# Patient Record
Sex: Female | Born: 1935 | Race: Black or African American | Hispanic: No | Smoking: Former smoker
Health system: Southern US, Community
[De-identification: ages and names within clinical notes are randomized; demographics above are authoritative.]

## PROBLEM LIST (undated history)

## (undated) DIAGNOSIS — Z95818 Presence of other cardiac implants and grafts: Secondary | ICD-10-CM

## (undated) DIAGNOSIS — Z9889 Other specified postprocedural states: Secondary | ICD-10-CM

## (undated) DIAGNOSIS — I5022 Chronic systolic (congestive) heart failure: Secondary | ICD-10-CM

## (undated) DIAGNOSIS — Z952 Presence of prosthetic heart valve: Secondary | ICD-10-CM

## (undated) DIAGNOSIS — I4891 Unspecified atrial fibrillation: Secondary | ICD-10-CM

## (undated) DIAGNOSIS — I2510 Atherosclerotic heart disease of native coronary artery without angina pectoris: Secondary | ICD-10-CM

## (undated) HISTORY — PX: AORTIC VALVE REPLACEMENT: SHX41

## (undated) HISTORY — DX: Atherosclerotic heart disease of native coronary artery without angina pectoris: I25.10

## (undated) HISTORY — PX: MITRAL VALVE REPAIR: SHX2039

## (undated) HISTORY — PX: CORONARY ARTERY BYPASS GRAFT: SHX141

---

## 2005-05-12 ENCOUNTER — Ambulatory Visit: Payer: Self-pay | Admitting: Family Medicine

## 2005-09-05 ENCOUNTER — Ambulatory Visit: Payer: Self-pay | Admitting: Family Medicine

## 2010-06-23 ENCOUNTER — Ambulatory Visit: Payer: Self-pay | Admitting: Podiatry

## 2012-11-24 ENCOUNTER — Emergency Department: Payer: Self-pay | Admitting: Emergency Medicine

## 2013-12-15 DIAGNOSIS — IMO0002 Reserved for concepts with insufficient information to code with codable children: Secondary | ICD-10-CM | POA: Insufficient documentation

## 2014-02-18 DIAGNOSIS — D649 Anemia, unspecified: Secondary | ICD-10-CM | POA: Insufficient documentation

## 2014-10-19 DIAGNOSIS — M7050 Other bursitis of knee, unspecified knee: Secondary | ICD-10-CM | POA: Insufficient documentation

## 2015-04-25 DIAGNOSIS — I340 Nonrheumatic mitral (valve) insufficiency: Secondary | ICD-10-CM | POA: Insufficient documentation

## 2015-06-17 ENCOUNTER — Encounter: Payer: Self-pay | Admitting: *Deleted

## 2015-06-17 ENCOUNTER — Encounter: Payer: Medicare Other | Attending: Internal Medicine | Admitting: *Deleted

## 2015-06-17 VITALS — Ht 68.0 in | Wt 149.2 lb

## 2015-06-17 DIAGNOSIS — I4891 Unspecified atrial fibrillation: Secondary | ICD-10-CM | POA: Insufficient documentation

## 2015-06-17 DIAGNOSIS — K089 Disorder of teeth and supporting structures, unspecified: Secondary | ICD-10-CM | POA: Insufficient documentation

## 2015-06-17 DIAGNOSIS — Z9889 Other specified postprocedural states: Secondary | ICD-10-CM | POA: Diagnosis not present

## 2015-06-17 NOTE — Patient Instructions (Signed)
Patient Instructions  Patient Details  Name: Cindy Weaver MRN: [id number] Date of Birth: Mar 24, 1926 Referring Provider:  Clyda Greener, MD  Below are the personal goals you chose as well as exercise and nutrition goals. Our goal is to help you keep on track towards obtaining and maintaining your goals. We will be discussing your progress on these goals with you throughout the program.  Initial Exercise Prescription:     Initial Exercise Prescription - 06/17/15 1500    Date of Initial Exercise Prescription   Date 06/17/15   Treadmill   MPH 1.2   Grade 0   Minutes 10   Recumbant Bike   Level 1   RPM 40   Watts 20   Minutes 10   NuStep   Level 2   Watts 40   Minutes 10   Arm Ergometer   Level 1   Watts 10   Minutes 10   REL-XR   Level 2   Watts 40   Minutes 10   Prescription Details   Frequency (times per week) 3   Duration Progress to 30 minutes of continuous aerobic without signs/symptoms of physical distress   Intensity   THRR REST +  20   Ratings of Perceived Exertion 11-15   Perceived Dyspnea 2-4   Progression Continue progressive overload as per policy without signs/symptoms or physical distress.   Resistance Training   Training Prescription Yes   Weight 2   Reps 10-15      Exercise Goals: Frequency: Be able to perform aerobic exercise three times per week working toward 3-5 days per week.  Intensity: Work with a perceived exertion of 11 (fairly light) - 15 (hard) as tolerated. Follow your new exercise prescription and watch for changes in prescription as you progress with the program. Changes will be reviewed with you when they are made.  Duration: You should be able to do 30 minutes of continuous aerobic exercise in addition to a 5 minute warm-up and a 5 minute cool-down routine.  Nutrition Goals: Your personal nutrition goals will be established when you do your nutrition analysis with the dietician.  The following are nutrition guidelines to  follow: Cholesterol < /day Sodium < /day Fiber: Women over 50 yrs - 21 grams per day  Personal Goals:     Personal Goals and Risk Factors at Admission - 06/17/15 1511    Personal Goals and Risk Factors on Admission   Increase Aerobic Exercise and Physical Activity Yes   Intervention While in program, learn and follow the exercise prescription taught. Start at a low level workload and increase workload after able to maintain previous level for 30 minutes. Increase time before increasing intensity.   Diabetes No   Hypertension Yes   Goal Participant will see blood pressure controlled within the values of 140/71mm/Hg or within value directed by their physician.   Intervention Provide nutrition & aerobic exercise along with prescribed medications to achieve BP 140/90 or less.   Lipids Yes   Goal Cholesterol controlled with medications as prescribed, with individualized exercise RX and with personalized nutrition plan. Value goals: LDL < , HDL > . Participant states understanding of desired cholesterol values and following prescriptions.   Intervention Provide nutrition & aerobic exercise along with prescribed medications to achieve LDL 70mg , HDL >40mg .      Tobacco Use Initial Evaluation: History  Smoking status  . Not on file  Smokeless tobacco  . Not on file    Copy of goals  given to participant.

## 2015-06-17 NOTE — Progress Notes (Signed)
Cardiac Individual Treatment Plan  Patient Details  Name: Cindy Weaver MRN: [id number] Date of Birth: 04-22-1926 Referring Provider:  Clyda Greener, MD  Initial Encounter Date: Date: 06/17/15  Visit Diagnosis: S/P mitral valve repair  Patient's Home Medications on Admission:  Current outpatient prescriptions:  .  aspirin 81 MG chewable tablet, Chew 81 mg by mouth., Disp: , Rfl:  .  furosemide (LASIX) 40 MG tablet, Take 80 mg by mouth., Disp: , Rfl:  .  losartan (COZAAR) 25 MG tablet, Take 25 mg by mouth., Disp: , Rfl:  .  metoprolol tartrate (LOPRESSOR) 25 MG tablet, 50mg  (2 tabs) twice daily.  Please dispense 25mg  tabs instead of 50mg  tabs, Disp: , Rfl:  .  potassium chloride (K-DUR) 10 MEQ tablet, Take by mouth., Disp: , Rfl:  .  triamcinolone cream (KENALOG) 0.1 %, , Disp: , Rfl:  .  warfarin (COUMADIN) 2.5 MG tablet, 3.75mg  (1.5 tabs) Mon, Wed, Fri and 2.5mg  (1 tab) other days or as directed  **Brand Coumadin, Disp: , Rfl:  .  Multiple Vitamins-Minerals (MULTIVITAMIN WITH MINERALS) tablet, Take by mouth., Disp: , Rfl:   Past Medical History: No past medical history on file.  Tobacco Use: History  Smoking status  . Not on file  Smokeless tobacco  . Not on file    Labs: Recent Review Flowsheet Data    There is no flowsheet data to display.       Exercise Target Goals: Date: 06/17/15  Exercise Program Goal: Individual exercise prescription set with THRR, safety & activity barriers. Participant demonstrates ability to understand and report RPE using BORG scale, to self-measure pulse accurately, and to acknowledge the importance of the exercise prescription.  Exercise Prescription Goal: Starting with aerobic activity 30 plus minutes a day, 3 days per week for initial exercise prescription. Provide home exercise prescription and guidelines that participant acknowledges understanding prior to discharge.  Activity Barriers & Risk Stratification:     Activity  Barriers & Risk Stratification - 06/17/15 1506    Activity Barriers & Risk Stratification   Activity Barriers Arthritis   Risk Stratification High      6 Minute Walk:     6 Minute Walk      06/17/15 1500       6 Minute Walk   Phase Initial     Distance 845 feet     Walk Time 6 minutes     Resting HR 95 bpm     Resting BP 112/70 mmHg     Max Ex. HR 105 bpm     Max Ex. BP 128/68 mmHg     RPE 13     Symptoms No        Initial Exercise Prescription:     Initial Exercise Prescription - 06/17/15 1500    Date of Initial Exercise Prescription   Date 06/17/15   Treadmill   MPH 1.2   Grade 0   Minutes 10   Recumbant Bike   Level 1   RPM 40   Watts 20   Minutes 10   NuStep   Level 2   Watts 40   Minutes 10   Arm Ergometer   Level 1   Watts 10   Minutes 10   REL-XR   Level 2   Watts 40   Minutes 10   Prescription Details   Frequency (times per week) 3   Duration Progress to 30 minutes of continuous aerobic without signs/symptoms of physical distress   Intensity  THRR REST +  20   Ratings of Perceived Exertion 11-15   Perceived Dyspnea 2-4   Progression Continue progressive overload as per policy without signs/symptoms or physical distress.   Resistance Training   Training Prescription Yes   Weight 2   Reps 10-15      Exercise Prescription Changes:   Discharge Exercise Prescription (Final Exercise Prescription Changes):   Nutrition:  Target Goals: Understanding of nutrition guidelines, daily intake of sodium 1500mg , cholesterol 200mg , calories 30% from fat and 7% or less from saturated fats, daily to have 5 or more servings of fruits and vegetables.  Biometrics:     Pre Biometrics - 06/17/15 1503    Pre Biometrics   Height 5\' 8"  (1.727 m)   Weight 149 lb 3.2 oz (67.677 kg)   Waist Circumference 35 inches   Hip Circumference 40 inches   Waist to Hip Ratio 0.88 %   BMI (Calculated) 22.7       Nutrition Therapy Plan and Nutrition  Goals:     Nutrition Therapy & Goals - 06/17/15 1509    Nutrition Therapy   Drug/Food Interactions Coumadin/Vit K  Lives alone. States money is not an issue but she is just not hungry much.  Has some ensure her daughter who lives out of state bought her.    Intervention Plan   Intervention Using nutrition plan and personal goals to gain a healthy nutrition lifestyle. Add exercise as prescribed.      Nutrition Discharge: Rate Your Plate Scores:   Nutrition Goals Re-Evaluation:   Psychosocial: Target Goals: Acknowledge presence or absence of depression, maximize coping skills, provide positive support system. Participant is able to verbalize types and ability to use techniques and skills needed for reducing stress and depression.  Initial Review & Psychosocial Screening:   Quality of Life Scores:   PHQ-9:     Recent Review Flowsheet Data    Depression screen [facility] 2/9 06/17/2015   Decreased Interest 1   Down, Depressed, Hopeless 0   PHQ - 2 Score 1   Altered sleeping 1   Tired, decreased energy 3   Change in appetite 2   Feeling bad or failure about yourself  0   Trouble concentrating 0   Moving slowly or fidgety/restless 0   Suicidal thoughts 0   PHQ-9 Score 7   Difficult doing work/chores Not difficult at all      Psychosocial Evaluation and Intervention:   Psychosocial Re-Evaluation:   Vocational Rehabilitation: Provide vocational rehab assistance to qualifying candidates.   Vocational Rehab Evaluation & Intervention:     Vocational Rehab - 06/17/15 1507    Initial Vocational Rehab Evaluation & Intervention   Assessment shows need for Vocational Rehabilitation No      Education: Education Goals: Education classes will be provided on a weekly basis, covering required topics. Participant will state understanding/return demonstration of topics presented.  Learning Barriers/Preferences:     Learning Barriers/Preferences - 06/17/15 1506    Learning  Barriers/Preferences   Learning Barriers None   Learning Preferences None      Education Topics: General Nutrition Guidelines/Fats and Fiber: -Group instruction provided by verbal, written material, models and posters to present the general guidelines for heart healthy nutrition. Gives an explanation and review of dietary fats and fiber.   Controlling Sodium/Reading Food Labels: -Group verbal and written material supporting the discussion of sodium use in heart healthy nutrition. Review and explanation with models, verbal and written materials for utilization of the food label.  Exercise Physiology & Risk Factors: - Group verbal and written instruction with models to review the exercise physiology of the cardiovascular system and associated critical values. Details cardiovascular disease risk factors and the goals associated with each risk factor.   Aerobic Exercise & Resistance Training: - Gives group verbal and written discussion on the health impact of inactivity. On the components of aerobic and resistive training programs and the benefits of this training and how to safely progress through these programs.   Flexibility, Balance, General Exercise Guidelines: - Provides group verbal and written instruction on the benefits of flexibility and balance training programs. Provides general exercise guidelines with specific guidelines to those with heart or lung disease. Demonstration and skill practice provided.   Stress Management: - Provides group verbal and written instruction about the health risks of elevated stress, cause of high stress, and healthy ways to reduce stress.   Depression: - Provides group verbal and written instruction on the correlation between heart/lung disease and depressed mood, treatment options, and the stigmas associated with seeking treatment.   Anatomy & Physiology of the Heart: - Group verbal and written instruction and models provide basic cardiac  anatomy and physiology, with the coronary electrical and arterial systems. Review of: AMI, Angina, Valve disease, Heart Failure, Cardiac Arrhythmia, Pacemakers, and the ICD.   Cardiac Procedures: - Group verbal and written instruction and models to describe the testing methods done to diagnose heart disease. Reviews the outcomes of the test results. Describes the treatment choices: Medical Management, Angioplasty, or Coronary Bypass Surgery.   Cardiac Medications: - Group verbal and written instruction to review commonly prescribed medications for heart disease. Reviews the medication, class of the drug, and side effects. Includes the steps to properly store meds and maintain the prescription regimen.   Go Sex-Intimacy & Heart Disease, Get SMART - Goal Setting: - Group verbal and written instruction through game format to discuss heart disease and the return to sexual intimacy. Provides group verbal and written material to discuss and apply goal setting through the application of the S.M.A.R.T. Method.   Other Matters of the Heart: - Provides group verbal, written materials and models to describe Heart Failure, Angina, Valve Disease, and Diabetes in the realm of heart disease. Includes description of the disease process and treatment options available to the cardiac patient.   Exercise & Equipment Safety: - Individual verbal instruction and demonstration of equipment use and safety with use of the equipment.          Cardiac Rehab from 06/17/2015 in [facility] Cardiac Rehab   Date  06/17/15   Educator  C. Ayva Veilleux,RN   Instruction Review Code  2- meets goals/outcomes      Infection Prevention: - Provides verbal and written material to individual with discussion of infection control including proper hand washing and proper equipment cleaning during exercise session.      Cardiac Rehab from 06/17/2015 in [facility] Cardiac Rehab   Date  06/17/15   Educator  C. Nohea Kras,RN   Instruction Review Code   2- meets goals/outcomes      Falls Prevention: - Provides verbal and written material to individual with discussion of falls prevention and safety.      Cardiac Rehab from 06/17/2015 in [facility] Cardiac Rehab   Date  06/17/15   Educator  C. EnterkinRN   Instruction Review Code  2- meets goals/outcomes      Diabetes: - Individual verbal and written instruction to review signs/symptoms of diabetes, desired ranges of glucose level fasting,  after meals and with exercise. Advice that pre and post exercise glucose checks will be done for 3 sessions at entry of program.    Knowledge Questionnaire Score:     Knowledge Questionnaire Score - 06/17/15 1506    Knowledge Questionnaire Score   Pre Score 20      Personal Goals and Risk Factors at Admission:     Personal Goals and Risk Factors at Admission - 06/17/15 1511    Personal Goals and Risk Factors on Admission   Increase Aerobic Exercise and Physical Activity Yes   Intervention While in program, learn and follow the exercise prescription taught. Start at a low level workload and increase workload after able to maintain previous level for 30 minutes. Increase time before increasing intensity.   Diabetes No   Hypertension Yes   Goal Participant will see blood pressure controlled within the values of 140/41mm/Hg or within value directed by their physician.   Intervention Provide nutrition & aerobic exercise along with prescribed medications to achieve BP 140/90 or less.   Lipids Yes   Goal Cholesterol controlled with medications as prescribed, with individualized exercise RX and with personalized nutrition plan. Value goals: LDL < 70mg , HDL > 40mg . Participant states understanding of desired cholesterol values and following prescriptions.   Intervention Provide nutrition & aerobic exercise along with prescribed medications to achieve LDL 70mg , HDL >40mg .      Personal Goals and Risk Factors Review:    Personal Goals Discharge:      Comments: Ready to get her strength back. Walks with no walker or cane. Lives by herself. Her 2 daughters live out of state.

## 2015-06-23 ENCOUNTER — Encounter: Payer: Medicare Other | Admitting: *Deleted

## 2015-06-23 DIAGNOSIS — Z9889 Other specified postprocedural states: Secondary | ICD-10-CM | POA: Diagnosis not present

## 2015-06-23 NOTE — Progress Notes (Signed)
Daily Session Note  Patient Details  Name: Cindy Weaver MRN: [id number] Date of Birth: [birth year] Referring Provider:  Alison Stalling, MD  Encounter Date: 06/23/2015  Check In:     Session Check In - 06/23/15 1613    Check-In   Staff Present Heath Lark RN, BSN, CCRP;Renee Dillard Essex MS, ACSM CEP Exercise Physiologist;Diane Mariana Arn, BSN   ER physicians immediately available to respond to emergencies See telemetry face sheet for immediately available ER MD   Medication changes reported     No   Fall or balance concerns reported    No   Warm-up and Cool-down Performed on first and last piece of equipment   VAD Patient? No   Pain Assessment   Currently in Pain? No/denies         Goals Met:  Proper associated with RPD/PD & O2 Sat Exercise tolerated well No report of cardiac concerns or symptoms Strength training completed today  Goals Unmet:  Not Applicable  Goals Comments:    Dr. Emily Filbert is Medical Director for [place] [facility] and [facility].

## 2015-06-24 DIAGNOSIS — Z9889 Other specified postprocedural states: Secondary | ICD-10-CM

## 2015-06-24 NOTE — Progress Notes (Signed)
Daily Session Note  Patient Details  Name: Cindy Weaver MRN: [id number] Date of Birth: Sep 06, 1926 Referring Provider:  Alison Stalling, MD  Encounter Date: 06/24/2015  Check In:     Session Check In - 06/24/15 1633    Check-In   Staff Present Nyoka Cowden RN;Diane Joya Gaskins RN, BSN;Sherhonda Gaspar BS, ACSM EP-C, Exercise Physiologist   ER physicians immediately available to respond to emergencies See telemetry face sheet for immediately available ER MD   Medication changes reported     No   Fall or balance concerns reported    No   Warm-up and Cool-down Performed on first and last piece of equipment   VAD Patient? No   Pain Assessment   Currently in Pain? No/denies         Goals Met:  Proper associated with RPD/PD & O2 Sat Exercise tolerated well No report of cardiac concerns or symptoms Strength training completed today  Goals Unmet:  Not Applicable  Goals Comments:    Dr. Emily Filbert is Medical Director for [place] and [facility].

## 2015-06-28 ENCOUNTER — Encounter: Payer: Self-pay | Admitting: *Deleted

## 2015-06-28 ENCOUNTER — Encounter: Payer: Medicare Other | Admitting: *Deleted

## 2015-06-28 DIAGNOSIS — Z9889 Other specified postprocedural states: Secondary | ICD-10-CM | POA: Diagnosis not present

## 2015-06-28 NOTE — Progress Notes (Signed)
Daily Session Note  Patient Details  Name: Cindy Weaver MRN: [id number] Date of Birth: 06/01/1926 Referring Provider:  Vavalle, John P, MD  Encounter Date: 06/28/2015  Check In:     Session Check In - 06/28/15 1610    Check-In   Staff Present Susanne Bice RN, BSN, CCRP;Carroll Enterkin RN, BSN;Steven Way BS, ACSM EP-C, Exercise Physiologist   ER physicians immediately available to respond to emergencies See telemetry face sheet for immediately available ER MD   Medication changes reported     No   Fall or balance concerns reported    No   Warm-up and Cool-down Performed on first and last piece of equipment   VAD Patient? No   Pain Assessment   Currently in Pain? No/denies           Exercise Prescription Changes - 06/28/15 0600    Exercise Review   Progression Yes   Response to Exercise   Blood Pressure (Admit) 126/62 mmHg   Blood Pressure (Exercise) 136/72 mmHg   Blood Pressure (Exit) 140/70 mmHg   Heart Rate (Admit) 82 bpm   Heart Rate (Exercise) 95 bpm   Heart Rate (Exit) 74 bpm   Rating of Perceived Exertion (Exercise) 13   Duration Progress to 30 minutes of continuous aerobic without signs/symptoms of physical distress   Intensity Rest + 30   Progression Continue progressive overload as per policy without signs/symptoms or physical distress.   Resistance Training   Training Prescription Yes   Weight 2   Reps 10-15   Interval Training   Interval Training No   NuStep   Level 3   Watts 30   Minutes 10   REL-XR   Level 2   Watts 30   Minutes 15      Goals Met:  Exercise tolerated well No report of cardiac concerns or symptoms  Goals Unmet:  Not Applicable  Goals Comments: Cindy Weaver is doing  well with exercise prescription progression. She has made several changes in her daily nutrition plan, adding more fruits/vegs, switching to brown bread and stopping sodas.    Dr. Mark Miller is Medical Director for [facility] and  [facility]. 

## 2015-06-30 ENCOUNTER — Encounter: Payer: Self-pay | Admitting: *Deleted

## 2015-06-30 ENCOUNTER — Encounter: Payer: Medicare Other | Admitting: *Deleted

## 2015-06-30 DIAGNOSIS — Z9889 Other specified postprocedural states: Secondary | ICD-10-CM

## 2015-06-30 NOTE — Progress Notes (Signed)
Cardiac Individual Treatment Plan  Patient Details  Name: Cindy Weaver MRN: [id number] Date of Birth: [birth year] Referring Provider:  Alison Stalling, MD  Initial Encounter Date:    Visit Diagnosis: S/P mitral valve repair  Patient's Home Medications on Admission:  Current outpatient prescriptions:  .  aspirin 81 MG chewable tablet, Chew 81 mg by mouth., Disp: , Rfl:  .  furosemide (LASIX) 40 MG tablet, Take 80 mg by mouth., Disp: , Rfl:  .  losartan (COZAAR) 25 MG tablet, Take 25 mg by mouth., Disp: , Rfl:  .  metoprolol tartrate (LOPRESSOR) 25 MG tablet, 10m (2 tabs) twice daily.  Please dispense 211mtabs instead of 5028mabs, Disp: , Rfl:  .  Multiple Vitamins-Minerals (MULTIVITAMIN WITH MINERALS) tablet, Take by mouth., Disp: , Rfl:  .  potassium chloride (K-DUR) 10 MEQ tablet, Take by mouth., Disp: , Rfl:  .  triamcinolone cream (KENALOG) 0.1 %, , Disp: , Rfl:  .  warfarin (COUMADIN) 2.5 MG tablet, 3.39m103m.5 tabs) Mon, Wed, Fri and 2.5mg 82mtab) other days or as directed  **Brand Coumadin, Disp: , Rfl:   Past Medical History: Past Medical History  Diagnosis Date  . CHF (congestive heart failure)   . Coronary artery disease   . Hypertension     Tobacco Use: History  Smoking status  . Former Smoker -- 1.00 packs/day for 25 years  . Quit date: 10/16/1993  Smokeless tobacco  . Not on file    Labs: Recent Review Flowsheet Data    There is no flowsheet data to display.       Exercise Target Goals:    Exercise Program Goal: Individual exercise prescription set with THRR, safety & activity barriers. Participant demonstrates ability to understand and report RPE using BORG scale, to self-measure pulse accurately, and to acknowledge the importance of the exercise prescription.  Exercise Prescription Goal: Starting with aerobic activity 30 plus minutes a day, 3 days per week for initial exercise prescription. Provide home exercise prescription and guidelines that  participant acknowledges understanding prior to discharge.  Activity Barriers & Risk Stratification:     Activity Barriers & Risk Stratification - 06/17/15 1506    Activity Barriers & Risk Stratification   Activity Barriers Arthritis   Risk Stratification High      6 Minute Walk:     6 Minute Walk      06/17/15 1500       6 Minute Walk   Phase Initial     Distance 845 feet     Walk Time 6 minutes     Resting HR 95 bpm     Resting BP 112/70 mmHg     Max Ex. HR 105 bpm     Max Ex. BP 128/68 mmHg     RPE 13     Symptoms No        Initial Exercise Prescription:     Initial Exercise Prescription - 06/17/15 1500    Date of Initial Exercise Prescription   Date 06/17/15   Treadmill   MPH 1.2   Grade 0   Minutes 10   Recumbant Bike   Level 1   RPM 40   Watts 20   Minutes 10   NuStep   Level 2   Watts 40   Minutes 10   Arm Ergometer   Level 1   Watts 10   Minutes 10   REL-XR   Level 2   Watts 40   Minutes 10  Prescription Details   Frequency (times per week) 3   Duration Progress to 30 minutes of continuous aerobic without signs/symptoms of physical distress   Intensity   THRR REST +  20   Ratings of Perceived Exertion 11-15   Perceived Dyspnea 2-4   Progression Continue progressive overload as per policy without signs/symptoms or physical distress.   Resistance Training   Training Prescription Yes   Weight 2   Reps 10-15      Exercise Prescription Changes:     Exercise Prescription Changes      06/28/15 0600           Exercise Review   Progression Yes       Response to Exercise   Blood Pressure (Admit) 126/62 mmHg       Blood Pressure (Exercise) 136/72 mmHg       Blood Pressure (Exit) 140/70 mmHg       Heart Rate (Admit) 82 bpm       Heart Rate (Exercise) 95 bpm       Heart Rate (Exit) 74 bpm       Rating of Perceived Exertion (Exercise) 13       Duration Progress to 30 minutes of continuous aerobic without signs/symptoms of  physical distress       Intensity Rest + 30       Progression Continue progressive overload as per policy without signs/symptoms or physical distress.       Resistance Training   Training Prescription Yes       Weight 2       Reps 10-15       Interval Training   Interval Training No       NuStep   Level 3       Watts 30       Minutes 10       REL-XR   Level 2       Watts 30       Minutes 15          Discharge Exercise Prescription (Final Exercise Prescription Changes):     Exercise Prescription Changes - 06/28/15 0600    Exercise Review   Progression Yes   Response to Exercise   Blood Pressure (Admit) 126/62 mmHg   Blood Pressure (Exercise) 136/72 mmHg   Blood Pressure (Exit) 140/70 mmHg   Heart Rate (Admit) 82 bpm   Heart Rate (Exercise) 95 bpm   Heart Rate (Exit) 74 bpm   Rating of Perceived Exertion (Exercise) 13   Duration Progress to 30 minutes of continuous aerobic without signs/symptoms of physical distress   Intensity Rest + 30   Progression Continue progressive overload as per policy without signs/symptoms or physical distress.   Resistance Training   Training Prescription Yes   Weight 2   Reps 10-15   Interval Training   Interval Training No   NuStep   Level 3   Watts 30   Minutes 10   REL-XR   Level 2   Watts 30   Minutes 15      Nutrition:  Target Goals: Understanding of nutrition guidelines, daily intake of sodium <155m, cholesterol <2029m calories 30% from fat and 7% or less from saturated fats, daily to have 5 or more servings of fruits and vegetables.  Biometrics:     Pre Biometrics - 06/17/15 1503    Pre Biometrics   Height 5' 8"  (1.727 m)   Weight 149 lb 3.2 oz (67.677 kg)   Waist  Circumference 35 inches   Hip Circumference 40 inches   Waist to Hip Ratio 0.88 %   BMI (Calculated) 22.7       Nutrition Therapy Plan and Nutrition Goals:     Nutrition Therapy & Goals - 06/25/15 1050    Nutrition Therapy   Diet basic diet  for poor appetite, prevent further weight loss   Drug/Food Interactions Coumadin/Vit K   Personal Nutrition Goals   Personal Goal #1 Try 2% or "light" cheese, or low fat cream cheese (for protein)   Personal Goal #2 Can also try Premier Protein drinks or Carnation breakfast drinks   Personal Goal #3 Use peanut butter as another way to take in protein   Personal Goal #4 Eat something - or have a protein drink - every 3-4 hours during the day      Nutrition Discharge: Rate Your Plate Scores:   Nutrition Goals Re-Evaluation:   Psychosocial: Target Goals: Acknowledge presence or absence of depression, maximize coping skills, provide positive support system. Participant is able to verbalize types and ability to use techniques and skills needed for reducing stress and depression.  Initial Review & Psychosocial Screening:   Quality of Life Scores:     Quality of Life - 06/17/15 1750    Quality of Life Scores   Health/Function Pre 19.11 %   Socioeconomic Pre 23.17 %   Psych/Spiritual Pre 23.79 %   Family Pre 23.38 %   GLOBAL Pre 21.5 %      PHQ-9:     Recent Review Flowsheet Data    Depression screen [facility] 2/9 06/17/2015   Decreased Interest 1   Down, Depressed, Hopeless 0   PHQ - 2 Score 1   Altered sleeping 1   Tired, decreased energy 3   Change in appetite 2   Feeling bad or failure about yourself  0   Trouble concentrating 0   Moving slowly or fidgety/restless 0   Suicidal thoughts 0   PHQ-9 Score 7   Difficult doing work/chores Not difficult at all      Psychosocial Evaluation and Intervention:     Psychosocial Evaluation - 06/23/15 1718    Psychosocial Evaluation & Interventions   Interventions Stress management education;Relaxation education;Encouraged to exercise with the program and follow exercise prescription   Comments Counselor met with Ms. Overfield today.  She is an 76 year old who recently had heart surgery in July.  Ms. Jerilynn Mages is a widow as of February this  year when her spouse of 88 years passed away.  She currently lives alone, but reports having a sister, nieces and a strong connection to her church community as her support system.  Ms. Jerilynn Mages reports she is sleeping better since her Dr. recommended Tylenol PM, but she has minimal appetite lately.  She denies a history of depression or anxiety symptoms or any current symptoms, other than occasional sadness or loneliness since her spouse passed.  Ms. Jerilynn Mages is typically in a pleasant mood and states she has minimal stress in her life at this time.  Her goals for this program are to increase her energy and her stamina.   Continued Psychosocial Services Needed Yes  Ms. M will benefit from the psychoeducational components of this program, such as depression and relaxation.      Psychosocial Re-Evaluation:   Vocational Rehabilitation: Provide vocational rehab assistance to qualifying candidates.   Vocational Rehab Evaluation & Intervention:     Vocational Rehab - 06/17/15 1507    Initial Vocational  Rehab Evaluation & Intervention   Assessment shows need for Vocational Rehabilitation No      Education: Education Goals: Education classes will be provided on a weekly basis, covering required topics. Participant will state understanding/return demonstration of topics presented.  Learning Barriers/Preferences:     Learning Barriers/Preferences - 06/17/15 1506    Learning Barriers/Preferences   Learning Barriers None   Learning Preferences None      Education Topics: General Nutrition Guidelines/Fats and Fiber: -Group instruction provided by verbal, written material, models and posters to present the general guidelines for heart healthy nutrition. Gives an explanation and review of dietary fats and fiber.   Controlling Sodium/Reading Food Labels: -Group verbal and written material supporting the discussion of sodium use in heart healthy nutrition. Review and explanation with models, verbal and  written materials for utilization of the food label.   Exercise Physiology & Risk Factors: - Group verbal and written instruction with models to review the exercise physiology of the cardiovascular system and associated critical values. Details cardiovascular disease risk factors and the goals associated with each risk factor.          Cardiac Rehab from 06/28/2015 in [facility] Cardiac Rehab   Date  06/23/15   Educator  RM   Instruction Review Code  2- meets goals/outcomes      Aerobic Exercise & Resistance Training: - Gives group verbal and written discussion on the health impact of inactivity. On the components of aerobic and resistive training programs and the benefits of this training and how to safely progress through these programs.      Cardiac Rehab from 06/28/2015 in [facility] Cardiac Rehab   Date  06/28/15   Educator  SW   Instruction Review Code  2- meets goals/outcomes      Flexibility, Balance, General Exercise Guidelines: - Provides group verbal and written instruction on the benefits of flexibility and balance training programs. Provides general exercise guidelines with specific guidelines to those with heart or lung disease. Demonstration and skill practice provided.   Stress Management: - Provides group verbal and written instruction about the health risks of elevated stress, cause of high stress, and healthy ways to reduce stress.   Depression: - Provides group verbal and written instruction on the correlation between heart/lung disease and depressed mood, treatment options, and the stigmas associated with seeking treatment.   Anatomy & Physiology of the Heart: - Group verbal and written instruction and models provide basic cardiac anatomy and physiology, with the coronary electrical and arterial systems. Review of: AMI, Angina, Valve disease, Heart Failure, Cardiac Arrhythmia, Pacemakers, and the ICD.   Cardiac Procedures: - Group verbal and written instruction and  models to describe the testing methods done to diagnose heart disease. Reviews the outcomes of the test results. Describes the treatment choices: Medical Management, Angioplasty, or Coronary Bypass Surgery.   Cardiac Medications: - Group verbal and written instruction to review commonly prescribed medications for heart disease. Reviews the medication, class of the drug, and side effects. Includes the steps to properly store meds and maintain the prescription regimen.   Go Sex-Intimacy & Heart Disease, Get SMART - Goal Setting: - Group verbal and written instruction through game format to discuss heart disease and the return to sexual intimacy. Provides group verbal and written material to discuss and apply goal setting through the application of the S.M.A.R.T. Method.   Other Matters of the Heart: - Provides group verbal, written materials and models to describe Heart Failure, Angina, Valve Disease, and Diabetes  in the realm of heart disease. Includes description of the disease process and treatment options available to the cardiac patient.   Exercise & Equipment Safety: - Individual verbal instruction and demonstration of equipment use and safety with use of the equipment.      Cardiac Rehab from 06/28/2015 in [facility] Cardiac Rehab   Date  06/17/15   Educator  C. Enterkin,RN   Instruction Review Code  2- meets goals/outcomes      Infection Prevention: - Provides verbal and written material to individual with discussion of infection control including proper hand washing and proper equipment cleaning during exercise session.      Cardiac Rehab from 06/28/2015 in [facility] Cardiac Rehab   Date  06/17/15   Educator  C. Enterkin,RN   Instruction Review Code  2- meets goals/outcomes      Falls Prevention: - Provides verbal and written material to individual with discussion of falls prevention and safety.      Cardiac Rehab from 06/28/2015 in [facility] Cardiac Rehab   Date  06/17/15   Educator  C.  [place]   Instruction Review Code  2- meets goals/outcomes      Diabetes: - Individual verbal and written instruction to review signs/symptoms of diabetes, desired ranges of glucose level fasting, after meals and with exercise. Advice that pre and post exercise glucose checks will be done for 3 sessions at entry of program.    Knowledge Questionnaire Score:     Knowledge Questionnaire Score - 06/17/15 1506    Knowledge Questionnaire Score   Pre Score 20      Personal Goals and Risk Factors at Admission:     Personal Goals and Risk Factors at Admission - 06/17/15 1511    Personal Goals and Risk Factors on Admission   Increase Aerobic Exercise and Physical Activity Yes   Intervention While in program, learn and follow the exercise prescription taught. Start at a low level workload and increase workload after able to maintain previous level for 30 minutes. Increase time before increasing intensity.   Diabetes No   Hypertension Yes   Goal Participant will see blood pressure controlled within the values of 140/44m/Hg or within value directed by their physician.   Intervention Provide nutrition & aerobic exercise along with prescribed medications to achieve BP 140/90 or less.   Lipids Yes   Goal Cholesterol controlled with medications as prescribed, with individualized exercise RX and with personalized nutrition plan. Value goals: LDL < 714m HDL > 4055mParticipant states understanding of desired cholesterol values and following prescriptions.   Intervention Provide nutrition & aerobic exercise along with prescribed medications to achieve LDL <23m78mDL >40mg75m   Personal Goals and Risk Factors Review:      Goals and Risk Factor Review      06/28/15 1802           Weight Management   Goals Progress/Improvement seen No       Comments HAs not seen pounds change, maybe slight change in lclothing.   Is working on nutrition changes: More fruits, brown bread, no more sodas.        Increase Aerobic Exercise and Physical Activity   Goals Progress/Improvement seen  Yes       Comments continues to progress with her exercise program       Understand more about Heart/Pulmonary Disease   Goals Progress/Improvement seen  Yes       Comments States is learning alot, wishes her husband was able to  attend,but his schedule conflicts with the class time.          Personal Goals Discharge:     Comments: 30 day review Continue with Current ITP

## 2015-06-30 NOTE — Progress Notes (Signed)
Daily Session Note  Patient Details  Name: [name] MRN: [id number] Date of Birth: [birth year] Referring Provider:  Alison Stalling, MD  Encounter Date: 06/30/2015  Check In:     Session Check In - 06/30/15 1647    Check-In   Staff Present Nyoka Cowden RN;Carroll Enterkin RN, Drusilla Kanner MS, ACSM CEP Exercise Physiologist   ER physicians immediately available to respond to emergencies See telemetry face sheet for immediately available ER MD   Medication changes reported     No   Fall or balance concerns reported    No   Warm-up and Cool-down Performed on first and last piece of equipment   VAD Patient? No   Pain Assessment   Currently in Pain? No/denies   Multiple Pain Sites No           Exercise Prescription Changes - 06/30/15 1600    Exercise Review   Progression Yes   Response to Exercise   Blood Pressure (Admit) 126/62 mmHg   Blood Pressure (Exercise) 136/72 mmHg   Blood Pressure (Exit) 140/70 mmHg   Heart Rate (Admit) 82 bpm   Heart Rate (Exercise) 95 bpm   Heart Rate (Exit) 74 bpm   Rating of Perceived Exertion (Exercise) 13   Duration Progress to 30 minutes of continuous aerobic without signs/symptoms of physical distress   Intensity Rest + 30   Progression Continue progressive overload as per policy without signs/symptoms or physical distress.   Resistance Training   Training Prescription Yes   Weight 2   Reps 10-15   Interval Training   Interval Training No   Treadmill   MPH 1.2   Grade 0   Minutes 3   NuStep   Level 3   Watts 30   Minutes 10   REL-XR   Level 2   Watts 30   Minutes 15      Goals Met:  Independence with exercise equipment Exercise tolerated well Personal goals reviewed No report of cardiac concerns or symptoms Strength training completed today  Goals Unmet:  Not Applicable  Goals Comments: [name] tried the treadmill for the first time today and did very well. She went for 3 minutes and we will slowly  increase that time over the course of the program.    Dr. Emily Filbert is Medical Director for [place] and [facility].

## 2015-07-01 DIAGNOSIS — Z9889 Other specified postprocedural states: Secondary | ICD-10-CM | POA: Diagnosis not present

## 2015-07-01 NOTE — Progress Notes (Signed)
Daily Session Note  Patient Details  Name: Cindy Weaver MRN: [id number] Date of Birth: 06/13/1926 Referring Provider:  Vavalle, John P, MD  Encounter Date: 07/01/2015  Check In:     Session Check In - 07/01/15 1658    Check-In   Staff Present Mary Jo Abernethy RN;Carroll Enterkin RN, BSN;Steven Way BS, ACSM EP-C, Exercise Physiologist   ER physicians immediately available to respond to emergencies See telemetry face sheet for immediately available ER MD   Medication changes reported     No   Fall or balance concerns reported    No   Warm-up and Cool-down Performed on first and last piece of equipment   VAD Patient? No   Pain Assessment   Currently in Pain? No/denies         Goals Met:  Proper associated with RPD/PD & O2 Sat Exercise tolerated well No report of cardiac concerns or symptoms Strength training completed today  Goals Unmet:  Not Applicable  Goals Comments:    Dr. Mark Miller is Medical Director for [facility] and [facility]. 

## 2015-07-05 ENCOUNTER — Encounter: Payer: Medicare Other | Admitting: *Deleted

## 2015-07-05 DIAGNOSIS — Z9889 Other specified postprocedural states: Secondary | ICD-10-CM | POA: Diagnosis not present

## 2015-07-05 NOTE — Progress Notes (Signed)
Daily Session Note  Patient Details  Name: Cindy Weaver MRN: [id number] Date of Birth: 10/07/1926 Referring Provider:  Alison Stalling, MD  Encounter Date: 07/05/2015  Check In:     Session Check In - 07/05/15 1735    Check-In   Staff Present Lestine Box BS, ACSM EP-C, Exercise Physiologist;Kelly Alfonso Patten, ACSM CEP Exercise Physiologist;Susanne Bice RN, BSN, [place]   ER physicians immediately available to respond to emergencies See telemetry face sheet for immediately available ER MD   Medication changes reported     No   Fall or balance concerns reported    No   Warm-up and Cool-down Performed on first and last piece of equipment   VAD Patient? No   Pain Assessment   Currently in Pain? No/denies   Multiple Pain Sites No         Goals Met:  Independence with exercise equipment Exercise tolerated well No report of cardiac concerns or symptoms Strength training completed today  Goals Unmet:  Not Applicable  Goals Comments:    Dr. Emily Filbert is Medical Director for [place] and [facility].

## 2015-07-07 ENCOUNTER — Encounter: Payer: Medicare Other | Admitting: *Deleted

## 2015-07-07 DIAGNOSIS — Z9889 Other specified postprocedural states: Secondary | ICD-10-CM

## 2015-07-07 NOTE — Progress Notes (Signed)
Daily Session Note  Patient Details  Name: IRAM ASTORINO MRN: [id number] Date of Birth: Feb 22, 1926 Referring Provider:  Alison Stalling, MD  Encounter Date: 07/07/2015  Check In:     Session Check In - 07/07/15 1610    Check-In   Staff Present Gerlene Burdock RN, BSN;Diane Joya Gaskins RN, BSN;Renee Dillard [place] MS, ACSM CEP Exercise Physiologist   ER physicians immediately available to respond to emergencies See telemetry face sheet for immediately available ER MD   Medication changes reported     No   Fall or balance concerns reported    No   Warm-up and Cool-down Performed on first and last piece of equipment   VAD Patient? No   Pain Assessment   Currently in Pain? No/denies   Multiple Pain Sites No         Goals Met:  Exercise tolerated well No report of cardiac concerns or symptoms Strength training completed today  Goals Unmet:  Not Applicable  Goals Comments:    Dr. Emily Filbert is Medical Director for [facility] and [facility].

## 2015-07-08 DIAGNOSIS — Z9889 Other specified postprocedural states: Secondary | ICD-10-CM | POA: Diagnosis not present

## 2015-07-08 NOTE — Progress Notes (Signed)
Daily Session Note  Patient Details  Name: Cindy Weaver MRN: [id number] Date of Birth: [birth year] Referring Provider:  Alison Stalling, MD  Encounter Date: 07/08/2015  Check In:     Session Check In - 07/08/15 1629    Check-In   Staff Present Lestine Box BS, ACSM EP-C, Exercise Physiologist;Carroll Enterkin RN, BSN;Diane Joya Gaskins RN, BSN   ER physicians immediately available to respond to emergencies See telemetry face sheet for immediately available ER MD   Medication changes reported     No   Fall or balance concerns reported    No   Warm-up and Cool-down Performed on first and last piece of equipment   VAD Patient? No   Pain Assessment   Currently in Pain? No/denies         Goals Met:  Proper associated with RPD/PD & O2 Sat Exercise tolerated well No report of cardiac concerns or symptoms Strength training completed today  Goals Unmet:  Not Applicable  Goals Comments:    Dr. Emily Filbert is Medical Director for [place] and [facility].

## 2015-07-19 ENCOUNTER — Encounter: Payer: Medicare Other | Attending: Internal Medicine

## 2015-07-19 DIAGNOSIS — Z9889 Other specified postprocedural states: Secondary | ICD-10-CM | POA: Insufficient documentation

## 2015-07-20 ENCOUNTER — Encounter: Payer: Self-pay | Admitting: *Deleted

## 2015-07-28 ENCOUNTER — Encounter: Payer: Self-pay | Admitting: *Deleted

## 2015-07-28 DIAGNOSIS — Z9889 Other specified postprocedural states: Secondary | ICD-10-CM

## 2015-07-28 NOTE — Progress Notes (Signed)
Cardiac Individual Treatment Plan  Patient Details  Name: Cindy Weaver MRN: [id number] Date of Birth: 05/29/1926 Referring Provider:  No ref. provider found  Initial Encounter Date:    Visit Diagnosis: S/P mitral valve repair  Patient's Home Medications on Admission:  Current outpatient prescriptions:  .  aspirin 81 MG chewable tablet, Chew 81 mg by mouth., Disp: , Rfl:  .  furosemide (LASIX) 40 MG tablet, Take 80 mg by mouth., Disp: , Rfl:  .  losartan (COZAAR) 25 MG tablet, Take 25 mg by mouth., Disp: , Rfl:  .  metoprolol tartrate (LOPRESSOR) 25 MG tablet, 46m (2 tabs) twice daily.  Please dispense 232mtabs instead of 5036mabs, Disp: , Rfl:  .  Multiple Vitamins-Minerals (MULTIVITAMIN WITH MINERALS) tablet, Take by mouth., Disp: , Rfl:  .  potassium chloride (K-DUR) 10 MEQ tablet, Take by mouth., Disp: , Rfl:  .  triamcinolone cream (KENALOG) 0.1 %, , Disp: , Rfl:  .  warfarin (COUMADIN) 2.5 MG tablet, 3.2m69m.5 tabs) Mon, Wed, Fri and 2.5mg 1mtab) other days or as directed  **Brand Coumadin, Disp: , Rfl:   Past Medical History: Past Medical History  Diagnosis Date  . CHF (congestive heart failure)   . Coronary artery disease   . Hypertension     Tobacco Use: History  Smoking status  . Former Smoker -- 1.00 packs/day for 25 years  . Quit date: 10/16/1993  Smokeless tobacco  . Not on file    Labs: Recent Review Flowsheet Data    There is no flowsheet data to display.       Exercise Target Goals:    Exercise Program Goal: Individual exercise prescription set with THRR, safety & activity barriers. Participant demonstrates ability to understand and report RPE using BORG scale, to self-measure pulse accurately, and to acknowledge the importance of the exercise prescription.  Exercise Prescription Goal: Starting with aerobic activity 30 plus minutes a day, 3 days per week for initial exercise prescription. Provide home exercise prescription and guidelines  that participant acknowledges understanding prior to discharge.  Activity Barriers & Risk Stratification:     Activity Barriers & Risk Stratification - 06/17/15 1506    Activity Barriers & Risk Stratification   Activity Barriers Arthritis   Risk Stratification High      6 Minute Walk:     6 Minute Walk      06/17/15 1500       6 Minute Walk   Phase Initial     Distance 845 feet     Walk Time 6 minutes     Resting HR 95 bpm     Resting BP 112/70 mmHg     Max Ex. HR 105 bpm     Max Ex. BP 128/68 mmHg     RPE 13     Symptoms No        Initial Exercise Prescription:     Initial Exercise Prescription - 06/17/15 1500    Date of Initial Exercise Prescription   Date 06/17/15   Treadmill   MPH 1.2   Grade 0   Minutes 10   Recumbant Bike   Level 1   RPM 40   Watts 20   Minutes 10   NuStep   Level 2   Watts 40   Minutes 10   Arm Ergometer   Level 1   Watts 10   Minutes 10   REL-XR   Level 2   Watts 40   Minutes 10  Prescription Details   Frequency (times per week) 3   Duration Progress to 30 minutes of continuous aerobic without signs/symptoms of physical distress   Intensity   THRR REST +  20   Ratings of Perceived Exertion 11-15   Perceived Dyspnea 2-4   Progression Continue progressive overload as per policy without signs/symptoms or physical distress.   Resistance Training   Training Prescription Yes   Weight 2   Reps 10-15      Exercise Prescription Changes:     Exercise Prescription Changes      06/28/15 0600 06/30/15 1600 07/20/15 1500       Exercise Review   Progression Yes Yes Yes     Response to Exercise   Blood Pressure (Admit) 126/62 mmHg 126/62 mmHg 118/62 mmHg     Blood Pressure (Exercise) 136/72 mmHg 136/72 mmHg 138/64 mmHg     Blood Pressure (Exit) 140/70 mmHg 140/70 mmHg 112/76 mmHg     Heart Rate (Admit) 82 bpm 82 bpm 85 bpm     Heart Rate (Exercise) 95 bpm 95 bpm 92 bpm     Heart Rate (Exit) 74 bpm 74 bpm 78 bpm      Rating of Perceived Exertion (Exercise) 13 13 13      Symptoms   None     Comments   Reviewed individualized exercise prescription and made increases per departmental policy. Exercise increases were discussed with the patient and they were able to perform the new work loads without issue (no signs or symptoms). Cindy Weaver makes progression on her own as well and she has been encouraged to continue with this.      Duration Progress to 30 minutes of continuous aerobic without signs/symptoms of physical distress Progress to 30 minutes of continuous aerobic without signs/symptoms of physical distress Progress to 30 minutes of continuous aerobic without signs/symptoms of physical distress     Intensity Rest + 30 Rest + 30 Rest + 30     Progression Continue progressive overload as per policy without signs/symptoms or physical distress. Continue progressive overload as per policy without signs/symptoms or physical distress. Continue progressive overload as per policy without signs/symptoms or physical distress.     Resistance Training   Training Prescription Yes Yes Yes     Weight 2 2 2      Reps 10-15 10-15 10-15     Interval Training   Interval Training No No No     Treadmill   MPH  1.2 1.2     Grade  0 0     Minutes  3 3     NuStep   Level 3 3 3      Watts 30 30 30      Minutes 10 10 10      REL-XR   Level 2 2 4      Watts 30 30 45     Minutes 15 15 25         Discharge Exercise Prescription (Final Exercise Prescription Changes):     Exercise Prescription Changes - 07/20/15 1500    Exercise Review   Progression Yes   Response to Exercise   Blood Pressure (Admit) 118/62 mmHg   Blood Pressure (Exercise) 138/64 mmHg   Blood Pressure (Exit) 112/76 mmHg   Heart Rate (Admit) 85 bpm   Heart Rate (Exercise) 92 bpm   Heart Rate (Exit) 78 bpm   Rating of Perceived Exertion (Exercise) 13   Symptoms None   Comments Reviewed individualized exercise prescription and made increases per departmental  policy. Exercise increases were discussed with the patient and they were able to perform the new work loads without issue (no signs or symptoms). Cindy Weaver makes progression on her own as well and she has been encouraged to continue with this.    Duration Progress to 30 minutes of continuous aerobic without signs/symptoms of physical distress   Intensity Rest + 30   Progression Continue progressive overload as per policy without signs/symptoms or physical distress.   Resistance Training   Training Prescription Yes   Weight 2   Reps 10-15   Interval Training   Interval Training No   Treadmill   MPH 1.2   Grade 0   Minutes 3   NuStep   Level 3   Watts 30   Minutes 10   REL-XR   Level 4   Watts 45   Minutes 25      Nutrition:  Target Goals: Understanding of nutrition guidelines, daily intake of sodium <1536m, cholesterol <2066m calories 30% from fat and 7% or less from saturated fats, daily to have 5 or more servings of fruits and vegetables.  Biometrics:     Pre Biometrics - 06/17/15 1503    Pre Biometrics   Height 5' 8"  (1.727 m)   Weight 149 lb 3.2 oz (67.677 kg)   Waist Circumference 35 inches   Hip Circumference 40 inches   Waist to Hip Ratio 0.88 %   BMI (Calculated) 22.7       Nutrition Therapy Plan and Nutrition Goals:     Nutrition Therapy & Goals - 06/25/15 1050    Nutrition Therapy   Diet basic diet for poor appetite, prevent further weight loss   Drug/Food Interactions Coumadin/Vit K   Personal Nutrition Goals   Personal Goal #1 Try 2% or "light" cheese, or low fat cream cheese (for protein)   Personal Goal #2 Can also try Premier Protein drinks or Carnation breakfast drinks   Personal Goal #3 Use peanut butter as another way to take in protein   Personal Goal #4 Eat something - or have a protein drink - every 3-4 hours during the day      Nutrition Discharge: Rate Your Plate Scores:   Nutrition Goals Re-Evaluation:     Nutrition Goals  Re-Evaluation      07/01/15 1630 07/08/15 1639         Personal Goal #1 Re-Evaluation   Personal Goal #1 LiZollie Beckersaid it was very helpful for her to meet with the dietician and she is following her suggestions. Discussed church or buffet choices.        Personal Goal #2 Re-Evaluation   Personal Goal #2  LiZollie Beckersaid she has been drinking Boost protein drinks and doing well with them.       Goal Progress Seen  Yes      Personal Goal #3 Re-Evaluation   Personal Goal #3  LiNyiesha Beevertates she has been eating peanut butter and jelly sandwhiches to get more protein.      Goal Progress Seen  Yes      Personal Goal #4 Re-Evaluation   Personal Goal #4  LiGrey Schlauchtates she is able to have some type of protein every 4 hrs.       Goal Progress Seen  Yes         Psychosocial: Target Goals: Acknowledge presence or absence of depression, maximize coping skills, provide positive support system. Participant is able to verbalize types and ability to use techniques  and skills needed for reducing stress and depression.  Initial Review & Psychosocial Screening:   Quality of Life Scores:     Quality of Life - 06/17/15 1750    Quality of Life Scores   Health/Function Pre 19.11 %   Socioeconomic Pre 23.17 %   Psych/Spiritual Pre 23.79 %   Family Pre 23.38 %   GLOBAL Pre 21.5 %      PHQ-9:     Recent Review Flowsheet Data    Depression screen [facility] 2/9 06/17/2015   Decreased Interest 1   Down, Depressed, Hopeless 0   PHQ - 2 Score 1   Altered sleeping 1   Tired, decreased energy 3   Change in appetite 2   Feeling bad or failure about yourself  0   Trouble concentrating 0   Moving slowly or fidgety/restless 0   Suicidal thoughts 0   PHQ-9 Score 7   Difficult doing work/chores Not difficult at all      Psychosocial Evaluation and Intervention:     Psychosocial Evaluation - 06/23/15 1718    Psychosocial Evaluation & Interventions   Interventions Stress management  education;Relaxation education;Encouraged to exercise with the program and follow exercise prescription   Comments Counselor met with Ms. Nuon today.  She is an 81 year old who recently had heart surgery in July.  Ms. Jerilynn Mages is a widow as of February this year when her spouse of 13 years passed away.  She currently lives alone, but reports having a sister, nieces and a strong connection to her church community as her support system.  Ms. Jerilynn Mages reports she is sleeping better since her Dr. recommended Tylenol PM, but she has minimal appetite lately.  She denies a history of depression or anxiety symptoms or any current symptoms, other than occasional sadness or loneliness since her spouse passed.  Ms. Jerilynn Mages is typically in a pleasant mood and states she has minimal stress in her life at this time.  Her goals for this program are to increase her energy and her stamina.   Continued Psychosocial Services Needed Yes  Ms. M will benefit from the psychoeducational components of this program, such as depression and relaxation.      Psychosocial Re-Evaluation:   Vocational Rehabilitation: Provide vocational rehab assistance to qualifying candidates.   Vocational Rehab Evaluation & Intervention:     Vocational Rehab - 06/17/15 1507    Initial Vocational Rehab Evaluation & Intervention   Assessment shows need for Vocational Rehabilitation No      Education: Education Goals: Education classes will be provided on a weekly basis, covering required topics. Participant will state understanding/return demonstration of topics presented.  Learning Barriers/Preferences:     Learning Barriers/Preferences - 06/17/15 1506    Learning Barriers/Preferences   Learning Barriers None   Learning Preferences None      Education Topics: General Nutrition Guidelines/Fats and Fiber: -Group instruction provided by verbal, written material, models and posters to present the general guidelines for heart healthy nutrition.  Gives an explanation and review of dietary fats and fiber.   Controlling Sodium/Reading Food Labels: -Group verbal and written material supporting the discussion of sodium use in heart healthy nutrition. Review and explanation with models, verbal and written materials for utilization of the food label.   Exercise Physiology & Risk Factors: - Group verbal and written instruction with models to review the exercise physiology of the cardiovascular system and associated critical values. Details cardiovascular disease risk factors and the goals associated with each risk  factor.          Cardiac Rehab from 07/07/2015 in [facility] Cardiac Rehab   Date  06/23/15   Educator  RM   Instruction Review Code  2- meets goals/outcomes      Aerobic Exercise & Resistance Training: - Gives group verbal and written discussion on the health impact of inactivity. On the components of aerobic and resistive training programs and the benefits of this training and how to safely progress through these programs.      Cardiac Rehab from 07/07/2015 in [facility] Cardiac Rehab   Date  06/28/15   Educator  SW   Instruction Review Code  2- meets goals/outcomes      Flexibility, Balance, General Exercise Guidelines: - Provides group verbal and written instruction on the benefits of flexibility and balance training programs. Provides general exercise guidelines with specific guidelines to those with heart or lung disease. Demonstration and skill practice provided.      Cardiac Rehab from 07/07/2015 in [facility] Cardiac Rehab   Date  07/05/15   Educator  SW   Instruction Review Code  2- meets goals/outcomes      Stress Management: - Provides group verbal and written instruction about the health risks of elevated stress, cause of high stress, and healthy ways to reduce stress.      Cardiac Rehab from 07/07/2015 in [facility] Cardiac Rehab   Date  07/07/15   Educator  Kathreen Cornfield   Instruction Review Code  2- meets goals/outcomes       Depression: - Provides group verbal and written instruction on the correlation between heart/lung disease and depressed mood, treatment options, and the stigmas associated with seeking treatment.   Anatomy & Physiology of the Heart: - Group verbal and written instruction and models provide basic cardiac anatomy and physiology, with the coronary electrical and arterial systems. Review of: AMI, Angina, Valve disease, Heart Failure, Cardiac Arrhythmia, Pacemakers, and the ICD.   Cardiac Procedures: - Group verbal and written instruction and models to describe the testing methods done to diagnose heart disease. Reviews the outcomes of the test results. Describes the treatment choices: Medical Management, Angioplasty, or Coronary Bypass Surgery.   Cardiac Medications: - Group verbal and written instruction to review commonly prescribed medications for heart disease. Reviews the medication, class of the drug, and side effects. Includes the steps to properly store meds and maintain the prescription regimen.   Go Sex-Intimacy & Heart Disease, Get SMART - Goal Setting: - Group verbal and written instruction through game format to discuss heart disease and the return to sexual intimacy. Provides group verbal and written material to discuss and apply goal setting through the application of the S.M.A.R.T. Method.   Other Matters of the Heart: - Provides group verbal, written materials and models to describe Heart Failure, Angina, Valve Disease, and Diabetes in the realm of heart disease. Includes description of the disease process and treatment options available to the cardiac patient.   Exercise & Equipment Safety: - Individual verbal instruction and demonstration of equipment use and safety with use of the equipment.      Cardiac Rehab from 07/07/2015 in [facility] Cardiac Rehab   Date  06/17/15   Educator  C. Rinnah Peppel,RN   Instruction Review Code  2- meets goals/outcomes      Infection  Prevention: - Provides verbal and written material to individual with discussion of infection control including proper hand washing and proper equipment cleaning during exercise session.      Cardiac Rehab from  07/07/2015 in [facility] Cardiac Rehab   Date  06/17/15   Educator  C. Ben Sanz,RN   Instruction Review Code  2- meets goals/outcomes      Falls Prevention: - Provides verbal and written material to individual with discussion of falls prevention and safety.      Cardiac Rehab from 07/07/2015 in [facility] Cardiac Rehab   Date  06/17/15   Educator  C. [place]   Instruction Review Code  2- meets goals/outcomes      Diabetes: - Individual verbal and written instruction to review signs/symptoms of diabetes, desired ranges of glucose level fasting, after meals and with exercise. Advice that pre and post exercise glucose checks will be done for 3 sessions at entry of program.    Knowledge Questionnaire Score:     Knowledge Questionnaire Score - 06/17/15 1506    Knowledge Questionnaire Score   Pre Score 20      Personal Goals and Risk Factors at Admission:     Personal Goals and Risk Factors at Admission - 06/17/15 1511    Personal Goals and Risk Factors on Admission   Increase Aerobic Exercise and Physical Activity Yes   Intervention While in program, learn and follow the exercise prescription taught. Start at a low level workload and increase workload after able to maintain previous level for 30 minutes. Increase time before increasing intensity.   Diabetes No   Hypertension Yes   Goal Participant will see blood pressure controlled within the values of 140/72m/Hg or within value directed by their physician.   Intervention Provide nutrition & aerobic exercise along with prescribed medications to achieve BP 140/90 or less.   Lipids Yes   Goal Cholesterol controlled with medications as prescribed, with individualized exercise RX and with personalized nutrition plan. Value goals: LDL  < 73m HDL > 4032mParticipant states understanding of desired cholesterol values and following prescriptions.   Intervention Provide nutrition & aerobic exercise along with prescribed medications to achieve LDL <52m25mDL >40mg65m   Personal Goals and Risk Factors Review:      Goals and Risk Factor Review      06/28/15 1802 07/01/15 1631 07/08/15 1641 07/13/15 0651     Weight Management   Goals Progress/Improvement seen No  Yes     Comments HAs not seen pounds change, maybe slight change in lclothing.   Is working on nutrition changes: More fruits, brown bread, no more sodas.  Joanmarie MathiWant47lbs instead of 149 and 150 lbs.     Increase Aerobic Exercise and Physical Activity   Goals Progress/Improvement seen  Yes Yes Yes Yes    Comments continues to progress with her exercise program Feels the XR6000 REL is helping her legs alot and she wants to cont to work on her leg strength" so they don't give out."  [employer] she is no longer short of breath like she was and is able to do more household chores. Her leg strength is doing great.  Faren is very compliant to exercise changes and suggestions. She has been increasing the resistance level on the NuStep on her own without prompting and has maintained appropriate watts at each level per staff recomendation. She is gaining aerobic endurance and can exercise for the full length of class.     Understand more about Heart/Pulmonary Disease   Goals Progress/Improvement seen  Yes  Yes     Comments States is learning alot, wishes her husband was able to attend,but his schedule conflicts with  the class time.  Cailyn Houdek has attended all the education sessions and asks questions during class at times.      Hypertension   Progress seen toward goals    Yes    Comments    Blood pressure levels have been excellant pre and post exercise.     Abnormal Lipids   Progress seen towards goals    Unknown    Comments    Has not had blood panel to  show progress       Personal Goals Discharge (Final Personal Goals and Risk Factors Review):      Goals and Risk Factor Review - 07/13/15 0651    Increase Aerobic Exercise and Physical Activity   Goals Progress/Improvement seen  Yes   Comments Delayna is very compliant to exercise changes and suggestions. She has been increasing the resistance level on the NuStep on her own without prompting and has maintained appropriate watts at each level per staff recomendation. She is gaining aerobic endurance and can exercise for the full length of class.    Hypertension   Progress seen toward goals Yes   Comments Blood pressure levels have been excellant pre and post exercise.    Abnormal Lipids   Progress seen towards goals Unknown   Comments Has not had blood panel to show progress       Comments: Ms. Mandile has been out recently but was doing really well exercising on the REL XR6000.

## 2015-08-11 ENCOUNTER — Telehealth: Payer: Self-pay | Admitting: *Deleted

## 2015-08-11 ENCOUNTER — Encounter: Payer: Self-pay | Admitting: *Deleted

## 2015-08-11 DIAGNOSIS — Z9889 Other specified postprocedural states: Secondary | ICD-10-CM

## 2015-08-11 NOTE — Progress Notes (Signed)
Cardiac Individual Treatment Plan  Patient Details  Name: Cindy Weaver MRN: [id number] Date of Birth: 1926-03-26 Referring Provider:  No ref. provider found  Initial Encounter Date:    Visit Diagnosis: S/P mitral valve repair  Patient's Home Medications on Admission:  Current outpatient prescriptions:  .  aspirin 81 MG chewable tablet, Chew 81 mg by mouth., Disp: , Rfl:  .  furosemide (LASIX) 40 MG tablet, Take 80 mg by mouth., Disp: , Rfl:  .  losartan (COZAAR) 25 MG tablet, Take 25 mg by mouth., Disp: , Rfl:  .  metoprolol tartrate (LOPRESSOR) 25 MG tablet, 85m (2 tabs) twice daily.  Please dispense 232mtabs instead of 5070mabs, Disp: , Rfl:  .  Multiple Vitamins-Minerals (MULTIVITAMIN WITH MINERALS) tablet, Take by mouth., Disp: , Rfl:  .  potassium chloride (K-DUR) 10 MEQ tablet, Take by mouth., Disp: , Rfl:  .  triamcinolone cream (KENALOG) 0.1 %, , Disp: , Rfl:  .  warfarin (COUMADIN) 2.5 MG tablet, 3.35m70m.5 tabs) Mon, Wed, Fri and 2.5mg 1mtab) other days or as directed  **Brand Coumadin, Disp: , Rfl:   Past Medical History: Past Medical History  Diagnosis Date  . CHF (congestive heart failure)   . Coronary artery disease   . Hypertension     Tobacco Use: History  Smoking status  . Former Smoker -- 1.00 packs/day for 25 years  . Quit date: 10/16/1993  Smokeless tobacco  . Not on file    Labs: Recent Review Flowsheet Data    There is no flowsheet data to display.       Exercise Target Goals:    Exercise Program Goal: Individual exercise prescription set with THRR, safety & activity barriers. Participant demonstrates ability to understand and report RPE using BORG scale, to self-measure pulse accurately, and to acknowledge the importance of the exercise prescription.  Exercise Prescription Goal: Starting with aerobic activity 30 plus minutes a day, 3 days per week for initial exercise prescription. Provide home exercise prescription and guidelines  that participant acknowledges understanding prior to discharge.  Activity Barriers & Risk Stratification:     Activity Barriers & Risk Stratification - 06/17/15 1506    Activity Barriers & Risk Stratification   Activity Barriers Arthritis   Risk Stratification High      6 Minute Walk:     6 Minute Walk      06/17/15 1500       6 Minute Walk   Phase Initial     Distance 845 feet     Walk Time 6 minutes     Resting HR 95 bpm     Resting BP 112/70 mmHg     Max Ex. HR 105 bpm     Max Ex. BP 128/68 mmHg     RPE 13     Symptoms No        Initial Exercise Prescription:     Initial Exercise Prescription - 06/17/15 1500    Date of Initial Exercise Prescription   Date 06/17/15   Treadmill   MPH 1.2   Grade 0   Minutes 10   Recumbant Bike   Level 1   RPM 40   Watts 20   Minutes 10   NuStep   Level 2   Watts 40   Minutes 10   Arm Ergometer   Level 1   Watts 10   Minutes 10   REL-XR   Level 2   Watts 40   Minutes 10  Prescription Details   Frequency (times per week) 3   Duration Progress to 30 minutes of continuous aerobic without signs/symptoms of physical distress   Intensity   THRR REST +  20   Ratings of Perceived Exertion 11-15   Perceived Dyspnea 2-4   Progression Continue progressive overload as per policy without signs/symptoms or physical distress.   Resistance Training   Training Prescription Yes   Weight 2   Reps 10-15      Exercise Prescription Changes:     Exercise Prescription Changes      06/28/15 0600 06/30/15 1600 07/20/15 1500 08/11/15 1800     Exercise Review   Progression Yes Yes Yes No    Response to Exercise   Blood Pressure (Admit) 126/62 mmHg 126/62 mmHg 118/62 mmHg --  Knee problems will not return.     Blood Pressure (Exercise) 136/72 mmHg 136/72 mmHg 138/64 mmHg     Blood Pressure (Exit) 140/70 mmHg 140/70 mmHg 112/76 mmHg     Heart Rate (Admit) 82 bpm 82 bpm 85 bpm     Heart Rate (Exercise) 95 bpm 95 bpm 92 bpm      Heart Rate (Exit) 74 bpm 74 bpm 78 bpm     Rating of Perceived Exertion (Exercise) 13 13 13      Symptoms   None     Comments   Reviewed individualized exercise prescription and made increases per departmental policy. Exercise increases were discussed with the patient and they were able to perform the new work loads without issue (no signs or symptoms). Lylian makes progression on her own as well and she has been encouraged to continue with this.      Duration Progress to 30 minutes of continuous aerobic without signs/symptoms of physical distress Progress to 30 minutes of continuous aerobic without signs/symptoms of physical distress Progress to 30 minutes of continuous aerobic without signs/symptoms of physical distress     Intensity Rest + 30 Rest + 30 Rest + 30     Progression Continue progressive overload as per policy without signs/symptoms or physical distress. Continue progressive overload as per policy without signs/symptoms or physical distress. Continue progressive overload as per policy without signs/symptoms or physical distress.     Resistance Training   Training Prescription Yes Yes Yes     Weight 2 2 2      Reps 10-15 10-15 10-15     Interval Training   Interval Training No No No     Treadmill   MPH  1.2 1.2     Grade  0 0     Minutes  3 3     NuStep   Level 3 3 3      Watts 30 30 30      Minutes 10 10 10      REL-XR   Level 2 2 4      Watts 30 30 45     Minutes 15 15 25         Discharge Exercise Prescription (Final Exercise Prescription Changes):     Exercise Prescription Changes - 08/11/15 1800    Exercise Review   Progression No   Response to Exercise   Blood Pressure (Admit) --  Knee problems will not return.       Nutrition:  Target Goals: Understanding of nutrition guidelines, daily intake of sodium <1542m, cholesterol <2028m calories 30% from fat and 7% or less from saturated fats, daily to have 5 or more servings of fruits and  vegetables.  Biometrics:  Pre Biometrics - 06/17/15 1503    Pre Biometrics   Height 5' 8"  (1.727 m)   Weight 149 lb 3.2 oz (67.677 kg)   Waist Circumference 35 inches   Hip Circumference 40 inches   Waist to Hip Ratio 0.88 %   BMI (Calculated) 22.7         Post Biometrics - 08/11/15 1846     Post  Biometrics   Waist Circumference --  Not done early exit      Nutrition Therapy Plan and Nutrition Goals:     Nutrition Therapy & Goals - 06/25/15 1050    Nutrition Therapy   Diet basic diet for poor appetite, prevent further weight loss   Drug/Food Interactions Coumadin/Vit K   Personal Nutrition Goals   Personal Goal #1 Try 2% or "light" cheese, or low fat cream cheese (for protein)   Personal Goal #2 Can also try Premier Protein drinks or Carnation breakfast drinks   Personal Goal #3 Use peanut butter as another way to take in protein   Personal Goal #4 Eat something - or have a protein drink - every 3-4 hours during the day      Nutrition Discharge: Rate Your Plate Scores:   Nutrition Goals Re-Evaluation:     Nutrition Goals Re-Evaluation      07/01/15 1630 07/08/15 1639 08/11/15 1845       Personal Goal #1 Re-Evaluation   Personal Goal #1 Zollie Beckers said it was very helpful for her to meet with the dietician and she is following her suggestions. Discussed church or buffet choices.        Goal Progress Seen   Yes     Personal Goal #2 Re-Evaluation   Personal Goal #2  Zollie Beckers said she has been drinking Boost protein drinks and doing well with them.       Goal Progress Seen  Yes Yes     Personal Goal #3 Re-Evaluation   Personal Goal #3  Madelynne Lasker states she has been eating peanut butter and jelly sandwhiches to get more protein.      Goal Progress Seen  Yes Yes     Personal Goal #4 Re-Evaluation   Personal Goal #4  Tristina Sahagian states she is able to have some type of protein every 4 hrs.       Goal Progress Seen  Yes Yes         Psychosocial: Target Goals: Acknowledge presence or absence of depression, maximize coping skills, provide positive support system. Participant is able to verbalize types and ability to use techniques and skills needed for reducing stress and depression.  Initial Review & Psychosocial Screening:   Quality of Life Scores:     Quality of Life - 06/17/15 1750    Quality of Life Scores   Health/Function Pre 19.11 %   Socioeconomic Pre 23.17 %   Psych/Spiritual Pre 23.79 %   Family Pre 23.38 %   GLOBAL Pre 21.5 %      PHQ-9:     Recent Review Flowsheet Data    Depression screen [facility] 2/9 06/17/2015   Decreased Interest 1   Down, Depressed, Hopeless 0   PHQ - 2 Score 1   Altered sleeping 1   Tired, decreased energy 3   Change in appetite 2   Feeling bad or failure about yourself  0   Trouble concentrating 0   Moving slowly or fidgety/restless 0   Suicidal thoughts 0   PHQ-9 Score 7  Difficult doing work/chores Not difficult at all      Psychosocial Evaluation and Intervention:     Psychosocial Evaluation - 06/23/15 1718    Psychosocial Evaluation & Interventions   Interventions Stress management education;Relaxation education;Encouraged to exercise with the program and follow exercise prescription   Comments Counselor met with Ms. Milbourne today.  She is an 81 year old who recently had heart surgery in July.  Ms. Jerilynn Mages is a widow as of February this year when her spouse of 59 years passed away.  She currently lives alone, but reports having a sister, nieces and a strong connection to her church community as her support system.  Ms. Jerilynn Mages reports she is sleeping better since her Dr. recommended Tylenol PM, but she has minimal appetite lately.  She denies a history of depression or anxiety symptoms or any current symptoms, other than occasional sadness or loneliness since her spouse passed.  Ms. Jerilynn Mages is typically in a pleasant mood and states she has minimal stress in her life at this  time.  Her goals for this program are to increase her energy and her stamina.   Continued Psychosocial Services Needed Yes  Ms. M will benefit from the psychoeducational components of this program, such as depression and relaxation.      Psychosocial Re-Evaluation:     Psychosocial Re-Evaluation      08/11/15 1846           Psychosocial Re-Evaluation   Comments I called Mrs. Zollie Beckers to check to see she is returning. She said no since she has seen an Orthopedic MD for her knee problems.           Vocational Rehabilitation: Provide vocational rehab assistance to qualifying candidates.   Vocational Rehab Evaluation & Intervention:     Vocational Rehab - 06/17/15 1507    Initial Vocational Rehab Evaluation & Intervention   Assessment shows need for Vocational Rehabilitation No      Education: Education Goals: Education classes will be provided on a weekly basis, covering required topics. Participant will state understanding/return demonstration of topics presented.  Learning Barriers/Preferences:     Learning Barriers/Preferences - 06/17/15 1506    Learning Barriers/Preferences   Learning Barriers None   Learning Preferences None      Education Topics: General Nutrition Guidelines/Fats and Fiber: -Group instruction provided by verbal, written material, models and posters to present the general guidelines for heart healthy nutrition. Gives an explanation and review of dietary fats and fiber.   Controlling Sodium/Reading Food Labels: -Group verbal and written material supporting the discussion of sodium use in heart healthy nutrition. Review and explanation with models, verbal and written materials for utilization of the food label.   Exercise Physiology & Risk Factors: - Group verbal and written instruction with models to review the exercise physiology of the cardiovascular system and associated critical values. Details cardiovascular disease risk factors and  the goals associated with each risk factor.          Cardiac Rehab from 07/07/2015 in [facility] Cardiac Rehab   Date  06/23/15   Educator  RM   Instruction Review Code  2- meets goals/outcomes      Aerobic Exercise & Resistance Training: - Gives group verbal and written discussion on the health impact of inactivity. On the components of aerobic and resistive training programs and the benefits of this training and how to safely progress through these programs.      Cardiac Rehab from 07/07/2015 in [facility] Cardiac Rehab  Date  06/28/15   Educator  SW   Instruction Review Code  2- meets goals/outcomes      Flexibility, Balance, General Exercise Guidelines: - Provides group verbal and written instruction on the benefits of flexibility and balance training programs. Provides general exercise guidelines with specific guidelines to those with heart or lung disease. Demonstration and skill practice provided.      Cardiac Rehab from 07/07/2015 in [facility] Cardiac Rehab   Date  07/05/15   Educator  SW   Instruction Review Code  2- meets goals/outcomes      Stress Management: - Provides group verbal and written instruction about the health risks of elevated stress, cause of high stress, and healthy ways to reduce stress.      Cardiac Rehab from 07/07/2015 in [facility] Cardiac Rehab   Date  07/07/15   Educator  Kathreen Cornfield   Instruction Review Code  2- meets goals/outcomes      Depression: - Provides group verbal and written instruction on the correlation between heart/lung disease and depressed mood, treatment options, and the stigmas associated with seeking treatment.   Anatomy & Physiology of the Heart: - Group verbal and written instruction and models provide basic cardiac anatomy and physiology, with the coronary electrical and arterial systems. Review of: AMI, Angina, Valve disease, Heart Failure, Cardiac Arrhythmia, Pacemakers, and the ICD.   Cardiac Procedures: - Group verbal and written  instruction and models to describe the testing methods done to diagnose heart disease. Reviews the outcomes of the test results. Describes the treatment choices: Medical Management, Angioplasty, or Coronary Bypass Surgery.   Cardiac Medications: - Group verbal and written instruction to review commonly prescribed medications for heart disease. Reviews the medication, class of the drug, and side effects. Includes the steps to properly store meds and maintain the prescription regimen.   Go Sex-Intimacy & Heart Disease, Get SMART - Goal Setting: - Group verbal and written instruction through game format to discuss heart disease and the return to sexual intimacy. Provides group verbal and written material to discuss and apply goal setting through the application of the S.M.A.R.T. Method.   Other Matters of the Heart: - Provides group verbal, written materials and models to describe Heart Failure, Angina, Valve Disease, and Diabetes in the realm of heart disease. Includes description of the disease process and treatment options available to the cardiac patient.   Exercise & Equipment Safety: - Individual verbal instruction and demonstration of equipment use and safety with use of the equipment.      Cardiac Rehab from 07/07/2015 in [facility] Cardiac Rehab   Date  06/17/15   Educator  C. Rylan Kaufmann,RN   Instruction Review Code  2- meets goals/outcomes      Infection Prevention: - Provides verbal and written material to individual with discussion of infection control including proper hand washing and proper equipment cleaning during exercise session.      Cardiac Rehab from 07/07/2015 in [facility] Cardiac Rehab   Date  06/17/15   Educator  C. Desera Graffeo,RN   Instruction Review Code  2- meets goals/outcomes      Falls Prevention: - Provides verbal and written material to individual with discussion of falls prevention and safety.      Cardiac Rehab from 07/07/2015 in [facility] Cardiac Rehab   Date  06/17/15    Educator  C. EnterkinRN   Instruction Review Code  2- meets goals/outcomes      Diabetes: - Individual verbal and written instruction to review signs/symptoms of diabetes, desired  ranges of glucose level fasting, after meals and with exercise. Advice that pre and post exercise glucose checks will be done for 3 sessions at entry of program.    Knowledge Questionnaire Score:     Knowledge Questionnaire Score - 06/17/15 1506    Knowledge Questionnaire Score   Pre Score 20      Personal Goals and Risk Factors at Admission:     Personal Goals and Risk Factors at Admission - 06/17/15 1511    Personal Goals and Risk Factors on Admission   Increase Aerobic Exercise and Physical Activity Yes   Intervention While in program, learn and follow the exercise prescription taught. Start at a low level workload and increase workload after able to maintain previous level for 30 minutes. Increase time before increasing intensity.   Diabetes No   Hypertension Yes   Goal Participant will see blood pressure controlled within the values of 140/45m/Hg or within value directed by their physician.   Intervention Provide nutrition & aerobic exercise along with prescribed medications to achieve BP 140/90 or less.   Lipids Yes   Goal Cholesterol controlled with medications as prescribed, with individualized exercise RX and with personalized nutrition plan. Value goals: LDL < 737m HDL > 403mParticipant states understanding of desired cholesterol values and following prescriptions.   Intervention Provide nutrition & aerobic exercise along with prescribed medications to achieve LDL <46m87mDL >40mg87m   Personal Goals and Risk Factors Review:      Goals and Risk Factor Review      06/28/15 1802 07/01/15 1631 07/08/15 1641 07/13/15 0651 08/11/15 1846   Weight Management   Goals Progress/Improvement seen No  Yes  No   Comments HAs not seen pounds change, maybe slight change in lclothing.   Is working  on nutrition changes: More fruits, brown bread, no more sodas.  Laverta MathiMurrill47lbs instead of 149 and 150 lbs.     Increase Aerobic Exercise and Physical Activity   Goals Progress/Improvement seen  Yes Yes Yes Yes No   Comments continues to progress with her exercise program Feels the [id number] REL is helping her legs alot and she wants to cont to work on her leg strength" so they don't give out."  LilleWal-Martes she is no longer short of breath like she was and is able to do more household chores. Her leg strength is doing great.  Alyssamarie is very compliant to exercise changes and suggestions. She has been increasing the resistance level on the NuStep on her own without prompting and has maintained appropriate watts at each level per staff recomendation. She is gaining aerobic endurance and can exercise for the full length of class.  I called Mrs. LilleZollie Beckersheck to see she is returning. She said no since she has seen an Orthopedic MD for her knee problems.    Understand more about Heart/Pulmonary Disease   Goals Progress/Improvement seen  Yes  Yes  Yes   Comments States is learning alot, wishes her husband was able to attend,but his schedule conflicts with the class time.  LilleJoycelin Radloffattended all the education sessions and asks questions during class at times.      Hypertension   Progress seen toward goals    Yes Yes   Comments    Blood pressure levels have been excellant pre and post exercise.     Abnormal Lipids   Progress seen towards goals    Unknown Unknown   Comments  Has not had blood panel to show progress       Personal Goals Discharge (Final Personal Goals and Risk Factors Review):      Goals and Risk Factor Review - 08/11/15 1846    Weight Management   Goals Progress/Improvement seen No   Increase Aerobic Exercise and Physical Activity   Goals Progress/Improvement seen  No   Comments I called Mrs. Zollie Beckers to check to see she is returning. She said no  since she has seen an Orthopedic MD for her knee problems.    Understand more about Heart/Pulmonary Disease   Goals Progress/Improvement seen  Yes   Hypertension   Progress seen toward goals Yes   Abnormal Lipids   Progress seen towards goals Unknown       Comments: I called Mrs. Zollie Beckers to check to see she is returning. She said no since she has seen an Orthopedic MD for her knee problems.

## 2015-08-11 NOTE — Progress Notes (Signed)
Discharge Summary  Patient Details  Name: Cindy Weaver MRN: [id number] Date of Birth: 04/20/1926 Referring Provider:  No ref. provider found   Number of Visits: early exit  Reason for Discharge:  Early Exit:  Personal  Smoking History:  History  Smoking status  . Former Smoker -- 1.00 packs/day for 25 years  . Quit date: 10/16/1993  Smokeless tobacco  . Not on file    Diagnosis:  S/P mitral valve repair  ADL UCSD:   Initial Exercise Prescription:     Initial Exercise Prescription - 06/17/15 1500    Date of Initial Exercise Prescription   Date 06/17/15   Treadmill   MPH 1.2   Grade 0   Minutes 10   Recumbant Bike   Level 1   RPM 40   Watts 20   Minutes 10   NuStep   Level 2   Watts 40   Minutes 10   Arm Ergometer   Level 1   Watts 10   Minutes 10   REL-XR   Level 2   Watts 40   Minutes 10   Prescription Details   Frequency (times per week) 3   Duration Progress to 30 minutes of continuous aerobic without signs/symptoms of physical distress   Intensity   THRR REST +  20   Ratings of Perceived Exertion 11-15   Perceived Dyspnea 2-4   Progression Continue progressive overload as per policy without signs/symptoms or physical distress.   Resistance Training   Training Prescription Yes   Weight 2   Reps 10-15      Discharge Exercise Prescription (Final Exercise Prescription Changes):     Exercise Prescription Changes - 08/11/15 1800    Exercise Review   Progression No   Response to Exercise   Blood Pressure (Admit) --  Knee problems will not return.       Functional Capacity:     6 Minute Walk      06/17/15 1500       6 Minute Walk   Phase Initial     Distance 845 feet     Walk Time 6 minutes     Resting HR 95 bpm     Resting BP 112/70 mmHg     Max Ex. HR 105 bpm     Max Ex. BP 128/68 mmHg     RPE 13     Symptoms No        Psychological, QOL, Others - Outcomes: PHQ 2/9: Depression screen PHQ 2/9 06/17/2015  Decreased  Interest 1  Down, Depressed, Hopeless 0  PHQ - 2 Score 1  Altered sleeping 1  Tired, decreased energy 3  Change in appetite 2  Feeling bad or failure about yourself  0  Trouble concentrating 0  Moving slowly or fidgety/restless 0  Suicidal thoughts 0  PHQ-9 Score 7  Difficult doing work/chores Not difficult at all    Quality of Life:     Quality of Life - 06/17/15 1750    Quality of Life Scores   Health/Function Pre 19.11 %   Socioeconomic Pre 23.17 %   Psych/Spiritual Pre 23.79 %   Family Pre 23.38 %   GLOBAL Pre 21.5 %      Personal Goals: Goals established at orientation with interventions provided to work toward goal.     Personal Goals and Risk Factors at Admission - 06/17/15 1511    Personal Goals and Risk Factors on Admission   Increase Aerobic Exercise and Physical Activity  Yes   Intervention While in program, learn and follow the exercise prescription taught. Start at a low level workload and increase workload after able to maintain previous level for 30 minutes. Increase time before increasing intensity.   Diabetes No   Hypertension Yes   Goal Participant will see blood pressure controlled within the values of 140/22mm/Hg or within value directed by their physician.   Intervention Provide nutrition & aerobic exercise along with prescribed medications to achieve BP 140/90 or less.   Lipids Yes   Goal Cholesterol controlled with medications as prescribed, with individualized exercise RX and with personalized nutrition plan. Value goals: LDL < , HDL > . Participant states understanding of desired cholesterol values and following prescriptions.   Intervention Provide nutrition & aerobic exercise along with prescribed medications to achieve LDL 70mg , HDL >40mg .       Personal Goals Discharge:     Goals and Risk Factor Review      06/28/15 1802 07/01/15 1631 07/08/15 1641 07/13/15 0651 08/11/15 1846   Weight Management   Goals Progress/Improvement seen  No  Yes  No   Comments HAs not seen pounds change, maybe slight change in lclothing.   Is working on nutrition changes: More fruits, brown bread, no more sodas.  Cindy Weaver is 147lbs instead of 149 and 150 lbs.     Increase Aerobic Exercise and Physical Activity   Goals Progress/Improvement seen  Yes Yes Yes Yes No   Comments continues to progress with her exercise program Feels the [id number] REL is helping her legs alot and she wants to cont to work on her leg strength" so they don't give out."  [employer] states she is no longer short of breath like she was and is able to do more household chores. Her leg strength is doing great.  Cindy Weaver is very compliant to exercise changes and suggestions. She has been increasing the resistance level on the NuStep on her own without prompting and has maintained appropriate watts at each level per staff recomendation. She is gaining aerobic endurance and can exercise for the full length of class.  I called Cindy Weaver to check to see she is returning. She said no since she has seen an Orthopedic MD for her knee problems.    Understand more about Heart/Pulmonary Disease   Goals Progress/Improvement seen  Yes  Yes  Yes   Comments States is learning alot, wishes her husband was able to attend,but his schedule conflicts with the class time.  Cindy Weaver has attended all the education sessions and asks questions during class at times.      Hypertension   Progress seen toward goals    Yes Yes   Comments    Blood pressure levels have been excellant pre and post exercise.     Abnormal Lipids   Progress seen towards goals    Unknown Unknown   Comments    Has not had blood panel to show progress       Nutrition & Weight - Outcomes:     Pre Biometrics - 06/17/15 1503    Pre Biometrics   Height  (1.727 m)   Weight 149 lb 3.2 oz (67.677 kg)   Waist Circumference 35 inches   Hip Circumference 40 inches   Waist to Hip Ratio 0.88 %   BMI (Calculated)  22.7         Post Biometrics - 08/11/15 1846     Post  Biometrics   Waist Circumference --  Not done early exit      Nutrition:     Nutrition Therapy & Goals - 06/25/15 1050    Nutrition Therapy   Diet basic diet for poor appetite, prevent further weight loss   Drug/Food Interactions Coumadin/Vit K   Personal Nutrition Goals   Personal Goal #1 Try 2% or "light" cheese, or low fat cream cheese (for protein)   Personal Goal #2 Can also try Premier Protein drinks or Carnation breakfast drinks   Personal Goal #3 Use peanut butter as another way to take in protein   Personal Goal #4 Eat something - or have a protein drink - every 3-4 hours during the day      Nutrition Discharge:   Education Questionnaire Score:     Knowledge Questionnaire Score - 06/17/15 1506    Knowledge Questionnaire Score   Pre Score 20      Goals reviewed with patient; copy given to patient.

## 2015-08-11 NOTE — Telephone Encounter (Signed)
I called Mrs. Cindy Weaver to check to see she is returning. She said no since she has seen an Orthopedic MD for her knee problems.

## 2015-08-11 NOTE — Progress Notes (Signed)
I called Cindy Weaver to check to see she is returning. She said no since she has seen an Orthopedic MD for her knee problems.  

## 2015-08-16 ENCOUNTER — Encounter: Payer: Self-pay | Admitting: *Deleted

## 2017-05-02 ENCOUNTER — Emergency Department: Payer: Medicare Other

## 2017-05-02 ENCOUNTER — Inpatient Hospital Stay
Admission: EM | Admit: 2017-05-02 | Discharge: 2017-06-16 | DRG: 469 | Disposition: E | Payer: Medicare Other | Attending: Internal Medicine | Admitting: Internal Medicine

## 2017-05-02 ENCOUNTER — Encounter: Payer: Self-pay | Admitting: Emergency Medicine

## 2017-05-02 ENCOUNTER — Inpatient Hospital Stay (HOSPITAL_COMMUNITY)
Admit: 2017-05-02 | Discharge: 2017-05-02 | Disposition: A | Discharge status: Home / Self Care | Payer: Medicare Other | Attending: Physician Assistant | Admitting: Physician Assistant

## 2017-05-02 DIAGNOSIS — J90 Pleural effusion, not elsewhere classified: Secondary | ICD-10-CM | POA: Diagnosis not present

## 2017-05-02 DIAGNOSIS — I5023 Acute on chronic systolic (congestive) heart failure: Secondary | ICD-10-CM | POA: Diagnosis not present

## 2017-05-02 DIAGNOSIS — J209 Acute bronchitis, unspecified: Secondary | ICD-10-CM | POA: Diagnosis present

## 2017-05-02 DIAGNOSIS — I4891 Unspecified atrial fibrillation: Secondary | ICD-10-CM | POA: Diagnosis present

## 2017-05-02 DIAGNOSIS — Z951 Presence of aortocoronary bypass graft: Secondary | ICD-10-CM

## 2017-05-02 DIAGNOSIS — I360 Nonrheumatic tricuspid (valve) stenosis: Secondary | ICD-10-CM | POA: Diagnosis not present

## 2017-05-02 DIAGNOSIS — E871 Hypo-osmolality and hyponatremia: Secondary | ICD-10-CM | POA: Diagnosis present

## 2017-05-02 DIAGNOSIS — I482 Chronic atrial fibrillation: Secondary | ICD-10-CM | POA: Diagnosis present

## 2017-05-02 DIAGNOSIS — D62 Acute posthemorrhagic anemia: Secondary | ICD-10-CM | POA: Diagnosis not present

## 2017-05-02 DIAGNOSIS — J189 Pneumonia, unspecified organism: Secondary | ICD-10-CM | POA: Diagnosis present

## 2017-05-02 DIAGNOSIS — J441 Chronic obstructive pulmonary disease with (acute) exacerbation: Secondary | ICD-10-CM | POA: Diagnosis present

## 2017-05-02 DIAGNOSIS — E875 Hyperkalemia: Secondary | ICD-10-CM | POA: Diagnosis present

## 2017-05-02 DIAGNOSIS — I248 Other forms of acute ischemic heart disease: Secondary | ICD-10-CM | POA: Diagnosis present

## 2017-05-02 DIAGNOSIS — Z953 Presence of xenogenic heart valve: Secondary | ICD-10-CM

## 2017-05-02 DIAGNOSIS — Z7951 Long term (current) use of inhaled steroids: Secondary | ICD-10-CM | POA: Diagnosis not present

## 2017-05-02 DIAGNOSIS — N189 Chronic kidney disease, unspecified: Secondary | ICD-10-CM | POA: Diagnosis not present

## 2017-05-02 DIAGNOSIS — D689 Coagulation defect, unspecified: Secondary | ICD-10-CM | POA: Diagnosis present

## 2017-05-02 DIAGNOSIS — I371 Nonrheumatic pulmonary valve insufficiency: Secondary | ICD-10-CM | POA: Diagnosis present

## 2017-05-02 DIAGNOSIS — D649 Anemia, unspecified: Secondary | ICD-10-CM | POA: Diagnosis present

## 2017-05-02 DIAGNOSIS — S72009A Fracture of unspecified part of neck of unspecified femur, initial encounter for closed fracture: Secondary | ICD-10-CM | POA: Diagnosis present

## 2017-05-02 DIAGNOSIS — Z880 Allergy status to penicillin: Secondary | ICD-10-CM

## 2017-05-02 DIAGNOSIS — R112 Nausea with vomiting, unspecified: Secondary | ICD-10-CM | POA: Diagnosis not present

## 2017-05-02 DIAGNOSIS — Z01810 Encounter for preprocedural cardiovascular examination: Secondary | ICD-10-CM

## 2017-05-02 DIAGNOSIS — Y92009 Unspecified place in unspecified non-institutional (private) residence as the place of occurrence of the external cause: Secondary | ICD-10-CM | POA: Diagnosis not present

## 2017-05-02 DIAGNOSIS — N183 Chronic kidney disease, stage 3 (moderate): Secondary | ICD-10-CM | POA: Diagnosis present

## 2017-05-02 DIAGNOSIS — R7989 Other specified abnormal findings of blood chemistry: Secondary | ICD-10-CM

## 2017-05-02 DIAGNOSIS — N179 Acute kidney failure, unspecified: Secondary | ICD-10-CM | POA: Diagnosis not present

## 2017-05-02 DIAGNOSIS — W1830XA Fall on same level, unspecified, initial encounter: Secondary | ICD-10-CM | POA: Diagnosis present

## 2017-05-02 DIAGNOSIS — I25110 Atherosclerotic heart disease of native coronary artery with unstable angina pectoris: Secondary | ICD-10-CM | POA: Diagnosis present

## 2017-05-02 DIAGNOSIS — S72001A Fracture of unspecified part of neck of right femur, initial encounter for closed fracture: Secondary | ICD-10-CM | POA: Diagnosis not present

## 2017-05-02 DIAGNOSIS — Z96649 Presence of unspecified artificial hip joint: Secondary | ICD-10-CM

## 2017-05-02 DIAGNOSIS — N170 Acute kidney failure with tubular necrosis: Secondary | ICD-10-CM | POA: Diagnosis present

## 2017-05-02 DIAGNOSIS — Z79899 Other long term (current) drug therapy: Secondary | ICD-10-CM

## 2017-05-02 DIAGNOSIS — I130 Hypertensive heart and chronic kidney disease with heart failure and stage 1 through stage 4 chronic kidney disease, or unspecified chronic kidney disease: Secondary | ICD-10-CM | POA: Diagnosis present

## 2017-05-02 DIAGNOSIS — L8990 Pressure ulcer of unspecified site, unspecified stage: Secondary | ICD-10-CM | POA: Insufficient documentation

## 2017-05-02 DIAGNOSIS — R05 Cough: Secondary | ICD-10-CM | POA: Diagnosis not present

## 2017-05-02 DIAGNOSIS — I272 Pulmonary hypertension, unspecified: Secondary | ICD-10-CM | POA: Diagnosis present

## 2017-05-02 DIAGNOSIS — K219 Gastro-esophageal reflux disease without esophagitis: Secondary | ICD-10-CM | POA: Diagnosis present

## 2017-05-02 DIAGNOSIS — R0600 Dyspnea, unspecified: Secondary | ICD-10-CM

## 2017-05-02 DIAGNOSIS — Z888 Allergy status to other drugs, medicaments and biological substances status: Secondary | ICD-10-CM

## 2017-05-02 DIAGNOSIS — R059 Cough, unspecified: Secondary | ICD-10-CM

## 2017-05-02 DIAGNOSIS — J440 Chronic obstructive pulmonary disease with acute lower respiratory infection: Secondary | ICD-10-CM | POA: Diagnosis present

## 2017-05-02 DIAGNOSIS — I959 Hypotension, unspecified: Secondary | ICD-10-CM | POA: Diagnosis not present

## 2017-05-02 DIAGNOSIS — Z7901 Long term (current) use of anticoagulants: Secondary | ICD-10-CM | POA: Diagnosis not present

## 2017-05-02 DIAGNOSIS — Z7189 Other specified counseling: Secondary | ICD-10-CM

## 2017-05-02 DIAGNOSIS — Z9889 Other specified postprocedural states: Secondary | ICD-10-CM

## 2017-05-02 DIAGNOSIS — S72145A Nondisplaced intertrochanteric fracture of left femur, initial encounter for closed fracture: Principal | ICD-10-CM | POA: Diagnosis present

## 2017-05-02 DIAGNOSIS — J9621 Acute and chronic respiratory failure with hypoxia: Secondary | ICD-10-CM | POA: Diagnosis not present

## 2017-05-02 DIAGNOSIS — E440 Moderate protein-calorie malnutrition: Secondary | ICD-10-CM | POA: Diagnosis present

## 2017-05-02 DIAGNOSIS — Z8249 Family history of ischemic heart disease and other diseases of the circulatory system: Secondary | ICD-10-CM

## 2017-05-02 DIAGNOSIS — Z87891 Personal history of nicotine dependence: Secondary | ICD-10-CM | POA: Diagnosis not present

## 2017-05-02 DIAGNOSIS — J9600 Acute respiratory failure, unspecified whether with hypoxia or hypercapnia: Secondary | ICD-10-CM

## 2017-05-02 DIAGNOSIS — J9690 Respiratory failure, unspecified, unspecified whether with hypoxia or hypercapnia: Secondary | ICD-10-CM

## 2017-05-02 DIAGNOSIS — Z515 Encounter for palliative care: Secondary | ICD-10-CM | POA: Diagnosis not present

## 2017-05-02 DIAGNOSIS — Z992 Dependence on renal dialysis: Secondary | ICD-10-CM

## 2017-05-02 DIAGNOSIS — G9340 Encephalopathy, unspecified: Secondary | ICD-10-CM | POA: Diagnosis present

## 2017-05-02 DIAGNOSIS — Z9229 Personal history of other drug therapy: Secondary | ICD-10-CM

## 2017-05-02 DIAGNOSIS — I429 Cardiomyopathy, unspecified: Secondary | ICD-10-CM | POA: Diagnosis present

## 2017-05-02 DIAGNOSIS — I952 Hypotension due to drugs: Secondary | ICD-10-CM | POA: Diagnosis not present

## 2017-05-02 DIAGNOSIS — S72001D Fracture of unspecified part of neck of right femur, subsequent encounter for closed fracture with routine healing: Secondary | ICD-10-CM | POA: Diagnosis not present

## 2017-05-02 DIAGNOSIS — D631 Anemia in chronic kidney disease: Secondary | ICD-10-CM | POA: Diagnosis present

## 2017-05-02 DIAGNOSIS — Z6824 Body mass index (BMI) 24.0-24.9, adult: Secondary | ICD-10-CM

## 2017-05-02 DIAGNOSIS — R062 Wheezing: Secondary | ICD-10-CM

## 2017-05-02 DIAGNOSIS — E876 Hypokalemia: Secondary | ICD-10-CM | POA: Diagnosis present

## 2017-05-02 DIAGNOSIS — R579 Shock, unspecified: Secondary | ICD-10-CM | POA: Diagnosis present

## 2017-05-02 DIAGNOSIS — R0902 Hypoxemia: Secondary | ICD-10-CM

## 2017-05-02 DIAGNOSIS — Z66 Do not resuscitate: Secondary | ICD-10-CM | POA: Diagnosis present

## 2017-05-02 DIAGNOSIS — R109 Unspecified abdominal pain: Secondary | ICD-10-CM

## 2017-05-02 DIAGNOSIS — Z952 Presence of prosthetic heart valve: Secondary | ICD-10-CM | POA: Diagnosis not present

## 2017-05-02 DIAGNOSIS — I951 Orthostatic hypotension: Secondary | ICD-10-CM | POA: Diagnosis present

## 2017-05-02 DIAGNOSIS — R778 Other specified abnormalities of plasma proteins: Secondary | ICD-10-CM | POA: Diagnosis present

## 2017-05-02 DIAGNOSIS — R748 Abnormal levels of other serum enzymes: Secondary | ICD-10-CM

## 2017-05-02 DIAGNOSIS — S72002D Fracture of unspecified part of neck of left femur, subsequent encounter for closed fracture with routine healing: Secondary | ICD-10-CM | POA: Diagnosis not present

## 2017-05-02 DIAGNOSIS — S72145D Nondisplaced intertrochanteric fracture of left femur, subsequent encounter for closed fracture with routine healing: Secondary | ICD-10-CM | POA: Diagnosis not present

## 2017-05-02 DIAGNOSIS — Z9981 Dependence on supplemental oxygen: Secondary | ICD-10-CM

## 2017-05-02 DIAGNOSIS — R34 Anuria and oliguria: Secondary | ICD-10-CM | POA: Diagnosis present

## 2017-05-02 DIAGNOSIS — D500 Iron deficiency anemia secondary to blood loss (chronic): Secondary | ICD-10-CM | POA: Diagnosis present

## 2017-05-02 DIAGNOSIS — K5900 Constipation, unspecified: Secondary | ICD-10-CM | POA: Diagnosis present

## 2017-05-02 DIAGNOSIS — G4700 Insomnia, unspecified: Secondary | ICD-10-CM | POA: Diagnosis present

## 2017-05-02 HISTORY — DX: Other specified postprocedural states: Z95.818

## 2017-05-02 HISTORY — DX: Presence of prosthetic heart valve: Z95.2

## 2017-05-02 HISTORY — DX: Unspecified atrial fibrillation: I48.91

## 2017-05-02 HISTORY — DX: Other specified postprocedural states: Z98.890

## 2017-05-02 HISTORY — DX: Chronic systolic (congestive) heart failure: I50.22

## 2017-05-02 LAB — BASIC METABOLIC PANEL
Anion gap: 10 (ref 5–15)
BUN: 25 mg/dL — AB (ref 6–20)
CALCIUM: 8.9 mg/dL (ref 8.9–10.3)
CO2: 28 mmol/L (ref 22–32)
Chloride: 99 mmol/L — ABNORMAL LOW (ref 101–111)
Creatinine, Ser: 1.4 mg/dL — ABNORMAL HIGH (ref 0.44–1.00)
GFR calc Af Amer: 38 mL/min — ABNORMAL LOW (ref >60)
GFR, EST NON AFRICAN AMERICAN: 32 mL/min — AB (ref >60)
GLUCOSE: 163 mg/dL — AB (ref 65–99)
Potassium: 4 mmol/L (ref 3.5–5.1)
Sodium: 137 mmol/L (ref 135–145)

## 2017-05-02 LAB — CBC
HCT: 28.8 % — ABNORMAL LOW (ref 35.0–47.0)
HEMOGLOBIN: 9.7 g/dL — AB (ref 12.0–16.0)
MCH: 32.9 pg (ref 26.0–34.0)
MCHC: 33.7 g/dL (ref 32.0–36.0)
MCV: 97.6 fL (ref 80.0–100.0)
Platelets: 316 10*3/uL (ref 150–440)
RBC: 2.96 MIL/uL — ABNORMAL LOW (ref 3.80–5.20)
RDW: 13.2 % (ref 11.5–14.5)
WBC: 15.2 10*3/uL — ABNORMAL HIGH (ref 3.6–11.0)

## 2017-05-02 LAB — BRAIN NATRIURETIC PEPTIDE: B NATRIURETIC PEPTIDE 5: 485 pg/mL — AB (ref 0.0–100.0)

## 2017-05-02 LAB — PROTIME-INR
INR: 3.59
Prothrombin Time: 36.7 seconds — ABNORMAL HIGH (ref 11.4–15.2)

## 2017-05-02 LAB — APTT: aPTT: 78 seconds — ABNORMAL HIGH (ref 24–36)

## 2017-05-02 LAB — TROPONIN I
TROPONIN I: 0.03 ng/mL — AB (ref <0.03)
TROPONIN I: 0.13 ng/mL — AB (ref <0.03)
Troponin I: 0.16 ng/mL (ref <0.03)
Troponin I: 0.16 ng/mL (ref <0.03)

## 2017-05-02 LAB — TSH: TSH: 4.718 u[IU]/mL — ABNORMAL HIGH (ref 0.350–4.500)

## 2017-05-02 LAB — MAGNESIUM: MAGNESIUM: 1.7 mg/dL (ref 1.7–2.4)

## 2017-05-02 MED ORDER — FUROSEMIDE 10 MG/ML IJ SOLN
40.0000 mg | Freq: Once | INTRAMUSCULAR | Status: AC
  Administered 2017-05-02: 40 mg via INTRAVENOUS
  Filled 2017-05-02: qty 4

## 2017-05-02 MED ORDER — FUROSEMIDE 40 MG PO TABS
80.0000 mg | ORAL_TABLET | Freq: Every day | ORAL | Status: DC
  Administered 2017-05-02: 80 mg via ORAL
  Filled 2017-05-02: qty 2

## 2017-05-02 MED ORDER — DILTIAZEM HCL 25 MG/5ML IV SOLN
10.0000 mg | Freq: Once | INTRAVENOUS | Status: AC
  Administered 2017-05-02: 10 mg via INTRAVENOUS
  Filled 2017-05-02: qty 5

## 2017-05-02 MED ORDER — MORPHINE SULFATE (PF) 2 MG/ML IV SOLN
2.0000 mg | Freq: Once | INTRAVENOUS | Status: AC
  Administered 2017-05-02: 2 mg via INTRAVENOUS
  Filled 2017-05-02: qty 1

## 2017-05-02 MED ORDER — METOPROLOL TARTRATE 25 MG PO TABS
25.0000 mg | ORAL_TABLET | Freq: Two times a day (BID) | ORAL | Status: DC
  Administered 2017-05-03: 25 mg via ORAL
  Filled 2017-05-02 (×3): qty 1

## 2017-05-02 MED ORDER — AMIODARONE HCL IN DEXTROSE 360-4.14 MG/200ML-% IV SOLN
60.0000 mg/h | INTRAVENOUS | Status: AC
  Administered 2017-05-02 (×2): 60 mg/h via INTRAVENOUS
  Filled 2017-05-02 (×2): qty 200

## 2017-05-02 MED ORDER — DIGOXIN 0.25 MG/ML IJ SOLN
0.2500 mg | Freq: Once | INTRAMUSCULAR | Status: AC
  Administered 2017-05-02: 0.25 mg via INTRAVENOUS
  Filled 2017-05-02 (×2): qty 2
  Filled 2017-05-02: qty 1

## 2017-05-02 MED ORDER — AMIODARONE HCL IN DEXTROSE 360-4.14 MG/200ML-% IV SOLN
60.0000 mg/h | INTRAVENOUS | Status: AC
  Administered 2017-05-02 – 2017-05-03 (×4): 60 mg/h via INTRAVENOUS
  Filled 2017-05-02 (×4): qty 200

## 2017-05-02 MED ORDER — AMIODARONE HCL IN DEXTROSE 360-4.14 MG/200ML-% IV SOLN: 30.0000 mg/h | INTRAVENOUS | Status: DC

## 2017-05-02 MED ORDER — POTASSIUM CHLORIDE ER 10 MEQ PO TBCR
10.0000 meq | EXTENDED_RELEASE_TABLET | Freq: Every day | ORAL | Status: DC
  Administered 2017-05-02 – 2017-05-04 (×3): 10 meq via ORAL
  Filled 2017-05-02 (×6): qty 1

## 2017-05-02 MED ORDER — MOMETASONE FURO-FORMOTEROL FUM 200-5 MCG/ACT IN AERO
2.0000 | INHALATION_SPRAY | Freq: Two times a day (BID) | RESPIRATORY_TRACT | Status: DC
  Administered 2017-05-02 – 2017-05-11 (×19): 2 via RESPIRATORY_TRACT
  Filled 2017-05-02: qty 8.8

## 2017-05-02 MED ORDER — PANTOPRAZOLE SODIUM 40 MG PO TBEC
40.0000 mg | DELAYED_RELEASE_TABLET | Freq: Every day | ORAL | Status: DC
  Administered 2017-05-02 – 2017-05-17 (×14): 40 mg via ORAL
  Filled 2017-05-02 (×14): qty 1

## 2017-05-02 MED ORDER — ACETAMINOPHEN 650 MG RE SUPP: 650.0000 mg | Freq: Four times a day (QID) | RECTAL | Status: DC | PRN

## 2017-05-02 MED ORDER — ADULT MULTIVITAMIN W/MINERALS CH
1.0000 | ORAL_TABLET | Freq: Every day | ORAL | Status: DC
  Administered 2017-05-02 – 2017-05-17 (×15): 1 via ORAL
  Filled 2017-05-02 (×16): qty 1

## 2017-05-02 MED ORDER — VITAMIN K (PHYTONADIONE) 100 MCG PO TABS: 200.0000 ug | ORAL_TABLET | Freq: Every day | ORAL | Status: DC

## 2017-05-02 MED ORDER — PHYTONADIONE 1 MG/0.5 ML ORAL SOLUTION
0.2000 mg | Freq: Every day | ORAL | Status: DC
  Administered 2017-05-02 – 2017-05-03 (×2): 0.2 mg via ORAL
  Filled 2017-05-02 (×4): qty 0.5

## 2017-05-02 MED ORDER — FLUTICASONE PROPIONATE 50 MCG/ACT NA SUSP
1.0000 | Freq: Every day | NASAL | Status: DC
  Administered 2017-05-04 – 2017-05-14 (×9): 1 via NASAL
  Filled 2017-05-02 (×3): qty 16

## 2017-05-02 MED ORDER — DOCUSATE SODIUM 100 MG PO CAPS
100.0000 mg | ORAL_CAPSULE | Freq: Two times a day (BID) | ORAL | Status: DC
  Administered 2017-05-02 – 2017-05-10 (×17): 100 mg via ORAL
  Filled 2017-05-02 (×18): qty 1

## 2017-05-02 MED ORDER — ACETAMINOPHEN 325 MG PO TABS: 650.0000 mg | ORAL_TABLET | Freq: Four times a day (QID) | ORAL | Status: DC | PRN

## 2017-05-02 MED ORDER — PNEUMOCOCCAL VAC POLYVALENT 25 MCG/0.5ML IJ INJ: 0.5000 mL | INJECTION | INTRAMUSCULAR | Status: DC

## 2017-05-02 MED ORDER — MORPHINE SULFATE (PF) 2 MG/ML IV SOLN
2.0000 mg | INTRAVENOUS | Status: DC | PRN
  Administered 2017-05-02 – 2017-05-03 (×6): 2 mg via INTRAVENOUS
  Filled 2017-05-02 (×6): qty 1

## 2017-05-02 MED ORDER — ONDANSETRON HCL 4 MG PO TABS
4.0000 mg | ORAL_TABLET | Freq: Four times a day (QID) | ORAL | Status: DC | PRN
  Administered 2017-05-09: 4 mg via ORAL
  Filled 2017-05-02: qty 1

## 2017-05-02 MED ORDER — FERROUS SULFATE 325 (65 FE) MG PO TABS
325.0000 mg | ORAL_TABLET | Freq: Every day | ORAL | Status: DC
  Administered 2017-05-02 – 2017-05-17 (×14): 325 mg via ORAL
  Filled 2017-05-02 (×15): qty 1

## 2017-05-02 MED ORDER — DEXTROSE 5 % IV SOLN
5.0000 mg/h | INTRAVENOUS | Status: DC
  Filled 2017-05-02 (×2): qty 100

## 2017-05-02 MED ORDER — WARFARIN SODIUM 2.5 MG PO TABS: 2.5000 mg | ORAL_TABLET | Freq: Every day | ORAL | Status: DC

## 2017-05-02 MED ORDER — ONDANSETRON HCL 4 MG/2ML IJ SOLN
4.0000 mg | Freq: Four times a day (QID) | INTRAMUSCULAR | Status: DC | PRN
  Administered 2017-05-03 – 2017-05-09 (×11): 4 mg via INTRAVENOUS
  Filled 2017-05-02 (×12): qty 2

## 2017-05-02 MED ORDER — CLINDAMYCIN PHOSPHATE 600 MG/50ML IV SOLN
600.0000 mg | INTRAVENOUS | Status: AC
  Filled 2017-05-02: qty 50

## 2017-05-02 NOTE — Progress Notes (Signed)
ANTICOAGULATION CONSULT NOTE - Initial Consult  Pharmacy Consult for Heparin Drip/Bridging   Indication: atrial fibrillation  Allergies  Allergen Reactions  . Penicillins Itching and Rash    Other reaction(s): RASH  . Enalapril Other (See Comments)    cough  . Statins Other (See Comments)    Other reaction(s): MUSCLE PAIN    Patient Measurements: Height: 5' 8.5" (174 cm) Weight: 124 lb 4.8 oz (56.4 kg) IBW/kg (Calculated) : 65.05 Vital Signs: Temp: 98.7 F (37.1 C) (07/18 1220) Temp Source: Oral (07/18 1220) BP: 104/67 (07/18 1220) Pulse Rate: 88 (07/18 1220)  Labs:  Recent Labs  05/03/2017 0312 05/03/2017 0954  HGB 9.7*  --   HCT 28.8*  --   PLT 316  --   APTT  --  78*  LABPROT  --  36.7*  INR  --  3.59  CREATININE 1.40*  --   TROPONINI 0.03* 0.13*    Estimated Creatinine Clearance: 24.7 mL/min (A) (by C-G formula based on SCr of 1.4 mg/dL (H)).   Medical History: Past Medical History:  Diagnosis Date  . A-fib (HCC)    a. on Coumadin; b. CHADS2VASc => 5 (CHF, age x 2, vascular disease, female)  . Chronic systolic CHF (congestive heart failure) (HCC)   . Coronary artery disease    a. s/p CABG w/ SVG-PRDA  . S/P mitral valve clip implantation   . Status post aortic valve replacement     Assessment: 83 yo female with PMH of A.Fib, admitted with left femoral neck fracture secondary to fall. Patient takes warfarin, which has been placed on hold.  Pharmacy consulted for heparin dosing for bridge therapy for orthopedic surgery.   7/18 INR: 3.59  Goal of Therapy:  INR <2 Heparin level 0.3-0.7 units/ml Monitor platelets by anticoagulation protocol: Yes  Plan:  INR is currently supratheraputic. Once INR is less then 2, will initiate heparin 800u/hr.  INR ordered with AM labs.    Gardner CandleSheema M Venera Privott, PharmD, BCPS Clinical Pharmacist 04/18/2017 2:51 PM

## 2017-05-02 NOTE — ED Notes (Signed)
Pharmacy notified that cardizem needed STAT, spoke with Onalee Huaavid, states will send now.

## 2017-05-02 NOTE — Progress Notes (Signed)
Patient looks very uncomfortable. Her forehead is wrinkled in the middle and she makes slight noise with each breath.  She assures me she is not hurting and doesn't know why she looks uncomfortable.

## 2017-05-02 NOTE — H&P (Signed)
Cindy Weaver is an 83 y.o. female.   Chief Complaint: Fall HPI: The patient with past medical history of CHF and A. fib presents emergency department after suffering a fall. The patient states that she went to pick up something from the floor and then recalls only being on the ground and unable to stand. She does not recall losing consciousness but she did not hit her head. Unfortunately she felt pain in her left hip which was found to be fractured upon arrival to the emergency department. The patient was also in atrial fibrillation with rapid ventricular rate. She was given diltiazem 5 mg IV which improved her rate slightly. However her blood pressure remained mildly low. The patient also had a wet cough and chest x-ray demonstrating pulmonary edema. She was given Lasix IV prior to calling the hospitalist service for admission.  Past Medical History:  Diagnosis Date  . CHF (congestive heart failure) ([place])   . Coronary artery disease     Past Surgical History:  Procedure Laterality Date  . CORONARY ARTERY BYPASS GRAFT      History reviewed. No pertinent family history. Heart disease Social History:  reports that she quit smoking about 23 years ago. She has a 25.00 pack-year smoking history. She has quit using smokeless tobacco. She reports that she does not drink alcohol or use drugs.  Allergies:  Allergies  Allergen Reactions  . Penicillins Itching and Rash    Other reaction(s): RASH  . Enalapril Other (See Comments)    cough  . Statins Other (See Comments)    Other reaction(s): MUSCLE PAIN    Medications Prior to Admission  Medication Sig Dispense Refill  . ferrous sulfate 325 (65 FE) MG tablet Take 325 mg by mouth daily with breakfast.    . fluticasone (FLONASE) 50 MCG/ACT nasal spray Place 1 spray into both nostrils daily.    . Fluticasone-Salmeterol (ADVAIR) 250-50 MCG/DOSE AEPB Inhale 1 puff into the lungs 2 (two) times daily.    . furosemide (LASIX) 40 MG tablet Take 80 mg  by mouth daily.     . metoprolol tartrate (LOPRESSOR) 25 MG tablet Take 25 mg by mouth 2 (two) times daily.     . Multiple Vitamins-Minerals (MULTIVITAMIN WITH MINERALS) tablet Take 1 tablet by mouth daily.     . pantoprazole (PROTONIX) 40 MG tablet Take 40 mg by mouth daily.    . potassium chloride (K-DUR) 10 MEQ tablet Take 10 mEq by mouth daily.     . Vitamin K, Phytonadione, 100 MCG TABS Take 200 mcg by mouth daily.    Marland Kitchen warfarin (COUMADIN) 2.5 MG tablet Take 2.5 mg by mouth daily.      Results for orders placed or performed during the hospital encounter of 05/01/2017 (from the past 48 hour(s))  Basic metabolic panel     Status: Abnormal   Collection Time: 04/21/2017  3:12 AM  Result Value Ref Range   Sodium 137 135 - 145 mmol/L   Potassium 4.0 3.5 - 5.1 mmol/L   Chloride 99 (L) 101 - 111 mmol/L   CO2 28 22 - 32 mmol/L   Glucose, Bld 163 (H) 65 - 99 mg/dL   BUN 25 (H) 6 - 20 mg/dL   Creatinine, Ser 1.40 (H) 0.44 - 1.00 mg/dL   Calcium 8.9 8.9 - 10.3 mg/dL   GFR calc non Af Amer 32 (L) >60 mL/min   GFR calc Af Amer 38 (L) >60 mL/min    Comment: (NOTE) The eGFR has  been calculated using the CKD EPI equation. This calculation has not been validated in all clinical situations. eGFR's persistently <60 mL/min signify possible Chronic Kidney Disease.    Anion gap 10 5 - 15  CBC     Status: Abnormal   Collection Time: 04/19/2017  3:12 AM  Result Value Ref Range   WBC 15.2 (H) 3.6 - 11.0 K/uL   RBC 2.96 (L) 3.80 - 5.20 MIL/uL   Hemoglobin 9.7 (L) 12.0 - 16.0 g/dL   HCT 28.8 (L) 35.0 - 47.0 %   MCV 97.6 80.0 - 100.0 fL   MCH 32.9 26.0 - 34.0 pg   MCHC 33.7 32.0 - 36.0 g/dL   RDW 13.2 11.5 - 14.5 %   Platelets 316 150 - 440 K/uL  Troponin I     Status: Abnormal   Collection Time: 04/26/2017  3:12 AM  Result Value Ref Range   Troponin I 0.03 (HH) <0.03 ng/mL    Comment: CRITICAL RESULT CALLED TO, READ BACK BY AND VERIFIED WITH KUCH YUAL AT 0346 ON 04/16/2017 [place].   Brain natriuretic  peptide     Status: Abnormal   Collection Time: 04/29/2017  3:12 AM  Result Value Ref Range   B Natriuretic Peptide 485.0 (H) 0.0 - 100.0 pg/mL   Dg Chest Port 1 View  Result Date: 04/17/2017 CLINICAL DATA:  Fall EXAM: PORTABLE CHEST 1 VIEW COMPARISON:  Chest radiograph 11/24/2012 FINDINGS: Cardiomegaly is worsened from the prior study. There bilateral interstitial opacities, worst in the left lung base. No pneumothorax or sizable pleural effusion. No focal consolidation. IMPRESSION: Cardiomegaly, and calcific aortic atherosclerosis and suspected mild interstitial edema. Electronically Signed   By: Ulyses Jarred M.D.   On: 05/15/2017 03:56   Dg Hip Unilat W Or Wo Pelvis 2-3 Views Left  Result Date: 04/25/2017 CLINICAL DATA:  Left hip pain after falling EXAM: DG HIP (WITH OR WITHOUT PELVIS) 2-3V LEFT COMPARISON:  None. FINDINGS: There is a transverse fracture of the left femoral neck with associated foreshortening. The femoral head remains approximated to the acetabulum. The right hip is unremarkable. No pelvic diastasis. IMPRESSION: Transverse fracture of the left femoral neck. Electronically Signed   By: Ulyses Jarred M.D.   On: 04/25/2017 04:24    Review of Systems  Constitutional: Negative for chills and fever.  HENT: Negative for sore throat and tinnitus.   Eyes: Negative for blurred vision and redness.  Respiratory: Negative for cough and shortness of breath.   Cardiovascular: Negative for chest pain, palpitations, orthopnea and PND.  Gastrointestinal: Negative for abdominal pain, diarrhea, nausea and vomiting.  Genitourinary: Negative for dysuria, frequency and urgency.  Musculoskeletal: Positive for falls and joint pain. Negative for myalgias.  Skin: Negative for rash.       No lesions  Neurological: Negative for speech change, focal weakness and weakness.  Endo/Heme/Allergies: Does not bruise/bleed easily.       No temperature intolerance  Psychiatric/Behavioral: Negative for  depression and suicidal ideas.    Blood pressure 97/68, pulse (!) 125, temperature 98.4 F (36.9 C), temperature source Oral, resp. rate 18, height 5' 8.5" (1.74 m), weight 56.4 kg (124 lb 4.8 oz), SpO2 97 %. Physical Exam  Vitals reviewed. Constitutional: She is oriented to person, place, and time. She appears well-developed and well-nourished. No distress.  HENT:  Head: Normocephalic and atraumatic.  Mouth/Throat: Oropharynx is clear and moist.  Eyes: Pupils are equal, round, and reactive to light. Conjunctivae and EOM are normal. No scleral icterus.  Neck:  Normal range of motion. Neck supple. No JVD present. No tracheal deviation present. No thyromegaly present.  Cardiovascular: Normal rate, regular rhythm and normal heart sounds.  Exam reveals no gallop and no friction rub.   No murmur heard. Respiratory: Effort normal and breath sounds normal.  GI: Soft. Bowel sounds are normal. She exhibits no distension. There is no tenderness.  Genitourinary:  Genitourinary Comments: Deferred  Musculoskeletal: She exhibits no edema.  Left leg rotated; painful with attempted hip flexion  Lymphadenopathy:    She has no cervical adenopathy.  Neurological: She is alert and oriented to person, place, and time. No cranial nerve deficit. She exhibits normal muscle tone.  Skin: Skin is warm and dry. No rash noted. No erythema.  Psychiatric: She has a normal mood and affect. Her behavior is normal. Judgment and thought content normal.     Assessment/Plan This is an 83 year old female admitted for atrial fibrillation with rapid ventricular rate. 1. A. fib: Rapid ventricular rate. Will start the patient on amiodarone drip without bolus due to relatively soft pressure. Continue to monitor telemetry. Patient is on warfarin for anticoagulation. 2. CHF: Acute on chronic; systolic. Exacerbated by rapid ventricular rate. Expect improvement with rate control. 3. Hip fracture: Pain management; consult orthopedic  surgery. The patient is comfortable stopping warfarin in order to undergo surgery. 4. CAD: Stable; no aspirin due to fall risk 5. Hypertension: Resume metoprolol pressure will allow. 6. Asthma: Stable. Continue inhaled corticosteroid. 7. DVT prophylaxis: As above 8. GI prophylaxis: Pantoprazole per home regimen The patient is a full code. Time spent on admission orders and patient care approximately 45 minutes  Harrie Foreman, MD 05/12/2017, 7:38 AM

## 2017-05-02 NOTE — Progress Notes (Signed)
[facility]   PATIENT NAME: Cindy Weaver    MR#:  [id number]  DATE OF BIRTH:  09-08-1926  SUBJECTIVE:  CHIEF COMPLAINT:   Chief Complaint  Patient presents with  . Fall  . Cough   -Admitted after a fall and left femoral neck fracture. Noted to be in A. fib with RVR. -Denies any chest pain or shortness of breath. Complains of leg pain. Was on Coumadin at home which is held.  REVIEW OF SYSTEMS:  Review of Systems  Constitutional: Positive for malaise/fatigue. Negative for chills and fever.  HENT: Negative for ear discharge, hearing loss, nosebleeds and sore throat.   Eyes: Negative for blurred vision and double vision.  Respiratory: Negative for cough, shortness of breath and wheezing.   Cardiovascular: Negative for chest pain, palpitations and leg swelling.  Gastrointestinal: Negative for abdominal pain, constipation, diarrhea, nausea and vomiting.  Genitourinary: Negative for dysuria.  Musculoskeletal: Positive for joint pain and myalgias.  Neurological: Negative for dizziness, seizures and headaches.    DRUG ALLERGIES:   Allergies  Allergen Reactions  . Penicillins Itching and Rash    Other reaction(s): RASH  . Enalapril Other (See Comments)    cough  . Statins Other (See Comments)    Other reaction(s): MUSCLE PAIN    VITALS:  Blood pressure 104/67, pulse 88, temperature 98.7 F (37.1 C), temperature source Oral, resp. rate 20, height 5' 8.5" (1.74 m), weight 56.4 kg (124 lb 4.8 oz), SpO2 98 %.  PHYSICAL EXAMINATION:  Physical Exam  GENERAL:  83 y.o.-year-old patient lying in the bed with no acute distress.  EYES: Pupils equal, round, reactive to light and accommodation. No scleral icterus. Extraocular muscles intact.  HEENT: Head atraumatic, normocephalic. Oropharynx and nasopharynx clear.  NECK:  Supple, no jugular venous distention. No thyroid enlargement, no tenderness.  LUNGS: Normal breath sounds bilaterally, no  wheezing, rales,rhonchi or crepitation. No use of accessory muscles of respiration. Scant breath sounds at the bases CARDIOVASCULAR: S1, S2 normal. No  rubs, or gallops. Loud 4/6 systolic murmur heard ABDOMEN: Soft, nontender, nondistended. Bowel sounds present. No organomegaly or mass.  EXTREMITIES: No pedal edema, cyanosis, or clubbing.  NEUROLOGIC: Cranial nerves II through XII are intact. Muscle strength 5/5 in all extremities except left leg due to pain from fracture. Sensation intact. Gait not checked.  PSYCHIATRIC: The patient is alert and oriented x 3.  SKIN: No obvious rash, lesion, or ulcer.    LABORATORY PANEL:   CBC  Recent Labs Lab 04/27/2017 0312  WBC 15.2*  HGB 9.7*  HCT 28.8*  PLT 316   ------------------------------------------------------------------------------------------------------------------  Chemistries   Recent Labs Lab 04/15/2017 0312 05/09/2017 0954  NA 137  --   K 4.0  --   CL 99*  --   CO2 28  --   GLUCOSE 163*  --   BUN 25*  --   CREATININE 1.40*  --   CALCIUM 8.9  --   MG  --  1.7   ------------------------------------------------------------------------------------------------------------------  Cardiac Enzymes  Recent Labs Lab 05/01/2017 0954  TROPONINI 0.13*   ------------------------------------------------------------------------------------------------------------------  RADIOLOGY:  Dg Chest Port 1 View  Result Date: 05/12/2017 CLINICAL DATA:  Fall EXAM: PORTABLE CHEST 1 VIEW COMPARISON:  Chest radiograph 11/24/2012 FINDINGS: Cardiomegaly is worsened from the prior study. There bilateral interstitial opacities, worst in the left lung base. No pneumothorax or sizable pleural effusion. No focal consolidation. IMPRESSION: Cardiomegaly, and calcific aortic atherosclerosis and suspected mild interstitial edema. Electronically Signed  By: Deatra RobinsonKevin  Herman M.D.   On: 05/03/2017 03:56   Dg Hip Unilat W Or Wo Pelvis 2-3 Views Left  Result  Date: 05/12/2017 CLINICAL DATA:  Left hip pain after falling EXAM: DG HIP (WITH OR WITHOUT PELVIS) 2-3V LEFT COMPARISON:  None. FINDINGS: There is a transverse fracture of the left femoral neck with associated foreshortening. The femoral head remains approximated to the acetabulum. The right hip is unremarkable. No pelvic diastasis. IMPRESSION: Transverse fracture of the left femoral neck. Electronically Signed   By: Deatra RobinsonKevin  Herman M.D.   On: 04/15/2017 04:24    EKG:   Orders placed or performed during the hospital encounter of 04/26/2017  . ED EKG  . ED EKG    ASSESSMENT AND PLAN:   83 year old female with multiple medical problems including A. fib on Coumadin, CAD status post CABG, mitral valve clipping for severe MR, bioprosthetic aortic valve replacement surgery, chronic respiratory failure on 2 L home oxygen, Systolic CHF with EF of 45% presents to the hospital secondary to fall and noted to have left hip fracture.  #1 atrial fibrillation with rapid ventricular response-likely triggered by her acute systolic failure. -Due to soft blood pressures, Cardizem 1 metoprolol cannot be started at this time. Continue amiodarone drip. -Appreciate cardiology consult. Will be started on digoxin. -Due to her surgery, Coumadin on hold. He'll be started on heparin drip until prior to surgery For bridging if ok by orthopedics -Echocardiogram ordered  #2 acute on chronic systolic CHF-continue IV Lasix Check echocardiogram. Metoprolol held due to hypotension. Monitor closely. -Appreciate cardiology consult  #3 acute on chronic respiratory failure with hypoxia-secondary to systolic CHF-acute and chronic. -Also COPD exacerbation. -Tinea to wean oxygen. Currently requiring 5 L. Continue nebulizers and add steroids if needed.  #4 acute renal failure-Baseline creatinine seems to be between 1.2-1.3 -Currently at 1.4. Could be cardiorenal. Monitor, avoid nephrotoxins  #5 left hip fracture-secondary to  mechanical fall. Order has been consulted. -Patient not ready for surgery yet. Continue to wait for cardiology clearance and for her heart rate and failure to improve. -Coumadin will be held, will be on heparin drip. Continue pain medications for pain control.  #6 DVT prophylaxis - will need surgery and will start heparin drip if ok by ortho   All the records are reviewed and case discussed with Care Management/Social Workerr. Management plans discussed with the patient, family and they are in agreement.  CODE STATUS: Full code  TOTAL TIME TAKING CARE OF THIS PATIENT: 37 minutes.   POSSIBLE D/C IN 3-4 DAYS, DEPENDING ON CLINICAL CONDITION.   Enid BaasKALISETTI,Doreena Maulden M.D on 04/22/2017 at 2:29 PM  Between 7am to 6pm - Pager - [phone]  After 6pm go to [email or web address] - Social research officer, governmentpassword EPAS [facility]  [facility] [place] [place] [facility]  Office  [phone]  CC: Primary care physician; Elita QuickHill, [facility]

## 2017-05-02 NOTE — Progress Notes (Signed)
*  PRELIMINARY RESULTS* Echocardiogram 2D Echocardiogram has been performed.  Cristela BlueHege, Raife Lizer 08-17-2017, 1:29 PM

## 2017-05-02 NOTE — ED Notes (Signed)
Pt given 5mg  of diltiazem per MD order.

## 2017-05-02 NOTE — Consult Note (Signed)
ORTHOPAEDIC CONSULTATION  PATIENT NAME: Cindy Weaver DOB: 1925/10/21  MRN: [id number]  REQUESTING PHYSICIAN: Enid Baas, MD  Chief Complaint: Left hip pain  HPI: Cindy Weaver is a 83 y.o. female who was at home and states that she was leaning forward to pick up an item from the floor when she fell. She had the immediate onset of severe left hip pain and was unable stand or bear weight due to the pain. She denies any other injuries. She denies any gross loss of consciousness.  Past Medical History:  Diagnosis Date  . A-fib (HCC)    a. on Coumadin; b. CHADS2VASc => 5 (CHF, age x 2, vascular disease, female)  . Chronic systolic CHF (congestive heart failure) (HCC)   . Coronary artery disease    a. s/p CABG w/ SVG-PRDA  . S/P mitral valve clip implantation   . Status post aortic valve replacement    Past Surgical History:  Procedure Laterality Date  . CORONARY ARTERY BYPASS GRAFT     Social History   Social History  . Marital status: Widowed    Spouse name: N/A  . Number of children: N/A  . Years of education: N/A   Social History Main Topics  . Smoking status: Former Smoker    Packs/day: 1.00    Years: 25.00    Quit date: 10/16/1993  . Smokeless tobacco: Former Neurosurgeon  . Alcohol use No  . Drug use: No  . Sexual activity: Not Asked   Other Topics Concern  . None   Social History Narrative  . None   Family History  Problem Relation Age of Onset  . CAD Mother    Allergies  Allergen Reactions  . Penicillins Itching and Rash    Other reaction(s): RASH  . Enalapril Other (See Comments)    cough  . Statins Other (See Comments)    Other reaction(s): MUSCLE PAIN   Prior to Admission medications   Medication Sig Start Date End Date Taking? Authorizing Provider  ferrous sulfate 325 (65 FE) MG tablet Take 325 mg by mouth daily with breakfast.   Yes [provider]  fluticasone (FLONASE) 50 MCG/ACT nasal spray Place 1 spray into both nostrils daily.    Yes [provider]  Fluticasone-Salmeterol (ADVAIR) 250-50 MCG/DOSE AEPB Inhale 1 puff into the lungs 2 (two) times daily.   Yes [provider]  furosemide (LASIX) 40 MG tablet Take 80 mg by mouth daily.    Yes [provider]  metoprolol tartrate (LOPRESSOR) 25 MG tablet Take 25 mg by mouth 2 (two) times daily.    Yes [provider]  Multiple Vitamins-Minerals (MULTIVITAMIN WITH MINERALS) tablet Take 1 tablet by mouth daily.    Yes [provider]  pantoprazole (PROTONIX) 40 MG tablet Take 40 mg by mouth daily.   Yes [provider]  potassium chloride (K-DUR) 10 MEQ tablet Take 10 mEq by mouth daily.    Yes [provider]  Vitamin K, Phytonadione, 100 MCG TABS Take 200 mcg by mouth daily.   Yes [provider]  warfarin (COUMADIN) 2.5 MG tablet Take 2.5 mg by mouth daily.   Yes [provider]   Dg Chest Port 1 View  Result Date: 05/06/2017 CLINICAL DATA:  Fall EXAM: PORTABLE CHEST 1 VIEW COMPARISON:  Chest radiograph 11/24/2012 FINDINGS: Cardiomegaly is worsened from the prior study. There bilateral interstitial opacities, worst in the left lung base. No pneumothorax or sizable pleural effusion. No focal consolidation. IMPRESSION: Cardiomegaly,  and calcific aortic atherosclerosis and suspected mild interstitial edema. Electronically Signed   By: Deatra RobinsonKevin  Herman M.D.   On: 10/22/16 03:56   Dg Hip Unilat W Or Wo Pelvis 2-3 Views Left  Result Date: 12/13/2016 CLINICAL DATA:  Left hip pain after falling EXAM: DG HIP (WITH OR WITHOUT PELVIS) 2-3V LEFT COMPARISON:  None. FINDINGS: There is a transverse fracture of the left femoral neck with associated foreshortening. The femoral head remains approximated to the acetabulum. The right hip is unremarkable. No pelvic diastasis. IMPRESSION: Transverse fracture of the left femoral neck. Electronically Signed   By: Deatra RobinsonKevin  Herman M.D.   On: 10/22/16 04:24    Positive  ROS: All other systems have been reviewed and were otherwise negative with the exception of those mentioned in the HPI and as above.  Physical Exam: General: Well developed, well nourished female seen in no acute distress. HEENT: Atraumatic and normocephalic. Sclera are clear. Extraocular motion is intact. Oropharynx is clear with moist mucosa. Neck: Supple, nontender, good range of motion. No JVD or carotid bruits. Lungs: Bilateral rhonchi.. Cardiovascular: Irregular rate and rhythm with normal S1 and S2. 3/6 systolic murmur. No gallops or rubs. Pedal pulses are palpable bilaterally. Homans test is negative bilaterally. No significant pretibial or ankle edema. Abdomen: Soft, nontender, and nondistended. Bowel sounds are present. Skin: No lesions in the area of chief complaint Neurologic: Awake, alert, and oriented. Sensory function is grossly intact. Motor strength is felt to be 5 over 5 bilaterally with the exception of the left lower extremity which was not assessed secondary to the injury.. No clonus or tremor. Good motor coordination. Lymphatic: No axillary or cervical lymphadenopathy  MUSCULOSKELETAL: Examination of the left lower extremity demonstrates the limb to be shortened and externally rotated. Pain is elicited with any attempted range of motion of the left hip. No tenderness to palpation about the knee or ankle. No knee effusion.  Radiographs: I reviewed radiographs of the pelvis and left hip. There is a displaced left femoral neck fracture. Diffuse osteopenia is appreciated.  Assessment: Displaced left femoral neck fracture  Plan: The findings were discussed in detail with the patient and her family. The usual perioperative course was discussed. The risks and benefits of surgical intervention were reviewed. The patient expressed understanding of the risks and benefits and agreed with plans for surgical intervention.   The patient is currently being treated by Medicine and  Cardiology for atrial fibrillation with rapid ventricular rate. Chest x-ray also demonstrates findings consistent with interstitial edema as well as worsening cardiomegaly. She has also been chronically anticoagulated on Coumadin with an INR of 3.59. Coumadin has been discontinued and the patient is on a heparin drip. I would like to see the patient optimized for surgery with control of her ventricular rate and normalization of her INR.  I will follow with you. We will make definitive plans for surgery when she has been optimized for surgery.  James P. Angie FavaHooten, Jr. M.D.

## 2017-05-02 NOTE — ED Triage Notes (Signed)
Pt presents to ED via for mechanical fall; complaints of left mid thigh pain. Pt also c/o cough and chest discomfort.

## 2017-05-02 NOTE — Consult Note (Signed)
Cardiology Consultation Note  Patient ID: Cindy PairLiller W Kittrell, MRN: [id number], DOB/AGE: 81/05/1926 81 y.o. Admit date: 05/04/2017   Date of Consult: 04/30/2017 Primary Physician: Elita QuickHill, [facility] Primary Cardiologist: [facility] Requesting Physician: Dr. Nemiah CommanderKalisetti, MD  Chief Complaint: Mechanical fall Reason for Consult: Afib with RVR, acute on chronic systolic CHF, elevated troponin, and pre-operative cardiac evaluation  HPI: Cindy Weaver is a 83 y.o. female who is being seen today for the evaluation of Afib with RVR, acute on chronic systolic CHF, elevated troponin, and pre-operative cardiac evaluation at the request of Dr. Nemiah CommanderKalisetti, MD. Patient has a h/o CAD s/p 1-vessel CABG with SVG-RPDA, chronic systolic CHF with prior EF of 40%45% improved to 60% by TTE in 08/2016, status post mechanical aortic valve replacement in 1995 s/p bioprosthetic aortic valve replacement in 2013, severe mitral regurgitation s/p mitral valve clipping 04/2015, chronic Afib on Coumadin with goal INR 2.5-3.5, chronic respiratory failure with hypoxia on home oxygen at 2 L via nasal cannula 2/2 COPD 2/2 prior tobacco abuse, orthostatic hypotension, prior GI bleed In the setting of supratherapeutic INR greater than 4, vertigo, HTN, and GERD who [facility] with a mechanical fall on the night of 7/17 leading to a transverse fracture of the left femoral neck.   [facility]. Most recent ischemic evaluation by cardiac catheterization on 04/05/2015 showed significant 1 vessel coronary artery disease with an occluded distal RCA with medical management advised. Patent SVG to RPDA, normal LV function, severe mitral regurgitation, and moderate pulmonary hypertension. [facility] and doing well at that time. She continued to note orthostatic hypotension, having previously gone from metoprolol 75 mg twice a day to 25 mg twice a day. She continued to take by mouth Lasix 80-120 mg  daily. At that time, her Lasix regimen was simplified to 40 mg daily. Her heart failure symptoms were much improved after her mitral valve clipping in 2016. She remained in A. fib at that time on Coumadin, rate controlled.  Upon the [facility]'s [facility] they were found to have BP 101/76, HR 156 bpm, temp 98.5, oxygen saturation 95% on nasal cannula, weight 124 pounds. EKG showed Afib with RVR, 151 bpm, inferolateral TWI, anterior horizontal st depression, CXR showed cardiomegaly with calcific aortic atherosclerosis and mild interstitial edema. Hip plain film showed a transverse fracture of the left femoral neck. Labs showed an initial troponin of 0.03, BNP 485, WBC 15.2, HGB 9.7, PLT 316, SCr 1.40 (baseline unknown), K+ 4.0. Since her admission she has been in Afib with RVR with heart rates ranging from the low 100s to 150s bpm. She has also been hypoxic with oxygen saturations in the low 90% on 5 L via nasal cannula. She has resceived IV diltiazem 10 mg x 1 at 4 AM with mild improvement in heart rate. She has been hypotensive with BP in the mis 90s systolic which has precluded further diltiazem (including gtt) or beta blocker therapy. She was started on amiodarone infusion, without bolus, by IM at time of admission for rate control with mild improvement in her heart rates to the 110s to 120s bpm. She does not feel any palpitations. She has been given IV Lasix 40 mg x 1 upon admission with a documented UOP of 0 L. Her Coumadin has been held by Cardiology and she has been placed on a heparin gtt. Vitamin K has been ordered by IM. INR is pending at time of  cardiology consult. She is currently without chest pain or palpitations. She does note mild SOB and significant left hip pain.   Past Medical History:  Diagnosis Date  . A-fib (HCC)    a. on Coumadin; b. CHADS2VASc => 5 (CHF, age x 2, vascular disease, female)  . Chronic systolic CHF (congestive heart failure) (HCC)   . Coronary artery disease    a.  s/p CABG w/ SVG-PRDA  . S/P mitral valve clip implantation   . Status post aortic valve replacement       Most Recent Cardiac Studies: Echo:  06/16/15 TTE History of mitral valve repair with MitraClip 04/2015, trivial MR Aortic valve replacement (23 mm MitroFlow bioprosthesis, 2013) Mildly decreased left ventricular systolic function, ejection fraction 45 to 50%  04/2715 TTE (post-Mitraclip) EF 45%, LVH, trivial MR with one Mitraclip device, mean transmitral gradient is 7, mild TR, AVR (23mm Mitroflow)  04/08/15 TEE: Mitral valve prolapse Mitral regurgitation (severe) - The mitral valve is thickened and redundant with anterior leaflet prolapse of moderate degree. There is severe, eccentric mitral regurgitation by color flow and continuous wave Doppler examinations.  Dilated left atrium (mild) No detectable intracardiac shunt No left or right atrial appendage thrombus  03/26/15 TTE: Decreased left ventricular ejection fraction (45-50%) Left ventricular hypertrophy Diastolic left ventricular dysfunction Degenerative mitral valve disease Mitral regurgitation (mild) Dilated left atrium Biosprosthetic aortic valve (see below) Pulmonary regurgitation (mild) Normal right ventricular contractile performance Tricuspid regurgitation (mild) Pulmonary hypertension (mild - see detail Below)  08/16/16 TTE:  Limited study to evaluate valves  Technically difficult study due to chest wall/lung interference  Left ventricular hypertrophy - mild  Normal left ventricular systolic function, ejection fraction 55 to 60%  S/p MitraClip 05/11/2015  Mitral regurgitation - mild  Dilated left atrium - moderate to severe  Aortic valve replacement (23 mm Mitroflow bioprosthetic valve implanted 04/2012)  Normal right ventricular systolic function  Dilated right atrium - mild  CATH:  04/05/15 1. Significant 1-vessel coronary artery disease with an occluded distal  RCA >> medical mgmt 2. Patent  SVG to rPDA 3. Normal LV function 4. Severe MR (4+) 5. Moderate pulmonary HTN from elevated LVEDP 6. Elevated PCWP with v-wave to 40 mmHg 7. Concordance of left and right ventricular pressures   Surgical History:  Past Surgical History:  Procedure Laterality Date  . CORONARY ARTERY BYPASS GRAFT       Home Meds: Prior to Admission medications   Medication Sig Start Date End Date Taking? Authorizing Provider  ferrous sulfate 325 (65 FE) MG tablet Take 325 mg by mouth daily with breakfast.   Yes [provider]  fluticasone (FLONASE) 50 MCG/ACT nasal spray Place 1 spray into both nostrils daily.   Yes [provider]  Fluticasone-Salmeterol (ADVAIR) 250-50 MCG/DOSE AEPB Inhale 1 puff into the lungs 2 (two) times daily.   Yes [provider]  furosemide (LASIX) 40 MG tablet Take 80 mg by mouth daily.    Yes [provider]  metoprolol tartrate (LOPRESSOR) 25 MG tablet Take 25 mg by mouth 2 (two) times daily.    Yes [provider]  Multiple Vitamins-Minerals (MULTIVITAMIN WITH MINERALS) tablet Take 1 tablet by mouth daily.    Yes [provider]  pantoprazole (PROTONIX) 40 MG tablet Take 40 mg by mouth daily.   Yes [provider]  potassium chloride (K-DUR) 10 MEQ tablet Take 10 mEq by mouth daily.    Yes [provider]  Vitamin K, Phytonadione, 100 MCG TABS  Take 200 mcg by mouth daily.   Yes [provider]  warfarin (COUMADIN) 2.5 MG tablet Take 2.5 mg by mouth daily.   Yes [provider]    Inpatient Medications:  . digoxin  0.25 mg Intravenous Once  . docusate sodium  100 mg Oral BID  . ferrous sulfate  325 mg Oral Q breakfast  . fluticasone  1 spray Each Nare Daily  . furosemide  80 mg Oral Daily  . metoprolol tartrate  25 mg Oral BID  . mometasone-formoterol  2 puff Inhalation BID  . multivitamin with minerals  1 tablet Oral Daily  . pantoprazole  40 mg Oral Daily  . phytonadione  0.2  mg Oral Daily  . [START ON 05/03/2017] pneumococcal 23 valent vaccine  0.5 mL Intramuscular Tomorrow-1000  . potassium chloride  10 mEq Oral Daily   . amiodarone 60 mg/hr (05/06/2017 0627)  . amiodarone    . clindamycin (CLEOCIN) IV      Allergies:  Allergies  Allergen Reactions  . Penicillins Itching and Rash    Other reaction(s): RASH  . Enalapril Other (See Comments)    cough  . Statins Other (See Comments)    Other reaction(s): MUSCLE PAIN    Social History   Social History  . Marital status: Widowed    Spouse name: N/A  . Number of children: N/A  . Years of education: N/A   Occupational History  . Not on file.   Social History Main Topics  . Smoking status: Former Smoker    Packs/day: 1.00    Years: 25.00    Quit date: 10/16/1993  . Smokeless tobacco: Former Neurosurgeon  . Alcohol use No  . Drug use: No  . Sexual activity: Not on file   Other Topics Concern  . Not on file   Social History Narrative  . No narrative on file     Family History  Problem Relation Age of Onset  . CAD Mother      Review of Systems: Review of Systems  Constitutional: Positive for malaise/fatigue. Negative for chills, diaphoresis, fever and weight loss.  HENT: Negative for congestion.   Eyes: Negative for discharge and redness.  Respiratory: Positive for shortness of breath. Negative for cough, hemoptysis, sputum production and wheezing.   Cardiovascular: Negative for chest pain, palpitations, orthopnea, claudication, leg swelling and PND.  Gastrointestinal: Negative for abdominal pain, blood in stool, heartburn, melena, nausea and vomiting.  Genitourinary: Negative for hematuria.  Musculoskeletal: Positive for falls, joint pain and myalgias.  Skin: Negative for rash.  Neurological: Positive for weakness. Negative for dizziness, tingling, tremors, sensory change, speech change, focal weakness and loss of consciousness.  Endo/Heme/Allergies: Does not bruise/bleed easily.    Psychiatric/Behavioral: Negative for substance abuse. The patient is not nervous/anxious.   All other systems reviewed and are negative.   Labs:  Recent Labs  04/24/2017 0312  TROPONINI 0.03*   Lab Results  Component Value Date   WBC 15.2 (H) 05/10/2017   HGB 9.7 (L) 04/28/2017   HCT 28.8 (L) 05/07/2017   MCV 97.6 04/21/2017   PLT 316 04/27/2017     Recent Labs Lab 05/13/2017 0312  NA 137  K 4.0  CL 99*  CO2 28  BUN 25*  CREATININE 1.40*  CALCIUM 8.9  GLUCOSE 163*   No results found for: CHOL, HDL, LDLCALC, TRIG No results found for: DDIMER  Radiology/Studies:  Dg Chest Port 1 View  Result Date: 05/01/2017 CLINICAL DATA:  Fall EXAM: PORTABLE  CHEST 1 VIEW COMPARISON:  Chest radiograph 11/24/2012 FINDINGS: Cardiomegaly is worsened from the prior study. There bilateral interstitial opacities, worst in the left lung base. No pneumothorax or sizable pleural effusion. No focal consolidation. IMPRESSION: Cardiomegaly, and calcific aortic atherosclerosis and suspected mild interstitial edema. Electronically Signed   By: Deatra Robinson M.D.   On: 04/22/2017 03:56   Dg Hip Unilat W Or Wo Pelvis 2-3 Views Left  Result Date: 04/28/2017 CLINICAL DATA:  Left hip pain after falling EXAM: DG HIP (WITH OR WITHOUT PELVIS) 2-3V LEFT COMPARISON:  None. FINDINGS: There is a transverse fracture of the left femoral neck with associated foreshortening. The femoral head remains approximated to the acetabulum. The right hip is unremarkable. No pelvic diastasis. IMPRESSION: Transverse fracture of the left femoral neck. Electronically Signed   By: Deatra Robinson M.D.   On: 05/03/2017 04:24    EKG: Interpreted by me showed: Afib with RVR, 151 bpm, inferolateral TWI, anterior horizontal st depression Telemetry: Interpreted by me showed: Afib with RVRm 110s to 150s bpm, currently with heart rates in the 120s to 130s bpm  Weights: Filed Weights   05/15/2017 0306 04/29/2017 0606  Weight: 124 lb (56.2 kg)  124 lb 4.8 oz (56.4 kg)     Physical Exam: Blood pressure (!) 94/57, pulse (!) 131, temperature 98.3 F (36.8 C), temperature source Oral, resp. rate (!) 22, height 5' 8.5" (1.74 m), weight 124 lb 4.8 oz (56.4 kg), SpO2 93 %. Body mass index is 18.62 kg/m. General: Frail appearing, in no acute distress. Head: Normocephalic, atraumatic, sclera non-icteric, no xanthomas, nares are without discharge.  Neck: Negative for carotid bruits. JVD elevated elevated ~ 8 cm. Lungs: Diminished breath sounds bilaterally with faint bibasilar crackles. Breathing is unlabored. On 5 L via nasal cannula.  Heart: Tachycardic, irregularly irregular with S1 S2. II/VI systolic murmur, no rubs, or gallops appreciated. Abdomen: Soft, non-tender, non-distended with normoactive bowel sounds. No hepatomegaly. No rebound/guarding. No obvious abdominal masses. Msk:  Strength and tone appear normal for age. Extremities: No clubbing or cyanosis. No edema. Distal pedal pulses are 2+ and equal bilaterally. Neuro: Alert and oriented X 3. No facial asymmetry. No focal deficit. Moves all extremities spontaneously. Psych:  Responds to questions appropriately with a normal affect.    Assessment and Plan:  Principal Problem:   Hip fracture (HCC) Active Problems:   Atrial fibrillation with RVR (HCC)   Acute on chronic systolic CHF (congestive heart failure) (HCC)   Acute on chronic respiratory failure with hypoxia (HCC)   Anemia   Status post aortic valve replacement   Status post mitral valve repair   AKI (acute kidney injury) (HCC)   History of Coumadin therapy   Elevated troponin   Hypotension    1. Afib with RVR: - Rates remained suboptimally controlled in the 1 teens to 120s bpm - Soft blood pressure precludes continuation of metoprolol or addition of diltiazem - Would continue full dose amiodarone infusion for rate control at this time - Will give one-time dose of IV digoxin 0.25 mg along with checking a  digoxin level on 7/19 - Hold Coumadin in preparation for orthopedic surgery as below - Start heparin drip - CHADS2VASc at least 5 (CHF, age x 2, vascular disease, female) -Would ideally like to see her heart rate at least in the low 100s bpm prior to surgery   2. Acute on chronic systolic CHF: - Continue with diuresis along with potassium repletion - Check transthoracic echocardiogram - Metoprolol on hold  given hypotension as below - Digoxin as above - Has previously not been on ACEi/ARB/ANRI in the setting of orthostatic hypotension  3. Elevated troponin/CAD: - Initial troponin mildly elevated at 0.03 - Continue to cycle troponin until peak - On heparin drip as above given her Afib - Check transthoracic echocardiogram to evaluate LV systolic function and wall motion - If there is not dynamic troponin elevation, along with normal echocardiogram, would not pursue further inpatient ischemic evaluation at this time - Has been on Coumadin in place of aspirin - Continue metoprolol as blood pressure will allow - Consider addition of statin  4. Acute on chronic respiratory failure with hypoxia: - Likely multifactorial including acute on chronic systolic CHF, A. fib with RVR, anemia, and possible component of acute exacerbation of COPD - Wean oxygen to baseline 2L by nasal cannula per IM - Nebs and inhaled corticosteroids per IM  5. AKI: - Unknown baseline (most recent bmet from her care everywhere was 2005) - Possibly ATN in the setting of dehydration - Monitor - Check digoxin level as above  6. Anemia: - Unknown baseline - Monitor - Maintain hemoglobin greater than 8.5  7. Hip fracture: - Orthopedics planning for surgery once her rate is improved to at least the low 100s bpm, SER status has improved, and INR is acceptable - Management of pain will be important in her rate control  8. Leukocytosis: - No obvious signs of infection - Likely inflammatory -Per IM   9.  Hypotension: - Hold parameters given for metoprolol - If needed, could use low-dose pressor support in the perioperative timeframe  10. PREOPERATIVE CARDIAC RISK ASSESSMENT:   Revised Cardiac Risk Index:  High Risk Surgery (defined as Intraperitoneal, intrathoracic or suprainguinal vascular): no; (hip repair)  Active CAD: no  CHF: yes  Cerebrovascular Disease: no   Diabetes: no; On Insulin: no  CKD (Cr >~ 2): no   Total: 1 Estimated Risk of Adverse Outcome: moderate risk for moderate risk surgery. Estimated Risk of MI, PE, VF/VT (Cardiac Arrest), Complete Heart Block: 6.6 %   ACC/AHA Guidelines for "Clearance":  Step 1 - Need for Emergency Surgery: no   If Yes - go straight to OR with perioperative surveillance  Step 2 - Active Cardiac Conditions (Unstable Angina, Decompensated HF, Significant  Arrhytmias - Complete HB, Mobitz II, Symptomatic VT or SVT, Severe Aortic Stenosis - mean gradient > 40 mmHg, Valve area < 1.0 cm2): yes   If Yes - Evaluate & Treat per ACC/AHA Guidelines  Step 3 -  Low Risk Surgery:   If Yes --> proceed to OR  If No --> Step 4  Step 4 - Functional Capacity >= 4 METS without symptoms:   If Yes --> proceed to OR  If No --> Step 5  Step 5 --  Clinical Risk Factors (CRF)  - Zero --> proceed to OR   Signed, Eula Listen, PA-C [facility] Pager: [phone] May 28, 2017, 9:58 AM

## 2017-05-02 NOTE — Progress Notes (Signed)
Patient transferred from the ED via stretcher into room 246 on 2A. Oriented patient to room and rome equipment. Patient given pain medication in the ED per assigned nurse for pain and discomfort. Assisgned nurse will continue to monitor patient to end of shift.

## 2017-05-03 ENCOUNTER — Inpatient Hospital Stay: Payer: Medicare Other

## 2017-05-03 LAB — BASIC METABOLIC PANEL
ANION GAP: 11 (ref 5–15)
BUN: 31 mg/dL — ABNORMAL HIGH (ref 6–20)
CHLORIDE: 94 mmol/L — AB (ref 101–111)
CO2: 29 mmol/L (ref 22–32)
Calcium: 8.5 mg/dL — ABNORMAL LOW (ref 8.9–10.3)
Creatinine, Ser: 1.67 mg/dL — ABNORMAL HIGH (ref 0.44–1.00)
GFR calc non Af Amer: 26 mL/min — ABNORMAL LOW (ref >60)
GFR, EST AFRICAN AMERICAN: 30 mL/min — AB (ref >60)
Glucose, Bld: 123 mg/dL — ABNORMAL HIGH (ref 65–99)
Potassium: 4.7 mmol/L (ref 3.5–5.1)
Sodium: 134 mmol/L — ABNORMAL LOW (ref 135–145)

## 2017-05-03 LAB — URINALYSIS, COMPLETE (UACMP) WITH MICROSCOPIC
BILIRUBIN URINE: NEGATIVE
Bacteria, UA: NONE SEEN
GLUCOSE, UA: NEGATIVE mg/dL
KETONES UR: NEGATIVE mg/dL
NITRITE: NEGATIVE
PH: 5 (ref 5.0–8.0)
Protein, ur: 30 mg/dL — AB
Specific Gravity, Urine: 1.016 (ref 1.005–1.030)

## 2017-05-03 LAB — PROTIME-INR
INR: 4.24
Prothrombin Time: 41.9 seconds — ABNORMAL HIGH (ref 11.4–15.2)

## 2017-05-03 LAB — CBC
HCT: 27 % — ABNORMAL LOW (ref 35.0–47.0)
HEMOGLOBIN: 9.2 g/dL — AB (ref 12.0–16.0)
MCH: 32.2 pg (ref 26.0–34.0)
MCHC: 34 g/dL (ref 32.0–36.0)
MCV: 94.9 fL (ref 80.0–100.0)
Platelets: 266 10*3/uL (ref 150–440)
RBC: 2.84 MIL/uL — AB (ref 3.80–5.20)
RDW: 13.3 % (ref 11.5–14.5)
WBC: 12.8 10*3/uL — ABNORMAL HIGH (ref 3.6–11.0)

## 2017-05-03 LAB — MAGNESIUM: Magnesium: 1.8 mg/dL (ref 1.7–2.4)

## 2017-05-03 LAB — HEMOGLOBIN A1C
Hgb A1c MFr Bld: 5.4 % (ref 4.8–5.6)
Mean Plasma Glucose: 108 mg/dL

## 2017-05-03 LAB — ECHOCARDIOGRAM COMPLETE
Height: 68.5 in
Weight: 1988.8 oz

## 2017-05-03 MED ORDER — ENSURE ENLIVE PO LIQD
237.0000 mL | Freq: Three times a day (TID) | ORAL | Status: DC
  Administered 2017-05-03 – 2017-05-17 (×29): 237 mL via ORAL

## 2017-05-03 MED ORDER — MORPHINE SULFATE (PF) 2 MG/ML IV SOLN
2.0000 mg | INTRAVENOUS | Status: DC | PRN
  Administered 2017-05-03 – 2017-05-04 (×3): 2 mg via INTRAVENOUS
  Filled 2017-05-03 (×3): qty 1

## 2017-05-03 MED ORDER — PHYTONADIONE 1 MG/0.5 ML ORAL SOLUTION
1.0000 mg | Freq: Every day | ORAL | Status: DC
  Administered 2017-05-04 – 2017-05-08 (×5): 1 mg via ORAL
  Filled 2017-05-03 (×9): qty 0.5

## 2017-05-03 MED ORDER — METOPROLOL TARTRATE 5 MG/5ML IV SOLN
2.5000 mg | INTRAVENOUS | Status: AC
  Administered 2017-05-03: 2.5 mg via INTRAVENOUS
  Filled 2017-05-03: qty 5

## 2017-05-03 MED ORDER — PNEUMOCOCCAL VAC POLYVALENT 25 MCG/0.5ML IJ INJ: 0.5000 mL | INJECTION | INTRAMUSCULAR | Status: DC | PRN

## 2017-05-03 MED ORDER — PHYTONADIONE 1 MG/0.5 ML ORAL SOLUTION: 1.0000 mg | Freq: Every day | ORAL | Status: DC

## 2017-05-03 MED ORDER — METOPROLOL TARTRATE 25 MG PO TABS
25.0000 mg | ORAL_TABLET | Freq: Four times a day (QID) | ORAL | Status: DC
  Administered 2017-05-04 – 2017-05-05 (×2): 25 mg via ORAL
  Filled 2017-05-03 (×5): qty 1

## 2017-05-03 MED ORDER — HYDROCODONE-ACETAMINOPHEN 5-325 MG PO TABS
1.0000 | ORAL_TABLET | ORAL | Status: DC | PRN
  Administered 2017-05-04 – 2017-05-11 (×17): 1 via ORAL
  Filled 2017-05-03 (×17): qty 1

## 2017-05-03 MED ORDER — AMIODARONE HCL IN DEXTROSE 360-4.14 MG/200ML-% IV SOLN
60.0000 mg/h | INTRAVENOUS | Status: AC
  Administered 2017-05-03: 60 mg/h via INTRAVENOUS
  Filled 2017-05-03: qty 200

## 2017-05-03 MED ORDER — FUROSEMIDE 10 MG/ML IJ SOLN
40.0000 mg | Freq: Every day | INTRAMUSCULAR | Status: DC
  Administered 2017-05-03: 40 mg via INTRAVENOUS
  Filled 2017-05-03: qty 4

## 2017-05-03 NOTE — Progress Notes (Signed)
Says she isn't eating because she is nauseous afterward.  Gave her Zofran 4 mg. Slow IVP as a pre med before breakfast

## 2017-05-03 NOTE — Progress Notes (Signed)
Heart rate running in the 140's to 150's.  Metoprolol 2.5 mg slow IVP.  Heart rate and bp monitored closely.  BP dips low but not unusual for her.  Heart varies from 110's to 140's.

## 2017-05-03 NOTE — Progress Notes (Addendum)
Metoprolol 25 mg given po.  Heart rate declined following the dose.   Unable to give the 1600 dose due to low BP.

## 2017-05-03 NOTE — Progress Notes (Signed)
[employer] [facility] [facility] [place] [facility]   PATIENT NAME: [name]    MR#:  [id number]  DATE OF BIRTH:  07/19/1926  SUBJECTIVE:  CHIEF COMPLAINT:   Chief Complaint  Patient presents with  . Fall  . Cough   - daughter at bedside says that patients cough has improved significantly, patient appears uncomfortable, but denies any pain anywhere, no palpitations - HR is elevated and in 150's now, fever of 100F this AM -Remains on 4.5 L oxygen now  REVIEW OF SYSTEMS:  Review of Systems  Constitutional: Positive for fever and malaise/fatigue. Negative for chills.  HENT: Negative for ear discharge, hearing loss, nosebleeds and sore throat.   Eyes: Negative for blurred vision and double vision.  Respiratory: Negative for cough, shortness of breath and wheezing.   Cardiovascular: Negative for chest pain, palpitations and leg swelling.  Gastrointestinal: Negative for abdominal pain, constipation, diarrhea, nausea and vomiting.  Genitourinary: Negative for dysuria.  Musculoskeletal: Positive for joint pain and myalgias.  Neurological: Negative for dizziness, seizures and headaches.    DRUG ALLERGIES:   Allergies  Allergen Reactions  . Penicillins Itching and Rash    Other reaction(s): RASH  . Enalapril Other (See Comments)    cough  . Statins Other (See Comments)    Other reaction(s): MUSCLE PAIN    VITALS:  Blood pressure 106/78, pulse (!) 158, temperature 100 F (37.8 C), temperature source Oral, resp. rate (!) 24, height 5' 8.5" (1.74 m), weight 56.4 kg (124 lb 4.8 oz), SpO2 94 %.  PHYSICAL EXAMINATION:  Physical Exam  GENERAL:  83 y.o.-year-old patient lying in the bed with no acute distress. Appears uncomfortable in bed. Denies any pain EYES: Pupils equal, round, reactive to light and accommodation. No scleral icterus. Extraocular muscles intact.  HEENT: Head atraumatic, normocephalic. Oropharynx and nasopharynx clear.  NECK:  Supple, no jugular venous  distention. No thyroid enlargement, no tenderness.  LUNGS: Normal breath sounds bilaterally, no wheezing, rales,or crepitation. No use of accessory muscles of respiration. Rhonchi at the Lung bases, left greater than right. CARDIOVASCULAR: S1, S2 normal. No  rubs, or gallops. Loud 4/6 systolic murmur heard ABDOMEN: Soft, nontender, nondistended. Bowel sounds present. No organomegaly or mass.  EXTREMITIES: No pedal edema, cyanosis, or clubbing. Left leg in traction NEUROLOGIC: Cranial nerves II through XII are intact. Muscle strength 5/5 in all extremities except left leg due to pain from fracture. Sensation intact. Gait not checked.  PSYCHIATRIC: The patient is alert and oriented x 3.  SKIN: No obvious rash, lesion, or ulcer.    LABORATORY PANEL:   CBC  Recent Labs Lab 05/03/17 0448  WBC 12.8*  HGB 9.2*  HCT 27.0*  PLT 266   ------------------------------------------------------------------------------------------------------------------  Chemistries   Recent Labs Lab 05/03/17 0448  NA 134*  K 4.7  CL 94*  CO2 29  GLUCOSE 123*  BUN 31*  CREATININE 1.67*  CALCIUM 8.5*  MG 1.8   ------------------------------------------------------------------------------------------------------------------  Cardiac Enzymes  Recent Labs Lab 05/06/2017 2117  TROPONINI 0.16*   ------------------------------------------------------------------------------------------------------------------  RADIOLOGY:  Dg Chest Port 1 View  Result Date: 05/13/2017 CLINICAL DATA:  Fall EXAM: PORTABLE CHEST 1 VIEW COMPARISON:  Chest radiograph 11/24/2012 FINDINGS: Cardiomegaly is worsened from the prior study. There bilateral interstitial opacities, worst in the left lung base. No pneumothorax or sizable pleural effusion. No focal consolidation. IMPRESSION: Cardiomegaly, and calcific aortic atherosclerosis and suspected mild interstitial edema. Electronically Signed   By: Deatra RobinsonKevin  Herman M.D.   On:  05/05/2017 03:56  Dg Hip Unilat W Or Wo Pelvis 2-3 Views Left  Result Date: 04/25/2017 CLINICAL DATA:  Left hip pain after falling EXAM: DG HIP (WITH OR WITHOUT PELVIS) 2-3V LEFT COMPARISON:  None. FINDINGS: There is a transverse fracture of the left femoral neck with associated foreshortening. The femoral head remains approximated to the acetabulum. The right hip is unremarkable. No pelvic diastasis. IMPRESSION: Transverse fracture of the left femoral neck. Electronically Signed   By: Deatra Robinson M.D.   On: 05/10/2017 04:24    EKG:   Orders placed or performed during the hospital encounter of 04/20/2017  . ED EKG  . ED EKG    ASSESSMENT AND PLAN:   83 year old female with multiple medical problems including A. fib on Coumadin, CAD status post CABG, mitral valve clipping for severe MR, bioprosthetic aortic valve replacement surgery, chronic respiratory failure on 2 L home oxygen, Systolic CHF with EF of 45% presents to the hospital secondary to fall and noted to have left hip fracture.  #1 atrial fibrillation with rapid ventricular response-likely triggered by her acute systolic failure. - Continue amiodarone drip.Not started on Cardizem drip due to soft pressures. We'll give 1 dose of IV metoprolol at this time. On oral metoprolol -Appreciate cardiology consult.started on digoxin. -Due to her surgery, Coumadin on hold. -Echocardiogram ordered  #2 acute on chronic systolic CHF- change to IV lasix from oral lasix Check echocardiogram. Metoprolol held due to hypotension. Monitor closely. -Appreciate cardiology consult - repeat CXR today  #3 acute on chronic respiratory failure with hypoxia-secondary to systolic CHF-acute and chronic. -Also COPD exacerbation. -remains on 4.5 L. Continue nebulizers and 1 time dose of solumedrol.  #4 acute renal failure-Baseline creatinine seems to be between 1.2-1.3 -Currently at 1.6. Could be cardiorenal. Monitor, avoid nephrotoxins  #5 left hip  fracture-secondary to mechanical fall. Ortho has been consulted. -Patient not ready for surgery yet. Continue to wait until HR is stable, cardiology clearance and for her heart rate and failure to improve. -Coumadin will be held,INR supratherapeutic. Continue pain medications for pain control.  #6 DVT prophylaxis - will need surgery and INR is supratherapeutic, give 1 more dose of vitamin K today   All the records are reviewed and case discussed with Care Management/Social Workerr. Management plans discussed with the patient, family and they are in agreement.  CODE STATUS: Full code  TOTAL TIME TAKING CARE OF THIS PATIENT: 42 minutes.   POSSIBLE D/C IN 3-4 DAYS, DEPENDING ON CLINICAL CONDITION.   Enid Baas M.D on 05/03/2017 at 8:34 AM  Between 7am to 6pm - Pager - [phone]  After 6pm go to [email or web address] - Social research officer, government  [facility] [place] [facility]  Office  [phone]  CC: Primary care physician; [facility]

## 2017-05-03 NOTE — Progress Notes (Signed)
She had a bad period of pain last night, likely because she wanted to long to ask for pain med.  Because of that she has been asking for pain med when the pain starts to return.  Getting Morphine 2 mg. Slow IVP as needed.

## 2017-05-03 NOTE — Progress Notes (Signed)
ANTICOAGULATION CONSULT NOTE - Initial Consult  Pharmacy Consult for Heparin Drip/Bridging   Indication: atrial fibrillation  Allergies  Allergen Reactions  . Penicillins Itching and Rash    Other reaction(s): RASH  . Enalapril Other (See Comments)    cough  . Statins Other (See Comments)    Other reaction(s): MUSCLE PAIN    Patient Measurements: Height: 5' 8.5" (174 cm) Weight: 124 lb 4.8 oz (56.4 kg) IBW/kg (Calculated) : 65.05 Vital Signs: Temp: 100 F (37.8 C) (07/19 0744) Temp Source: Oral (07/19 0744) BP: 92/68 (07/19 0900) Pulse Rate: 153 (07/19 0900)  Labs:  Recent Labs  10-13-2017 0312 10-13-2017 0954 10-13-2017 1523 10-13-2017 2117 05/03/17 0448  HGB 9.7*  --   --   --  9.2*  HCT 28.8*  --   --   --  27.0*  PLT 316  --   --   --  266  APTT  --  78*  --   --   --   LABPROT  --  36.7*  --   --  41.9*  INR  --  3.59  --   --  4.24*  CREATININE 1.40*  --   --   --  1.67*  TROPONINI 0.03* 0.13* 0.16* 0.16*  --     Estimated Creatinine Clearance: 20.7 mL/min (A) (by C-G formula based on SCr of 1.67 mg/dL (H)).   Medical History: Past Medical History:  Diagnosis Date  . A-fib (HCC)    a. on Coumadin; b. CHADS2VASc => 5 (CHF, age x 2, vascular disease, female)  . Chronic systolic CHF (congestive heart failure) (HCC)   . Coronary artery disease    a. s/p CABG w/ SVG-PRDA  . S/P mitral valve clip implantation   . Status post aortic valve replacement     Assessment: 83 yo female with PMH of A.Fib, admitted with left femoral neck fracture secondary to fall. Patient takes warfarin, which has been placed on hold.  Pharmacy consulted for heparin dosing for bridge therapy for orthopedic surgery.   7/18 INR: 3.59 7/19 INR: 4.24  Goal of Therapy:  INR <2 Heparin level 0.3-0.7 units/ml Monitor platelets by anticoagulation protocol: Yes  Plan:  INR is currently supratheraputic. Once INR is less then 2, will initiate heparin 800u/hr.  INR ordered with AM labs.    Gardner CandleSheema M Jalien Weakland, PharmD, BCPS Clinical Pharmacist 05/03/2017 9:36 AM

## 2017-05-03 NOTE — Plan of Care (Signed)
Problem: Activity: Goal: Ability to tolerate increased activity will improve Outcome: Not Progressing Patient currently on hip precautions and strict bed rest.

## 2017-05-03 NOTE — Consult Note (Signed)
ORTHOPAEDICS PROGRESS NOTE  PATIENT NAME: Cindy Weaver DOB: 06/16/1926  MRN: [id number]  Subjective: Patient rested well last PM. Daughter noticed significant improvement with the patient's coughing. She is tolerating the Buck's traction well.  Objective: Vital signs in last 24 hours: Temp:  [98.7 F (37.1 C)-99.7 F (37.6 C)] 99.6 F (37.6 C) (07/19 0422) Pulse Rate:  [69-106] 69 (07/19 0422) Resp:  [18-20] 20 (07/19 0422) BP: (85-104)/(56-67) 85/64 (07/19 0422) SpO2:  [94 %-98 %] 94 % (07/19 0422)  Intake/Output from previous day: 07/18 0701 - 07/19 0700 In: 225.9 [I.V.:225.9] Out: 800 [Urine:800]   Recent Labs  04/17/2017 0312 05/06/2017 0954 05/03/17 0448  WBC 15.2*  --  12.8*  HGB 9.7*  --  9.2*  HCT 28.8*  --  27.0*  PLT 316  --  266  K 4.0  --  4.7  CL 99*  --  94*  CO2 28  --  29  BUN 25*  --  31*  CREATININE 1.40*  --  1.67*  GLUCOSE 163*  --  123*  CALCIUM 8.9  --  8.5*  INR  --  3.59 4.24*    EXAM General: Well developed, well nourished female in no apparent distress. Lungs: clear to auscultation Cardiac: Irregular rate and rhythm. 3/6 systolic murmur. Left lower extremity: Buck's traction is in place. Neurologic: Awake, alert, and oriented. Sensory and motor function are grossly intact.  Assessment: Displaced left femoral neck fracture  Secondary diagnoses: S/p aortic valve replacement S/p mitral valve clip implantation CAD CHF Atrial fibrillation Anticoagulation with coumadin  Plan: PT/INR is actually higher despite dosing with vitamin K yesterday. The need to have the INR normalized before surgery was discussed with the patient. Heart rate is under better control, but patient is still somewhat hypotensive. Will delay plans for surgical intervention until the patient is medically optimized.  Neville Walston P. Angie FavaHooten, Jr. M.D.

## 2017-05-03 NOTE — Progress Notes (Signed)
Initial Nutrition Assessment  DOCUMENTATION CODES:   Non-severe (moderate) malnutrition in context of chronic illness  INTERVENTION:   Ensure Enlive po BID, each supplement provides 350 kcal and 20 grams of protein  Magic cup TID with meals, each supplement provides 290 kcal and 9 grams of protein  MVI  Snacks  Liberalize diet   NUTRITION DIAGNOSIS:   Malnutrition (moderate) related to  (heart disease and advanced age ) as evidenced by severe depletion of muscle mass, 5 percent weight loss in 1 month.  GOAL:   Patient will meet greater than or equal to 90% of their needs  MONITOR:   PO intake, Supplement acceptance, Labs, Weight trends  REASON FOR ASSESSMENT:   Consult Assessment of nutrition requirement/status  ASSESSMENT:   83 year old female with multiple medical problems including A. fib on Coumadin, CAD status post CABG, mitral valve clipping for severe MR, bioprosthetic aortic valve replacement surgery, chronic respiratory failure on 2 L home oxygen, Systolic CHF with EF of 16% presents to the hospital secondary to fall and noted to have left hip fracture.   Met with pt and family in room today. Pt reports poor appetite and oral intake for the past month but family reports that her appetite has been declining over the past year. Per chart, pt has lost 6lbs(5%) in 1 month; this is significant given history and time frame. Pt eating 25% of meals today but family reports that pt did drink an Ensure. RD discussed the importance of adequate protein intake needed for healing and to preserve lean muscle mass. Pt likes Ensure; RD will order. Pt also would like to have snacks. RD will also liberalize pt's diet to enocurage po intake.    Medications reviewed and include: colace, ferrous sulfate, lasix, MVI, protonix, KCl, Vit K, morphine, zofran  Labs reviewed: Na 134(L), Cl 94(L), BUN 31(H), creat 1.67(H), Ca 8.5(L) Wbc- 12.8(H), Hgb 9.2(L), Hct 27(H)  Nutrition-Focused  physical exam completed. Findings are no fat depletion, severe muscle depletions over entire body, and no edema.   Diet Order:  Diet Heart Room service appropriate? Yes; Fluid consistency: Thin  Skin:  Reviewed, no issues  Last BM:  7/17  Height:   Ht Readings from Last 1 Encounters:  05/14/2017 5' 8.5" (1.74 m)    Weight:   Wt Readings from Last 1 Encounters:  05/08/2017 124 lb 4.8 oz (56.4 kg)    Ideal Body Weight:  64.7 kg  BMI:  Body mass index is 18.62 kg/m.  Estimated Nutritional Needs:   Kcal:  1600-1800kcal/day   Protein:  73-84g/day   Fluid:  >1.6L/day   EDUCATION NEEDS:   Education needs addressed  Koleen Distance MS, RD, LDN Pager #[phone] After Hours Pager: [phone]

## 2017-05-03 NOTE — Progress Notes (Signed)
Progress Note  Patient Name: Cindy Weaver Date of Encounter: 05/03/2017  Primary Cardiologist: at [facility]  Subjective   She still reports hip pain. She continues to be in atrial fibrillation with tachycardia. She seems to be having shortness of breath progressed.  Inpatient Medications    Scheduled Meds: . docusate sodium  100 mg Oral BID  . ferrous sulfate  325 mg Oral Q breakfast  . fluticasone  1 spray Each Nare Daily  . furosemide  40 mg Intravenous Daily  . metoprolol tartrate  25 mg Oral Q6H  . mometasone-formoterol  2 puff Inhalation BID  . multivitamin with minerals  1 tablet Oral Daily  . pantoprazole  40 mg Oral Daily  . phytonadione  0.2 mg Oral Daily  . pneumococcal 23 valent vaccine  0.5 mL Intramuscular Tomorrow-1000  . potassium chloride  10 mEq Oral Daily   Continuous Infusions: . amiodarone 60 mg/hr (05/03/17 1034)   PRN Meds: acetaminophen **OR** acetaminophen, morphine injection, ondansetron **OR** ondansetron (ZOFRAN) IV   Vital Signs    Vitals:   05/03/17 0930 05/03/17 0945 05/03/17 1000 05/03/17 1015  BP: 114/60 (!) 99/57 110/61 102/66  Pulse: (!) 131 (!) 118 (!) 144 (!) 110  Resp:      Temp:      TempSrc:      SpO2:      Weight:      Height:        Intake/Output Summary (Last 24 hours) at 05/03/17 1203 Last data filed at 05/03/17 1148  Gross per 24 hour  Intake           225.89 ml  Output             1750 ml  Net         -1524.11 ml   Filed Weights   04/25/2017 0306 05/01/2017 0606  Weight: 124 lb (56.2 kg) 124 lb 4.8 oz (56.4 kg)    Telemetry    Atrial fibrillation with rapid ventricular response. Heart rate currently is 123 - Personally Reviewed  ECG    Not done today.  Physical Exam   GEN: No acute distress.   Neck: Mild JVD Cardiac: Irregularly irregular and tachycardic, no murmurs, rubs, or gallops.  Respiratory: Clear to auscultation bilaterally. GI: Soft, nontender, non-distended  MS: No edema; No deformity. Neuro:   Nonfocal  Psych: Normal affect   Labs    Chemistry Recent Labs Lab 04/26/2017 0312 05/03/17 0448  NA 137 134*  K 4.0 4.7  CL 99* 94*  CO2 28 29  GLUCOSE 163* 123*  BUN 25* 31*  CREATININE 1.40* 1.67*  CALCIUM 8.9 8.5*  GFRNONAA 32* 26*  GFRAA 38* 30*  ANIONGAP 10 11     Hematology Recent Labs Lab 04/29/2017 0312 05/03/17 0448  WBC 15.2* 12.8*  RBC 2.96* 2.84*  HGB 9.7* 9.2*  HCT 28.8* 27.0*  MCV 97.6 94.9  MCH 32.9 32.2  MCHC 33.7 34.0  RDW 13.2 13.3  PLT 316 266    Cardiac Enzymes Recent Labs Lab 04/17/2017 0312 04/27/2017 0954 04/18/2017 1523 04/22/2017 2117  TROPONINI 0.03* 0.13* 0.16* 0.16*   No results for input(s): TROPIPOC in the last 168 hours.   BNP Recent Labs Lab 05/04/2017 0312  BNP 485.0*     DDimer No results for input(s): DDIMER in the last 168 hours.   Radiology    Dg Chest Port 1 View  Result Date: 05/05/2017 CLINICAL DATA:  Fall EXAM: PORTABLE CHEST 1 VIEW COMPARISON:  Chest radiograph 11/24/2012 FINDINGS: Cardiomegaly is worsened from the prior study. There bilateral interstitial opacities, worst in the left lung base. No pneumothorax or sizable pleural effusion. No focal consolidation. IMPRESSION: Cardiomegaly, and calcific aortic atherosclerosis and suspected mild interstitial edema. Electronically Signed   By: Deatra RobinsonKevin  Herman M.D.   On: 04/16/2017 03:56   Dg Hip Unilat W Or Wo Pelvis 2-3 Views Left  Result Date: 05/04/2017 CLINICAL DATA:  Left hip pain after falling EXAM: DG HIP (WITH OR WITHOUT PELVIS) 2-3V LEFT COMPARISON:  None. FINDINGS: There is a transverse fracture of the left femoral neck with associated foreshortening. The femoral head remains approximated to the acetabulum. The right hip is unremarkable. No pelvic diastasis. IMPRESSION: Transverse fracture of the left femoral neck. Electronically Signed   By: Deatra RobinsonKevin  Herman M.D.   On: 04/20/2017 04:24    Cardiac Studies   Echocardiogram was personally reviewed which showed an EF of  35-40%, normal functioning prosthetic aortic valve and mitral valve cleft with moderate regurgitation and mild stenosis and mild/moderate pulmonary hypertension.  Patient Profile     83 y.o. female with past medical history of coronary artery disease status post CABG, valvular heart disease status post aortic valve replacement with bioprosthetic valve and mitral valve clinic for severe regurgitation, chronic atrial fibrillation and multiple other chronic medical conditions who presented after a fall and was found to have hip fracture.  Assessment & Plan    1. Chronic atrial fibrillation now with rapid ventricular response in the setting of hip fracture and increased pain. She has been placed on amiodarone drip for rate control given that her blood pressure has been on the low side. Digoxin is not a good long-term option given underlying kidney disease but we might have to use sporadic doses as a last resort. I elected to continue amiodarone for now. I switched metoprolol to 25 mg every 6 hours to see if she can tolerate this. She has holding parameters to hold for systolic blood pressure less than 100.  warfarin is on hold and preparation for surgery.  2. Acute on chronic systolic heart failure: Echo showed worsening ejection fraction of 35-40%. Continue diuresis as needed. I suspect that the drop in the EF might be related to A. fib with RVR.  3. Elevated troponin: Likely demand ischemia  4. Preoperative cardiovascular evaluation before hip surgery: Given multiple cardiac issues, the patient is at least at moderate risk for surgery. We have to try to control her atrial fibrillation better before proceeding.   Signed, Lorine BearsMuhammad Arida, MD  05/03/2017, 12:03 PM

## 2017-05-04 DIAGNOSIS — I5023 Acute on chronic systolic (congestive) heart failure: Secondary | ICD-10-CM

## 2017-05-04 DIAGNOSIS — J9621 Acute and chronic respiratory failure with hypoxia: Secondary | ICD-10-CM

## 2017-05-04 DIAGNOSIS — E440 Moderate protein-calorie malnutrition: Secondary | ICD-10-CM | POA: Insufficient documentation

## 2017-05-04 LAB — CBC
HEMATOCRIT: 28.3 % — AB (ref 35.0–47.0)
HEMOGLOBIN: 9.4 g/dL — AB (ref 12.0–16.0)
MCH: 31.7 pg (ref 26.0–34.0)
MCHC: 33.2 g/dL (ref 32.0–36.0)
MCV: 95.3 fL (ref 80.0–100.0)
Platelets: 257 10*3/uL (ref 150–440)
RBC: 2.97 MIL/uL — ABNORMAL LOW (ref 3.80–5.20)
RDW: 13.8 % (ref 11.5–14.5)
WBC: 14.6 10*3/uL — AB (ref 3.6–11.0)

## 2017-05-04 LAB — MRSA PCR SCREENING: MRSA by PCR: NEGATIVE

## 2017-05-04 LAB — BASIC METABOLIC PANEL
ANION GAP: 11 (ref 5–15)
BUN: 44 mg/dL — AB (ref 6–20)
CO2: 30 mmol/L (ref 22–32)
Calcium: 8.8 mg/dL — ABNORMAL LOW (ref 8.9–10.3)
Chloride: 93 mmol/L — ABNORMAL LOW (ref 101–111)
Creatinine, Ser: 2.24 mg/dL — ABNORMAL HIGH (ref 0.44–1.00)
GFR calc Af Amer: 21 mL/min — ABNORMAL LOW (ref >60)
GFR, EST NON AFRICAN AMERICAN: 18 mL/min — AB (ref >60)
Glucose, Bld: 117 mg/dL — ABNORMAL HIGH (ref 65–99)
POTASSIUM: 4.8 mmol/L (ref 3.5–5.1)
SODIUM: 134 mmol/L — AB (ref 135–145)

## 2017-05-04 LAB — PROTIME-INR
INR: 3.29
PROTHROMBIN TIME: 34.2 s — AB (ref 11.4–15.2)

## 2017-05-04 LAB — PROCALCITONIN: Procalcitonin: 3.78 ng/mL

## 2017-05-04 MED ORDER — TIOTROPIUM BROMIDE MONOHYDRATE 18 MCG IN CAPS
18.0000 ug | ORAL_CAPSULE | Freq: Every day | RESPIRATORY_TRACT | Status: DC
  Filled 2017-05-04: qty 5

## 2017-05-04 MED ORDER — PREDNISONE 20 MG PO TABS
20.0000 mg | ORAL_TABLET | Freq: Every day | ORAL | Status: DC
  Administered 2017-05-05 – 2017-05-16 (×11): 20 mg via ORAL
  Filled 2017-05-04 (×11): qty 1

## 2017-05-04 MED ORDER — AMIODARONE HCL IN DEXTROSE 360-4.14 MG/200ML-% IV SOLN
60.0000 mg/h | INTRAVENOUS | Status: DC
  Administered 2017-05-04: 30 mg/h via INTRAVENOUS
  Administered 2017-05-04 – 2017-05-08 (×14): 60 mg/h via INTRAVENOUS
  Filled 2017-05-04 (×14): qty 200

## 2017-05-04 MED ORDER — AMIODARONE HCL IN DEXTROSE 360-4.14 MG/200ML-% IV SOLN: 30.0000 mg/h | INTRAVENOUS | Status: DC

## 2017-05-04 MED ORDER — IPRATROPIUM-ALBUTEROL 0.5-2.5 (3) MG/3ML IN SOLN: 3.0000 mL | RESPIRATORY_TRACT | Status: DC

## 2017-05-04 MED ORDER — ALBUTEROL SULFATE (2.5 MG/3ML) 0.083% IN NEBU
2.5000 mg | INHALATION_SOLUTION | RESPIRATORY_TRACT | Status: DC
  Administered 2017-05-04 – 2017-05-06 (×11): 2.5 mg via RESPIRATORY_TRACT
  Filled 2017-05-04 (×11): qty 3

## 2017-05-04 MED ORDER — TIOTROPIUM BROMIDE MONOHYDRATE 18 MCG IN CAPS
18.0000 ug | ORAL_CAPSULE | Freq: Every day | RESPIRATORY_TRACT | Status: DC
  Administered 2017-05-05 – 2017-05-16 (×11): 18 ug via RESPIRATORY_TRACT
  Filled 2017-05-04 (×4): qty 5

## 2017-05-04 MED ORDER — SODIUM CHLORIDE 0.9 % IV BOLUS (SEPSIS)
250.0000 mL | Freq: Once | INTRAVENOUS | Status: AC
  Administered 2017-05-04: 250 mL via INTRAVENOUS

## 2017-05-04 MED ORDER — SODIUM CHLORIDE 0.9 % IV SOLN
INTRAVENOUS | Status: AC
  Administered 2017-05-04 – 2017-05-05 (×2): via INTRAVENOUS

## 2017-05-04 MED ORDER — BUDESONIDE 0.5 MG/2ML IN SUSP
0.5000 mg | Freq: Two times a day (BID) | RESPIRATORY_TRACT | Status: DC
  Administered 2017-05-04 – 2017-05-18 (×28): 0.5 mg via RESPIRATORY_TRACT
  Filled 2017-05-04 (×31): qty 2

## 2017-05-04 MED ORDER — DEXTROSE 5 % IV SOLN
1.0000 g | INTRAVENOUS | Status: DC
  Administered 2017-05-04 – 2017-05-07 (×4): 1 g via INTRAVENOUS
  Filled 2017-05-04 (×5): qty 1

## 2017-05-04 NOTE — Progress Notes (Signed)
Pt complains of nausea with emesis. Zofran given. I will continue to assess.

## 2017-05-04 NOTE — Progress Notes (Signed)
ANTICOAGULATION CONSULT NOTE - Initial Consult  Pharmacy Consult for Heparin Drip/Bridging   Indication: atrial fibrillation  Allergies  Allergen Reactions  . Penicillins Itching and Rash    Other reaction(s): RASH  . Enalapril Other (See Comments)    cough  . Statins Other (See Comments)    Other reaction(s): MUSCLE PAIN    Patient Measurements: Height: 5' 8.5" (174 cm) Weight: 133 lb 6.4 oz (60.5 kg) IBW/kg (Calculated) : 65.05 Vital Signs: Temp: 98.9 F (37.2 C) (07/20 0429) Temp Source: Oral (07/20 0429) BP: 89/52 (07/20 0429) Pulse Rate: 107 (07/20 0429)  Labs:  Recent Labs  Feb 02, 2017 0312 Feb 02, 2017 0954 Feb 02, 2017 1523 Feb 02, 2017 2117 05/03/17 0448 05/04/17 0600  HGB 9.7*  --   --   --  9.2* 9.4*  HCT 28.8*  --   --   --  27.0* 28.3*  PLT 316  --   --   --  266 257  APTT  --  78*  --   --   --   --   LABPROT  --  36.7*  --   --  41.9* 34.2*  INR  --  3.59  --   --  4.24* 3.29  CREATININE 1.40*  --   --   --  1.67* 2.24*  TROPONINI 0.03* 0.13* 0.16* 0.16*  --   --     Estimated Creatinine Clearance: 16.6 mL/min (A) (by C-G formula based on SCr of 2.24 mg/dL (H)).   Medical History: Past Medical History:  Diagnosis Date  . A-fib (HCC)    a. on Coumadin; b. CHADS2VASc => 5 (CHF, age x 2, vascular disease, female)  . Chronic systolic CHF (congestive heart failure) (HCC)   . Coronary artery disease    a. s/p CABG w/ SVG-PRDA  . S/P mitral valve clip implantation   . Status post aortic valve replacement     Assessment: 83 yo female with PMH of A.Fib, admitted with left femoral neck fracture secondary to fall. Patient takes warfarin, which has been placed on hold.  Pharmacy consulted for heparin dosing for bridge therapy for orthopedic surgery.   7/18 INR: 3.59 7/19 INR: 4.24 7/20 INR 3.29  Goal of Therapy:  INR <2 Heparin level 0.3-0.7 units/ml Monitor platelets by anticoagulation protocol: Yes  Plan:  INR is currently supratheraputic. Once INR is  less then 2, will initiate heparin 800u/hr.  INR ordered with AM labs.    Luan PullingGarrett Nocholas Damaso, PharmD, BCPS Clinical Pharmacist 05/04/2017 7:40 AM

## 2017-05-04 NOTE — Progress Notes (Signed)
Pts BP is 73/45, HR 128 and pt remains asymptomatic. MD notified. Orders for bolus. I will continue to assess.

## 2017-05-04 NOTE — Progress Notes (Signed)
[facility] - [place] [facility]   PATIENT NAME: [name]    MR#:  [id number]  DATE OF BIRTH:  10/17/1925  SUBJECTIVE:  CHIEF COMPLAINT:   Chief Complaint  Patient presents with  . Fall  . Cough   - patient appears better today. No fevers. Pain better controlled. Requiring less amounts of oxygen today. -Heart rate is still difficult to be controlled.INR is therapeutic while off of warfarin  REVIEW OF SYSTEMS:  Review of Systems  Constitutional: Positive for malaise/fatigue. Negative for chills and fever.  HENT: Negative for ear discharge, hearing loss, nosebleeds and sore throat.   Eyes: Negative for blurred vision and double vision.  Respiratory: Negative for cough, shortness of breath and wheezing.   Cardiovascular: Negative for chest pain, palpitations and leg swelling.  Gastrointestinal: Negative for abdominal pain, constipation, diarrhea, nausea and vomiting.  Genitourinary: Negative for dysuria.  Musculoskeletal: Positive for joint pain and myalgias.  Neurological: Negative for dizziness, seizures and headaches.    DRUG ALLERGIES:   Allergies  Allergen Reactions  . Penicillins Itching and Rash    Other reaction(s): RASH  . Enalapril Other (See Comments)    cough  . Statins Other (See Comments)    Other reaction(s): MUSCLE PAIN    VITALS:  Blood pressure (!) 108/52, pulse (!) 119, temperature 98.4 F (36.9 C), temperature source Oral, resp. rate 18, height 5' 8.5" (1.74 m), weight 60.5 kg (133 lb 6.4 oz), SpO2 93 %.  PHYSICAL EXAMINATION:  Physical Exam  GENERAL:  83 y.o.-year-old patient lying in the bed with no acute distress. Appears uncomfortable in bed. Denies any pain EYES: Pupils equal, round, reactive to light and accommodation. No scleral icterus. Extraocular muscles intact.  HEENT: Head atraumatic, normocephalic. Oropharynx and nasopharynx clear.  NECK:  Supple, no jugular venous distention. No thyroid enlargement, no  tenderness.  LUNGS: Normal breath sounds bilaterally, no wheezing, rales,or crepitation. No use of accessory muscles of respiration. Rhonchi at the Lung bases, left greater than right. CARDIOVASCULAR: S1, S2 normal. No  rubs, or gallops. Loud 4/6 systolic murmur heard ABDOMEN: Soft, nontender, nondistended. Bowel sounds present. No organomegaly or mass.  EXTREMITIES: No pedal edema, cyanosis, or clubbing. Left leg in traction NEUROLOGIC: Cranial nerves II through XII are intact. Muscle strength 5/5 in all extremities except left leg due to pain from fracture. Sensation intact. Gait not checked.  PSYCHIATRIC: The patient is alert and oriented x 3.  SKIN: No obvious rash, lesion, or ulcer.    LABORATORY PANEL:   CBC  Recent Labs Lab 05/04/17 0600  WBC 14.6*  HGB 9.4*  HCT 28.3*  PLT 257   ------------------------------------------------------------------------------------------------------------------  Chemistries   Recent Labs Lab 05/03/17 0448 05/04/17 0600  NA 134* 134*  K 4.7 4.8  CL 94* 93*  CO2 29 30  GLUCOSE 123* 117*  BUN 31* 44*  CREATININE 1.67* 2.24*  CALCIUM 8.5* 8.8*  MG 1.8  --    ------------------------------------------------------------------------------------------------------------------  Cardiac Enzymes  Recent Labs Lab 05/12/2017 2117  TROPONINI 0.16*   ------------------------------------------------------------------------------------------------------------------  RADIOLOGY:  Dg Chest Port 1 View  Result Date: 05/03/2017 CLINICAL DATA:  83 y/o female with acute onset shortness of breath today. Hypoxia. EXAM: PORTABLE CHEST 1 VIEW COMPARISON:  04/29/2017 and earlier. FINDINGS: Portable AP upright view at 0801 hours. Stable large lung volumes. Stable cardiomegaly and mediastinal contours. Calcified aortic atherosclerosis. Mildly increased bilateral pulmonary interstitial opacity, which appears to be acute on chronic. No pneumothorax. No  definite pleural effusion.  No consolidation. Negative visible bowel gas pattern. Prior sternotomy. IMPRESSION: 1. Acute on chronic bilateral pulmonary interstitial opacity has mildly increased since yesterday. Consider acute viral/atypical respiratory infection and interstitial edema. No definite pleural effusion. 2. Underlying pulmonary hyperinflation, cardiomegaly, Calcified aortic atherosclerosis. Electronically Signed   By: Odessa Fleming M.D.   On: 05/03/2017 08:52    EKG:   Orders placed or performed during the hospital encounter of 05/14/2017  . ED EKG  . ED EKG    ASSESSMENT AND PLAN:   83 year old female with multiple medical problems including A. fib on Coumadin, CAD status post CABG, mitral valve clipping for severe MR, bioprosthetic aortic valve replacement surgery, chronic respiratory failure on 2 L home oxygen, Systolic CHF with EF of 45% presents to the hospital secondary to fall and noted to have left hip fracture.  #1 atrial fibrillation with rapid ventricular response-likely triggered by her acute systolic failure and hypoxia - Continue amiodarone drip.started on oral metoprolol. -Echocardiogram with EF of 35-40%. -Appreciate cardiology consult.started on digoxin. -Due to her surgery, Coumadin on hold.  -if still suboptimal heart rate control is noted, is to be a candidate for cardioversion.INR is therapeutic at this time.  #2 acute on chronic systolic CHF- received IV Lasix on admission. -remaining net negative of almost 2 L since admission -Due to worsening renal failure, will hold Lasix today. -Appreciate cardiology consult  #3 acute on chronic respiratory failure with hypoxia-secondary to systolic CHF-acute and chronic. -also significant pneumonitis noted on chest x-ray. MRSA PCR is pending. We'll start cefepime. -Continue to wean oxygen as tolerated. At home she is only on 2 L.. - requested pulmonary consult as well- since she will need surgery -Continue nebulizers.  #4  acute renal failure-Baseline creatinine seems to be between 1.2-1.3 -Currently at 2.2.. avoid nephrotoxins - ATn from hypotension and also diuresis, gentle hydration today  #5 left hip fracture-secondary to mechanical fall. Ortho has been consulted. -Patient not ready for surgery yet. Continue to wait until HR is stable, cardiology clearance needed  -Coumadin will be held,INR supratherapeutic. Continue pain medications for pain control.  #6 DVT prophylaxis - will need surgery and INR is supratherapeutic, on daily  vitamin K   All the records are reviewed and case discussed with Care Management/Social Workerr. Management plans discussed with the patient, family and they are in agreement.  CODE STATUS: Full code  TOTAL TIME TAKING CARE OF THIS PATIENT: .   POSSIBLE D/C IN 3-4 DAYS, DEPENDING ON CLINICAL CONDITION.   Hillman Attig M.D on 05/04/2017 at 9:39 AM  Between 7am to 6pm - Pager - [phone]  After 6pm go to [email or web address] - Social research officer, government  [facility]  Office  [phone]  CC: Primary care physician; [facility]

## 2017-05-04 NOTE — Care Management (Signed)
Patient awaiting surgical intervention for hip fracture but heart rate is still unstable.  INR is decreasing and coumadin continued on hold

## 2017-05-04 NOTE — Plan of Care (Signed)
Problem: Pain Managment: Goal: General experience of comfort will improve Outcome: Progressing Pt is given prn pain medication as ordered. Pt reports a decrease in discomfort.

## 2017-05-04 NOTE — Consult Note (Signed)
ORTHOPAEDICS PROGRESS NOTE  PATIENT NAME: Cindy Weaver DOB: 09/01/1926  MRN: [id number]  Subjective: No new complaints. She believes there breathing is better. She denies any chest pain.  Objective: Vital signs in last 24 hours: Temp:  [98.9 F (37.2 C)-99.3 F (37.4 C)] 98.9 F (37.2 C) (07/20 0429) Pulse Rate:  [104-158] 107 (07/20 0429) Resp:  [18-24] 18 (07/20 0429) BP: (80-118)/(48-94) 89/52 (07/20 0429) SpO2:  [90 %-91 %] 90 % (07/20 0429) Weight:  [60.5 kg (133 lb 6.4 oz)] 60.5 kg (133 lb 6.4 oz) (07/20 0429)  Intake/Output from previous day: 07/19 0701 - 07/20 0700 In: 0  Out: 1300 [Urine:1300]   Recent Labs  05/10/2017 0312  05/03/17 0448 05/04/17 0600  WBC 15.2*  --  12.8* 14.6*  HGB 9.7*  --  9.2* 9.4*  HCT 28.8*  --  27.0* 28.3*  PLT 316  --  266 257  K 4.0  --  4.7 4.8  CL 99*  --  94* 93*  CO2 28  --  29 30  BUN 25*  --  31* 44*  CREATININE 1.40*  --  1.67* 2.24*  GLUCOSE 163*  --  123* 117*  CALCIUM 8.9  --  8.5* 8.8*  INR  --   < > 4.24* 3.29  < > = values in this interval not displayed.  EXAM [place] traction in place. Skin intact.  Assessment: Left femoral neck fracture  Plan: Still with bouts of rapid heart rate and hypotension. PT/INR still elevate (slightly improved). Optimization still underway with Medicine and Cardiology before I will schedule surgery. I will ask Anesthesiology to discuss her case with Cardiology for appropriate anesthesia planning.  James P. Angie FavaHooten, Jr. M.D.

## 2017-05-04 NOTE — Progress Notes (Signed)
Patient Name: Cindy Weaver Date of Encounter: 05/04/2017  Primary Cardiologist: [facility] Problem List     Principal Problem:   Hip fracture [facility]) Active Problems:   Atrial fibrillation with RVR (HCC)   Acute on chronic systolic CHF (congestive heart failure) (HCC)   Acute on chronic respiratory failure with hypoxia (HCC)   Anemia   Status post aortic valve replacement   Status post mitral valve repair   AKI (acute kidney injury) (HCC)   History of Coumadin therapy   Elevated troponin   Hypotension   Malnutrition of moderate degree     Subjective   She remains in Afib with RVR with ventricular rates in the 120s to 130s bpm. Renal function has continued to increase this morning to a SCr of 2.24 from 1.67 the day prior. Lasix held this morning. BP soft in the upper 80s systolic. INR 3.29, improved from 4.24 the day prior.   Inpatient Medications    Scheduled Meds: . docusate sodium  100 mg Oral BID  . feeding supplement (ENSURE ENLIVE)  237 mL Oral TID BM  . ferrous sulfate  325 mg Oral Q breakfast  . fluticasone  1 spray Each Nare Daily  . metoprolol tartrate  25 mg Oral Q6H  . mometasone-formoterol  2 puff Inhalation BID  . multivitamin with minerals  1 tablet Oral Daily  . pantoprazole  40 mg Oral Daily  . phytonadione  1 mg Oral Daily  . pneumococcal 23 valent vaccine  0.5 mL Intramuscular Tomorrow-1000  . potassium chloride  10 mEq Oral Daily   Continuous Infusions: . sodium chloride    . amiodarone 30 mg/hr (05/04/17 0117)  . ceFEPime (MAXIPIME) IV     PRN Meds: acetaminophen **OR** acetaminophen, HYDROcodone-acetaminophen, morphine injection, ondansetron **OR** ondansetron (ZOFRAN) IV, pneumococcal 23 valent vaccine   Vital Signs    Vitals:   05/03/17 1633 05/03/17 2042 05/04/17 0330 05/04/17 0429  BP: (!) 85/53 91/62 (!) 80/48 (!) 89/52  Pulse: (!) 114 (!) 104 (!) 105 (!) 107  Resp:  18  18  Temp:  98.9 F (37.2 C)  98.9 F (37.2 C)    TempSrc:  Oral  Oral  SpO2:  90%  90%  Weight:    133 lb 6.4 oz (60.5 kg)  Height:        Intake/Output Summary (Last 24 hours) at 05/04/17 0747 Last data filed at 05/04/17 0433  Gross per 24 hour  Intake                0 ml  Output             1300 ml  Net            -1300 ml   Filed Weights   2017/05/08 0306 05-08-17 0606 05/04/17 0429  Weight: 124 lb (56.2 kg) 124 lb 4.8 oz (56.4 kg) 133 lb 6.4 oz (60.5 kg)    Physical Exam    GEN: Well nourished, well developed, in no acute distress.  HEENT: Grossly normal.  Neck: Supple, no JVD, carotid bruits, or masses. Cardiac: Tachycardic, irregularly irregular, no murmurs, rubs, or gallops. No clubbing, cyanosis, edema.  Radials/DP/PT 2+ and equal bilaterally.  Respiratory:  Respirations regular and unlabored, clear to auscultation bilaterally. GI: Soft, nontender, nondistended, BS + x 4. MS: no deformity or atrophy. Skin: warm and dry, no rash. Neuro:  Strength and sensation are intact. Psych: AAOx3.  Normal affect.  Labs    CBC  Recent Labs  05/03/17 0448 05/04/17 0600  WBC 12.8* 14.6*  HGB 9.2* 9.4*  HCT 27.0* 28.3*  MCV 94.9 95.3  PLT 266 257   Basic Metabolic Panel  Recent Labs  27-May-2017 0954 05/03/17 0448 05/04/17 0600  NA  --  134* 134*  K  --  4.7 4.8  CL  --  94* 93*  CO2  --  29 30  GLUCOSE  --  123* 117*  BUN  --  31* 44*  CREATININE  --  1.67* 2.24*  CALCIUM  --  8.5* 8.8*  MG 1.7 1.8  --    Liver Function Tests No results for input(s): AST, ALT, ALKPHOS, BILITOT, PROT, ALBUMIN in the last 72 hours. No results for input(s): LIPASE, AMYLASE in the last 72 hours. Cardiac Enzymes  Recent Labs  2017-05-27 0954 05/27/17 1523 05-27-2017 2117  TROPONINI 0.13* 0.16* 0.16*   BNP Invalid input(s): POCBNP D-Dimer No results for input(s): DDIMER in the last 72 hours. Hemoglobin A1C  Recent Labs  05/27/2017 0954  HGBA1C 5.4   Fasting Lipid Panel No results for input(s): CHOL, HDL, LDLCALC, TRIG,  CHOLHDL, LDLDIRECT in the last 72 hours. Thyroid Function Tests  Recent Labs  05-27-17 0954  TSH 4.718*    Telemetry    Afib with RVR, 120s to 130s bpm - Personally Reviewed  ECG    n/a - Personally Reviewed  Radiology    Dg Chest Port 1 View  Result Date: 05/03/2017 IMPRESSION: 1. Acute on chronic bilateral pulmonary interstitial opacity has mildly increased since yesterday. Consider acute viral/atypical respiratory infection and interstitial edema. No definite pleural effusion. 2. Underlying pulmonary hyperinflation, cardiomegaly, Calcified aortic atherosclerosis. Electronically Signed   By: Odessa Fleming M.D.   On: 05/03/2017 08:52    Cardiac Studies   TTE 2017-05-27: Study Conclusions  - Left ventricle: The cavity size was normal. There was mild   concentric hypertrophy. Systolic function was moderately reduced.   The estimated ejection fraction was in the range of 35% to 40%.   The study was not technically sufficient to allow evaluation of   LV diastolic dysfunction due to atrial fibrillation. - Aortic valve: A bioprosthesis was present and functioning   normally. - Mitral valve: A mitral valve clip was present. The findings are   consistent with mild stenosis. There was moderate regurgitation.   Mean gradient (D): 6 mm Hg. - Left atrium: The atrium was moderately to severely dilated. - Right atrium: The atrium was mildly dilated. - Tricuspid valve: There was moderate regurgitation. - Pulmonic valve: There was moderate regurgitation. - Pulmonary arteries: Systolic pressure was mildly to moderately   increased. PA peak pressure: 48 mm Hg (S).  Patient Profile     83 y.o. female with history of coronary artery disease status post CABG, valvular heart disease status post aortic valve replacement with bioprosthetic valve and mitral valve clinic for severe regurgitation, chronic atrial fibrillation and multiple other chronic medical conditions who presented after a fall and  was found to have hip fracture.  Assessment & Plan    1. Chronic Afib with RVR: -Rates remain difficult to control with ventricular rates in the 120s to 130s this morning -BP soft which does not allow for titration of metoprolol  -Not an ideal candidate for CCB given her cardiomyopathy and soft BP -Not an ideal candidate for digoxin given her AKI with SCr 2. -Increase IV amiodarone back to full-dose for rate control -May need attempt at TEE/DCCV given suboptimal rate  control -INR therapeutic, Coumadin on hold for pending hip surgery  2. Acute on chronic systolic CHF: -Echo showed worsening ejection fraction of 35-40% -Cardiomyopathy likely 2/2 Afib with RVR -Will need adequate rate control followed by repeat echo in ~ 1 month -Lasix on hold given AKI -Metoprolol as above  3. Elevated troponin: -Likely supply demand ischemia  4. Preoperative cardiac evaluation: -Given multiple cardiac issues, the patient is at least at moderate risk for surgery. We have to try to control her atrial fibrillation better before proceeding  5. AKI: -Lasix on hold this morning -May benefit from some IV fluids, this may help some with her heart rate as well  6. Anemia: -HGB remains low, though stable  7. Hip fracture: -Pain may be playing a role in her difficult to control ventricular rates  Signed, Eula Listenyan Mehkai Gallo, PA-C [facility] Pager: [phone] 05/04/2017, 7:47 AM

## 2017-05-04 NOTE — Consult Note (Signed)
Name: Cindy Weaver MRN: [id number] DOB: April 21, 1926    ADMISSION DATE:  05/03/2017 CONSULTATION DATE:  05/04/2017  REFERRING MD :  Dr. Nemiah Commander  CHIEF COMPLAINT:  Acute Hypoxic Respiratory Failure  BRIEF PATIENT DESCRIPTION:  83 y.o. Female admitted s/p fall with Left Hip Fracture, A-fib RVR, CHF exacerbation, and AKI.  SIGNIFICANT EVENTS  05/06/2017>> Admission to [facility] Telemetry unit s/p Fall and Left hip fracture 05/04/17>> PCCM consulted for Acute Hypoxic Respiratory Failure  STUDIES:  CXR 7/20>> . Acute on chronic bilateral pulmonary interstitial opacity has mildly increased since yesterday. Consider acute viral/atypical respiratory infection and interstitial edema. No definite pleural Effusion.  Underlying pulmonary hyperinflation, cardiomegaly, Calcified aortic atherosclerosis.   HISTORY OF PRESENT ILLNESS:   Cindy Weaver is a 83 y.o. Female with a PMH of Chronic Systolic CHF, A-fib, CAD, s/p aortic valve replacement, and s/p mitral valve clip implantation.  She [facility] on 7/18 status post fall.  In the ED her Left hip was noted to be fractured, she was in Atrial fibrillation with RVR, and CXR was concerning for pulmonary edema.  She was admitted to [facility] Telemetry unit on 7/18 where she received IV lasix and Amiodarone gtt.  She continues to have Acute Hypoxic Respiratory failure, with CXR concerning for interstitial edema and possible Pneumonitis. Therefore PCCM is consulted for further management of Acute Hypoxic Respiratory Failure.  Currently, patient is resting comfortably, NAD, on 3 L [place] Has chronic Hypoxic resp failure on 2 L [place] at home Has some yellow sputum production with cough which could suggest BRonchitis Not using accessory muscles to breath  PAST MEDICAL HISTORY :   has a past medical history of A-fib (HCC); Chronic systolic CHF (congestive heart failure) (HCC); Coronary artery disease; S/P mitral valve clip implantation; and Status post aortic valve  replacement.  has a past surgical history that includes Coronary artery bypass graft. Prior to Admission medications   Medication Sig Start Date End Date Taking? Authorizing Provider  ferrous sulfate 325 (65 FE) MG tablet Take 325 mg by mouth daily with breakfast.   Yes [provider]  fluticasone (FLONASE) 50 MCG/ACT nasal spray Place 1 spray into both nostrils daily.   Yes [provider]  Fluticasone-Salmeterol (ADVAIR) 250-50 MCG/DOSE AEPB Inhale 1 puff into the lungs 2 (two) times daily.   Yes [provider]  furosemide (LASIX) 40 MG tablet Take 80 mg by mouth daily.    Yes [provider]  metoprolol tartrate (LOPRESSOR) 25 MG tablet Take 25 mg by mouth 2 (two) times daily.    Yes [provider]  Multiple Vitamins-Minerals (MULTIVITAMIN WITH MINERALS) tablet Take 1 tablet by mouth daily.    Yes [provider]  pantoprazole (PROTONIX) 40 MG tablet Take 40 mg by mouth daily.   Yes [provider]  potassium chloride (K-DUR) 10 MEQ tablet Take 10 mEq by mouth daily.    Yes [provider]  Vitamin K, Phytonadione, 100 MCG TABS Take 200 mcg by mouth daily.   Yes [provider]  warfarin (COUMADIN) 2.5 MG tablet Take 2.5 mg by mouth daily.   Yes [provider]   Allergies  Allergen Reactions  . Penicillins Itching and Rash    Other reaction(s): RASH  . Enalapril Other (See Comments)    cough  . Statins Other (See Comments)    Other reaction(s): MUSCLE PAIN    FAMILY HISTORY:  family history includes CAD in her mother. SOCIAL HISTORY:  reports that  she quit smoking about 23 years ago. She has a 25.00 pack-year smoking history. She has quit using smokeless tobacco. She reports that she does not drink alcohol or use drugs.  REVIEW OF SYSTEMS:  Positives in BOLD Constitutional: Negative for fever, chills, weight loss, +malaise/fatigue and diaphoresis.  HENT: Negative for hearing loss, ear  pain, nosebleeds, congestion, sore throat, neck pain, tinnitus and ear discharge.   Eyes: Negative for blurred vision, double vision, photophobia, pain, discharge and redness.  Respiratory: Negative for +cough, hemoptysis, +sputum production, shortness of breath, wheezing and stridor.   Cardiovascular: Negative for chest pain, palpitations, orthopnea, claudication, leg swelling and PND.  Gastrointestinal: Negative for heartburn, + Intermittent nausea, vomiting, abdominal pain, diarrhea, constipation, blood in stool and melena.  Genitourinary: Negative for dysuria, urgency, frequency, hematuria and flank pain.  Musculoskeletal: Negative for myalgias, back pain, joint pain and falls.  Skin: Negative for itching and rash.  Neurological: Negative for dizziness, tingling, tremors, sensory change, speech change, focal weakness, seizures, loss of consciousness, weakness and headaches.  Endo/Heme/Allergies: Negative for environmental allergies and polydipsia. Does not bruise/bleed easily.  SUBJECTIVE:  She states she is currently not in any pain or short of breath. She does report that she is extremely tired  VITAL SIGNS: Temp:  [98.4 F (36.9 C)-99.3 F (37.4 C)] 98.4 F (36.9 C) (07/20 0818) Pulse Rate:  [27-128] 100 (07/20 1047) Resp:  [18-24] 18 (07/20 0429) BP: (74-108)/(45-71) 83/56 (07/20 1047) SpO2:  [90 %-97 %] 91 % (07/20 1047) Weight:  [60.5 kg (133 lb 6.4 oz)] 60.5 kg (133 lb 6.4 oz) (07/20 0429)  PHYSICAL EXAMINATION: General:  Ill-appearing African American Female, Lying in bed with traction of left leg, in no acute distress Neuro:  Alert and oriented x4, CN II-XII intact, PERRLA, No scleral icterus HEENT:  Atraumatic, Normocephalic, No JVD Cardiovascular:  Irregularly irregular rhythm, No M/R/G, No peripheral edema Lungs:  Symmetrical expansion, Regular, Non-labored, No accessory muscle use, Mild Expiratory wheezing on Left Abdomen:  Soft, nontender, nondistended, Bowel sounds  present in all quadrants Musculoskeletal:  Muscle strength 5/5 in all extremities, except in left leg due to pain from fracture and being in traction.   Skin:  No rashes, lesions, or ulcers   Recent Labs Lab 04/15/2017 0312 05/03/17 0448 05/04/17 0600  NA 137 134* 134*  K 4.0 4.7 4.8  CL 99* 94* 93*  CO2 28 29 30   BUN 25* 31* 44*  CREATININE 1.40* 1.67* 2.24*  GLUCOSE 163* 123* 117*    Recent Labs Lab 05/10/2017 0312 05/03/17 0448 05/04/17 0600  HGB 9.7* 9.2* 9.4*  HCT 28.8* 27.0* 28.3*  WBC 15.2* 12.8* 14.6*  PLT 316 266 257   Dg Chest Port 1 View  Result Date: 05/03/2017 CLINICAL DATA:  83 y/o female with acute onset shortness of breath today. Hypoxia. EXAM: PORTABLE CHEST 1 VIEW COMPARISON:  05/01/2017 and earlier. FINDINGS: Portable AP upright view at 0801 hours. Stable large lung volumes. Stable cardiomegaly and mediastinal contours. Calcified aortic atherosclerosis. Mildly increased bilateral pulmonary interstitial opacity, which appears to be acute on chronic. No pneumothorax. No definite pleural effusion. No consolidation. Negative visible bowel gas pattern. Prior sternotomy. IMPRESSION: 1. Acute on chronic bilateral pulmonary interstitial opacity has mildly increased since yesterday. Consider acute viral/atypical respiratory infection and interstitial edema. No definite pleural effusion. 2. Underlying pulmonary hyperinflation, cardiomegaly, Calcified aortic atherosclerosis. Electronically Signed   By: Odessa FlemingH  Hall M.D.   On: 05/03/2017 08:52   I have Independently reviewed images of  CXR  on 05/04/2017 Interpretation: b/l interstitial infiltrates   ASSESSMENT / PLAN:  83 yo AAF Acute on chronic Hypoxic Respiratory Failure secondary to CHF exacerbation & Cardiorenal syndrome, Mild COPD exacerbation with acute bronchitis in the setting of acute hip fracture  After further assessment, patient has moderate Risk for Intra/Post op pulmonary and cardiac complications.  A-fib with  RVR -recommend cardiology follow up recs Cardiology consulted, appreciate input Amiodarone gtt for HR control per Cardiology recommendations Consider Diamox for Diuresis  COPD exacerbation -will continue abx as prescribed -prednisone 20 mg daily  -start BD therapy(spririva, dulera) -scheduled douneb and pulmicort nebs Supplemental O2 to maintain O2 sats between 88%-92%  Left Hip Fracture Maximize Pain control  Maximize Pulmonary toilet-incentive spirometry -oxygen as needed   Patient satisfied with Plan of action and management. All questions answered  Lucie Leather, M.D.  [facility]  Medical Director [facility] Medical Director [facility] Cardio-Pulmonary Department

## 2017-05-04 NOTE — Progress Notes (Addendum)
  Pharmacy Antibiotic Note  Cindy Weaver is a 83 y.o. female admitted on 17-Dec-2016 with pneumonia.  Pharmacy has been consulted for cefepime dosing.  Plan: After discussion with Dr Nemiah CommanderKalisetti, antibiotics will be changed from consult zosyn to cefepime d/t allergy to PCN. Cefepime 1gm iv q24h based on HCAP for Crcl of 16.176mL/min  Height: 5' 8.5" (174 cm) Weight: 133 lb 6.4 oz (60.5 kg) IBW/kg (Calculated) : 65.05  Temp (24hrs), Avg:99.3 F (37.4 C), Min:98.9 F (37.2 C), Max:100 F (37.8 C)   Recent Labs Lab 01-15-17 0312 05/03/17 0448 05/04/17 0600  WBC 15.2* 12.8* 14.6*  CREATININE 1.40* 1.67* 2.24*    Estimated Creatinine Clearance: 16.6 mL/min (A) (by C-G formula based on SCr of 2.24 mg/dL (H)).    Allergies  Allergen Reactions  . Penicillins Itching and Rash    Other reaction(s): RASH  . Enalapril Other (See Comments)    cough  . Statins Other (See Comments)    Other reaction(s): MUSCLE PAIN    Antimicrobials this admission: 7/20 Cefepime 1gm q24h >>   Dose adjustments this admission: Consult for cefepime dosing, 7/20 Crcl 16.496ml/min  Microbiology results:  BCx: N/A  UCx: N/A   Sputum: N/A   MRSA IHK:[id number]:Pending  Thank you for allowing pharmacy to be a part of this patient's care.  Luan PullingGarrett Krystie Leiter, PharmD, MBA, BCGP Clinical Pharmacist [facility]   05/04/2017 7:27 AM

## 2017-05-05 DIAGNOSIS — D649 Anemia, unspecified: Secondary | ICD-10-CM

## 2017-05-05 LAB — BASIC METABOLIC PANEL
ANION GAP: 12 (ref 5–15)
BUN: 58 mg/dL — ABNORMAL HIGH (ref 6–20)
CALCIUM: 8.5 mg/dL — AB (ref 8.9–10.3)
CO2: 30 mmol/L (ref 22–32)
Chloride: 93 mmol/L — ABNORMAL LOW (ref 101–111)
Creatinine, Ser: 2.45 mg/dL — ABNORMAL HIGH (ref 0.44–1.00)
GFR calc non Af Amer: 17 mL/min — ABNORMAL LOW (ref >60)
GFR, EST AFRICAN AMERICAN: 19 mL/min — AB (ref >60)
Glucose, Bld: 131 mg/dL — ABNORMAL HIGH (ref 65–99)
Potassium: 4.7 mmol/L (ref 3.5–5.1)
SODIUM: 135 mmol/L (ref 135–145)

## 2017-05-05 LAB — EXPECTORATED SPUTUM ASSESSMENT W GRAM STAIN, RFLX TO RESP C: Special Requests: NORMAL

## 2017-05-05 LAB — PROTIME-INR
INR: 3.5
PROTHROMBIN TIME: 36 s — AB (ref 11.4–15.2)

## 2017-05-05 MED ORDER — SODIUM CHLORIDE 0.9 % IV SOLN
INTRAVENOUS | Status: DC
  Administered 2017-05-05 – 2017-05-11 (×8): via INTRAVENOUS

## 2017-05-05 NOTE — Progress Notes (Signed)
[facility]   [name]    MR#:  [id number]  DATE OF BIRTH:  01/18/1926  SUBJECTIVE:  CHIEF COMPLAINT:   Chief Complaint  Patient presents with  . Fall  . Cough   -  No fevers. Pain better controlled. Requiring less amounts of oxygen today. -Heart rate is still difficult to be controlled.INR is higher than therapeutic while off of warfarin  REVIEW OF SYSTEMS:  Review of Systems  Constitutional: Positive for malaise/fatigue. Negative for chills and fever.  HENT: Negative for ear discharge, hearing loss, nosebleeds and sore throat.   Eyes: Negative for blurred vision and double vision.  Respiratory: Negative for cough, shortness of breath and wheezing.   Cardiovascular: Negative for chest pain, palpitations and leg swelling.  Gastrointestinal: Negative for abdominal pain, constipation, diarrhea, nausea and vomiting.  Genitourinary: Negative for dysuria.  Musculoskeletal: Positive for joint pain and myalgias.  Neurological: Negative for dizziness, seizures and headaches.    DRUG ALLERGIES:   Allergies  Allergen Reactions  . Penicillins Itching and Rash    Other reaction(s): RASH  . Enalapril Other (See Comments)    cough  . Statins Other (See Comments)    Other reaction(s): MUSCLE PAIN    VITALS:  Blood pressure (!) 95/59, pulse 95, temperature 98.2 F (36.8 C), temperature source Oral, resp. rate 18, height 5' 8.5" (1.74 m), weight 62.9 kg (138 lb 11.2 oz), SpO2 96 %.  PHYSICAL EXAMINATION:  Physical Exam  GENERAL:  83 y.o.-year-old patient lying in the bed with no acute distress. Appears uncomfortable in bed. Denies any pain EYES: Pupils equal, round, reactive to light and accommodation. No scleral icterus. Extraocular muscles intact.  HEENT: Head atraumatic, normocephalic. Oropharynx and nasopharynx clear.  NECK:  Supple, no jugular venous distention. No thyroid enlargement, no tenderness.  LUNGS:  Normal breath sounds bilaterally, no wheezing, rales,or crepitation. No use of accessory muscles of respiration. Rhonchi at the Lung bases, left greater than right. CARDIOVASCULAR: S1, S2 normal. No  rubs, or gallops. Loud 4/6 systolic murmur heard ABDOMEN: Soft, nontender, nondistended. Bowel sounds present. No organomegaly or mass.  EXTREMITIES: No pedal edema, cyanosis, or clubbing. Left leg in traction NEUROLOGIC: Cranial nerves II through XII are intact. Muscle strength 5/5 in all extremities except left leg due to pain from fracture and being on traction. Sensation intact. Gait not checked.  PSYCHIATRIC: The patient is alert and oriented x 3.  SKIN: No obvious rash, lesion, or ulcer.    LABORATORY PANEL:   CBC  Recent Labs Lab 05/04/17 0600  WBC 14.6*  HGB 9.4*  HCT 28.3*  PLT 257   ------------------------------------------------------------------------------------------------------------------  Chemistries   Recent Labs Lab 05/03/17 0448  05/05/17 0506  NA 134*  < > 135  K 4.7  < > 4.7  CL 94*  < > 93*  CO2 29  < > 30  GLUCOSE 123*  < > 131*  BUN 31*  < > 58*  CREATININE 1.67*  < > 2.45*  CALCIUM 8.5*  < > 8.5*  MG 1.8  --   --   < > = values in this interval not displayed. ------------------------------------------------------------------------------------------------------------------  Cardiac Enzymes  Recent Labs Lab 05/05/2017 2117  TROPONINI 0.16*   ------------------------------------------------------------------------------------------------------------------  RADIOLOGY:  No results found.  EKG:   Orders placed or performed during the hospital encounter of 05/10/2017  . ED EKG  . ED EKG    ASSESSMENT AND PLAN:   83 year old female  with multiple medical problems including A. fib on Coumadin, CAD status post CABG, mitral valve clipping for severe MR, bioprosthetic aortic valve replacement surgery, chronic respiratory failure on 2 L home oxygen,  Systolic CHF with EF of 45% presents to the hospital secondary to fall and noted to have left hip fracture.  #1 atrial fibrillation with rapid ventricular response-likely triggered by her acute systolic failure and hypoxia - Continue amiodarone drip.started on oral metoprolol. -Echocardiogram with EF of 35-40%. -Appreciate cardiology consult.started on digoxin. -Due to her surgery, Coumadin on hold.  -if still suboptimal heart rate control is noted, is to be a candidate for cardioversion.INR is therapeutic at this time.  #2 acute on chronic systolic CHF- received IV Lasix on admission. -remaining net negative of almost 2 L since admission -Due to worsening renal failure, will hold Lasix today. -Appreciate cardiology consult  #3 acute on chronic respiratory failure with hypoxia-secondary to systolic CHF-acute and chronic. -also significant pneumonitis noted on chest x-ray. MRSA PCR is pending. We'll start cefepime. -Continue to wean oxygen as tolerated. At home she is only on 2 L.. - requested pulmonary consult as well- since she will need surgery -Continue nebulizers.  #4 acute renal failure-Baseline creatinine seems to be between 1.2-1.3 -Currently at 2.2.. avoid nephrotoxins - ATn from hypotension and also diuresis, gentle hydration today - gradual worsening- called nephro consult.  #5 left hip fracture-secondary to mechanical fall. Ortho has been consulted. -Patient not ready for surgery yet. Continue to wait until HR is stable, cardiology clearance needed  -Coumadin  On hold,INR supratherapeutic- on Vit k oral . Continue pain medications for pain control.  #6 DVT prophylaxis - will need surgery and INR is supratherapeutic, on daily  vitamin K   All the records are reviewed and case discussed with Care Management/Social Workerr. Management plans discussed with the patient, family and they are in agreement.  CODE STATUS: Full code  TOTAL TIME TAKING CARE OF THIS PATIENT: 35  minutes.   POSSIBLE D/C IN 3-4 DAYS, DEPENDING ON CLINICAL CONDITION.   Altamese Dilling M.D on 05/05/2017 at 4:28 PM  Between 7am to 6pm - Pager - [phone]  After 6pm go to [email or web address] - Social research officer, government  [facility]  Office  [phone]  CC: Primary care physician; [facility]

## 2017-05-05 NOTE — Plan of Care (Signed)
Problem: Nutrition: Goal: Adequate nutrition will be maintained Outcome: Not Progressing Pt reports poor appetite and feeling nauseous while eating. Zofran given prior to meal. Pts intake slightly improved at evening meal. Pt encouraged to consume as much as she can to promote energy and healing.

## 2017-05-05 NOTE — Progress Notes (Signed)
Amio infusing at 33.873ml/hr per MD order. Pts HR is a-fib in low 100s. Pt is given prn pain medication as needed. Complaints of nausea, zofran given as needed.

## 2017-05-05 NOTE — Progress Notes (Signed)
ANTICOAGULATION CONSULT NOTE - Initial Consult  Pharmacy Consult for Heparin Drip/Bridging   Indication: atrial fibrillation  Allergies  Allergen Reactions  . Penicillins Itching and Rash    Other reaction(s): RASH  . Enalapril Other (See Comments)    cough  . Statins Other (See Comments)    Other reaction(s): MUSCLE PAIN    Patient Measurements: Height: 5' 8.5" (174 cm) Weight: 138 lb 11.2 oz (62.9 kg) IBW/kg (Calculated) : 65.05 Vital Signs: Temp: 98.2 F (36.8 C) (07/21 0555) Temp Source: Oral (07/21 0555) BP: 94/52 (07/21 0555) Pulse Rate: 102 (07/21 0555)  Labs:  Recent Labs  04/28/2017 1523 05/01/2017 2117 05/03/17 0448 05/04/17 0600 05/05/17 0506  HGB  --   --  9.2* 9.4*  --   HCT  --   --  27.0* 28.3*  --   PLT  --   --  266 257  --   LABPROT  --   --  41.9* 34.2* 36.0*  INR  --   --  4.24* 3.29 3.50  CREATININE  --   --  1.67* 2.24* 2.45*  TROPONINI 0.16* 0.16*  --   --   --     Estimated Creatinine Clearance: 15.8 mL/min (A) (by C-G formula based on SCr of 2.45 mg/dL (H)).   Medical History: Past Medical History:  Diagnosis Date  . A-fib (HCC)    a. on Coumadin; b. CHADS2VASc => 5 (CHF, age x 2, vascular disease, female)  . Chronic systolic CHF (congestive heart failure) (HCC)   . Coronary artery disease    a. s/p CABG w/ SVG-PRDA  . S/P mitral valve clip implantation   . Status post aortic valve replacement     Assessment: 83 yo female with PMH of A.Fib, admitted with left femoral neck fracture secondary to fall. Patient takes warfarin, which has been placed on hold.  Pharmacy consulted for heparin dosing for bridge therapy for orthopedic surgery.   7/18 INR: 3.59 (Vit K 0.2 mg PO) 7/19 INR: 4.24 (Vit K 0.2 mg PO) 7/20 INR 3.29 (Vit K 1mg  PO) 7/21 INR 3.50  Goal of Therapy:  INR <2 Heparin level 0.3-0.7 units/ml Monitor platelets by anticoagulation protocol: Yes  Plan:  INR is remains supratheraputic. Will recheck with AM labs tomorrow.  Once INR is less then 2, will initiate heparin 800u/hr.    Martyn MalayBarefoot,Kamali Sakata C, PharmD, BCPS Clinical Pharmacist 05/05/2017 10:20 AM

## 2017-05-05 NOTE — Progress Notes (Signed)
Patient Name: Cindy Weaver Date of Encounter: 05/05/2017  Primary Cardiologist: [facility]  Hospital Problem List     Principal Problem:   Hip fracture [facility](HCC) Active Problems:   Anemia   Atrial fibrillation with RVR (HCC)   Acute on chronic systolic CHF (congestive heart failure) (HCC)   Acute on chronic respiratory failure with hypoxia (HCC)   Status post aortic valve replacement   Status post mitral valve repair   AKI (acute kidney injury) (HCC)   History of Coumadin therapy   Elevated troponin   Hypotension   Malnutrition of moderate degree     Subjective   On rounds this morning, not very hungry, adequate pain control On amiodarone infusion 1 mg/m, stable heart rate 100 up to 110 She has had cough, congestion, on broad-spectrum antibiotics IV Family at the bedside Lab work reviewed, worsening renal function in the past several days   Inpatient Medications    Scheduled Meds: . albuterol  2.5 mg Nebulization Q4H  . budesonide (PULMICORT) nebulizer solution  0.5 mg Nebulization BID  . docusate sodium  100 mg Oral BID  . feeding supplement (ENSURE ENLIVE)  237 mL Oral TID BM  . ferrous sulfate  325 mg Oral Q breakfast  . fluticasone  1 spray Each Nare Daily  . metoprolol tartrate  25 mg Oral Q6H  . mometasone-formoterol  2 puff Inhalation BID  . multivitamin with minerals  1 tablet Oral Daily  . pantoprazole  40 mg Oral Daily  . phytonadione  1 mg Oral Daily  . predniSONE  20 mg Oral Q breakfast  . tiotropium  18 mcg Inhalation Daily   Continuous Infusions: . amiodarone 60 mg/hr (05/05/17 1341)  . ceFEPime (MAXIPIME) IV Stopped (05/05/17 0708)   PRN Meds: acetaminophen **OR** acetaminophen, HYDROcodone-acetaminophen, morphine injection, ondansetron **OR** ondansetron (ZOFRAN) IV, pneumococcal 23 valent vaccine   Vital Signs    Vitals:   05/05/17 0307 05/05/17 0555 05/05/17 0733 05/05/17 1413  BP: (!) 99/57 (!) 94/52 (!) 95/52 (!) 95/59  Pulse: (!) 107 (!)  102 (!) 113 95  Resp:  18  18  Temp:  98.2 F (36.8 C) 98.7 F (37.1 C) 98.2 F (36.8 C)  TempSrc:  Oral  Oral  SpO2:  97% 96%   Weight:  138 lb 11.2 oz (62.9 kg)    Height:        Intake/Output Summary (Last 24 hours) at 05/05/17 1501 Last data filed at 05/05/17 0348  Gross per 24 hour  Intake          1558.43 ml  Output              350 ml  Net          1208.43 ml   Filed Weights   04/03/17 0606 05/04/17 0429 05/05/17 0555  Weight: 124 lb 4.8 oz (56.4 kg) 133 lb 6.4 oz (60.5 kg) 138 lb 11.2 oz (62.9 kg)    Physical Exam    GEN: Thin, no acute distress HEENT: Grossly normal.  Neck: Supple, no JVD, carotid bruits, or masses. Cardiac:  irregularly irregular, no murmurs, rubs, or gallops. No clubbing, cyanosis, edema.  Radials/DP/PT 2+ and equal bilaterally.  Respiratory:  Respirations regular and unlabored, clear to auscultation bilaterally. GI: Soft, nontender, nondistended, BS + x 4. MS: no deformity or atrophy. Skin: warm and dry, no rash. Neuro:  Strength and sensation are intact. Psych: AAOx3.  Normal affect.  Labs    CBC  Recent Labs  05/03/17 0448 05/04/17 0600  WBC 12.8* 14.6*  HGB 9.2* 9.4*  HCT 27.0* 28.3*  MCV 94.9 95.3  PLT 266 257   Basic Metabolic Panel  Recent Labs  05/03/17 0448 05/04/17 0600 05/05/17 0506  NA 134* 134* 135  K 4.7 4.8 4.7  CL 94* 93* 93*  CO2 29 30 30   GLUCOSE 123* 117* 131*  BUN 31* 44* 58*  CREATININE 1.67* 2.24* 2.45*  CALCIUM 8.5* 8.8* 8.5*  MG 1.8  --   --    Liver Function Tests No results for input(s): AST, ALT, ALKPHOS, BILITOT, PROT, ALBUMIN in the last 72 hours. No results for input(s): LIPASE, AMYLASE in the last 72 hours. Cardiac Enzymes  Recent Labs  04/26/2017 1523 04/30/2017 2117  TROPONINI 0.16* 0.16*   BNP Invalid input(s): POCBNP D-Dimer No results for input(s): DDIMER in the last 72 hours. Hemoglobin A1C No results for input(s): HGBA1C in the last 72 hours. Fasting Lipid Panel No  results for input(s): CHOL, HDL, LDLCALC, TRIG, CHOLHDL, LDLDIRECT in the last 72 hours. Thyroid Function Tests No results for input(s): TSH, T4TOTAL, T3FREE, THYROIDAB in the last 72 hours.  Invalid input(s): FREET3  Telemetry    Afib with Rate 106 Personally Reviewed  ECG    n/a - Personally Reviewed  Radiology    Dg Chest Port 1 View  Result Date: 05/03/2017 IMPRESSION: 1. Acute on chronic bilateral pulmonary interstitial opacity has mildly increased since yesterday. Consider acute viral/atypical respiratory infection and interstitial edema. No definite pleural effusion. 2. Underlying pulmonary hyperinflation, cardiomegaly, Calcified aortic atherosclerosis. Electronically Signed   By: Odessa Fleming M.D.   On: 05/03/2017 08:52    Cardiac Studies   TTE 05/08/2017: Study Conclusions  - Left ventricle: The cavity size was normal. There was mild   concentric hypertrophy. Systolic function was moderately reduced.   The estimated ejection fraction was in the range of 35% to 40%.   The study was not technically sufficient to allow evaluation of   LV diastolic dysfunction due to atrial fibrillation. - Aortic valve: A bioprosthesis was present and functioning   normally. - Mitral valve: A mitral valve clip was present. The findings are   consistent with mild stenosis. There was moderate regurgitation.   Mean gradient (D): 6 mm Hg. - Left atrium: The atrium was moderately to severely dilated. - Right atrium: The atrium was mildly dilated. - Tricuspid valve: There was moderate regurgitation. - Pulmonic valve: There was moderate regurgitation. - Pulmonary arteries: Systolic pressure was mildly to moderately   increased. PA peak pressure: 48 mm Hg (S).  Patient Profile     83 y.o. female with history of coronary artery disease status post CABG, valvular heart disease status post aortic valve replacement with bioprosthetic valve and mitral valve clinic for severe regurgitation, chronic  atrial fibrillation and multiple other chronic medical conditions who presented after a fall and was found to have hip fracture.  Assessment & Plan    1. Chronic Afib with RVR: Would continue amiodarone infusion 1 mg/m, Rate around 100 With worsening renal failure, not a candidate for digoxin Warfarin on hold  2. Acute on chronic systolic CHF: -Echo showed worsening ejection fraction of 35-40% -Cardiomyopathy likely 2/2 Afib with RVR -Lasix on hold given worsening renal function -Metoprolol on hold for hypotension  3. Elevated troponin: -Likely supply demand ischemia  4. Preoperative cardiac evaluation: -Given multiple cardiac issues, the patient is at least at moderate risk for surgery.  Would continue on amiodarone infusion  through the surgical.  5. AKI: -May benefit from some gentle IV fluids  6. Anemia: -HGB remains low, though stable  7. Hip fracture: -Pain may be playing a role in her difficult to control ventricular rates Followed by orthopedics  8. Bronchitis On broad-spectrum antibiotics  Long discussion with family concerning details above  Total encounter time more than 25 minutes  Greater than 50% was spent in counseling and coordination of care with the patient   Signed, Signed, Dossie Arbour, MD, Ph.D [facility] 05/05/2017, 3:01 PM

## 2017-05-05 NOTE — Plan of Care (Signed)
Problem: Nutrition: Goal: Adequate nutrition will be maintained Outcome: Not Progressing Pt has a poor appetite. Pt is encouraged to eat at every meal. Pt is given ensure as ordered. Family at bedside are assisting at meal time to encourage intake.

## 2017-05-06 ENCOUNTER — Inpatient Hospital Stay: Payer: Medicare Other

## 2017-05-06 DIAGNOSIS — Z9889 Other specified postprocedural states: Secondary | ICD-10-CM

## 2017-05-06 DIAGNOSIS — I951 Orthostatic hypotension: Secondary | ICD-10-CM

## 2017-05-06 DIAGNOSIS — E440 Moderate protein-calorie malnutrition: Secondary | ICD-10-CM

## 2017-05-06 LAB — CBC
HCT: 27.2 % — ABNORMAL LOW (ref 35.0–47.0)
HEMATOCRIT: 24.2 % — AB (ref 35.0–47.0)
HEMOGLOBIN: 8.3 g/dL — AB (ref 12.0–16.0)
Hemoglobin: 9.3 g/dL — ABNORMAL LOW (ref 12.0–16.0)
MCH: 33.1 pg (ref 26.0–34.0)
MCH: 32.8 pg (ref 26.0–34.0)
MCHC: 34.5 g/dL (ref 32.0–36.0)
MCHC: 34 g/dL (ref 32.0–36.0)
MCV: 95.8 fL (ref 80.0–100.0)
MCV: 96.4 fL (ref 80.0–100.0)
Platelets: 219 10*3/uL (ref 150–440)
Platelets: 244 10*3/uL (ref 150–440)
RBC: 2.52 MIL/uL — AB (ref 3.80–5.20)
RBC: 2.82 MIL/uL — ABNORMAL LOW (ref 3.80–5.20)
RDW: 13.8 % (ref 11.5–14.5)
RDW: 13.8 % (ref 11.5–14.5)
WBC: 10.9 10*3/uL (ref 3.6–11.0)
WBC: 10.5 10*3/uL (ref 3.6–11.0)

## 2017-05-06 LAB — IRON AND TIBC
Iron: 17 ug/dL — ABNORMAL LOW (ref 28–170)
Saturation Ratios: 17 % (ref 10.4–31.8)
TIBC: 98 ug/dL — ABNORMAL LOW (ref 250–450)
UIBC: 81 ug/dL

## 2017-05-06 LAB — BASIC METABOLIC PANEL
Anion gap: 14 (ref 5–15)
BUN: 72 mg/dL — ABNORMAL HIGH (ref 6–20)
CO2: 24 mmol/L (ref 22–32)
Calcium: 8.5 mg/dL — ABNORMAL LOW (ref 8.9–10.3)
Chloride: 93 mmol/L — ABNORMAL LOW (ref 101–111)
Creatinine, Ser: 3.17 mg/dL — ABNORMAL HIGH (ref 0.44–1.00)
GFR calc Af Amer: 14 mL/min — ABNORMAL LOW (ref >60)
GFR calc non Af Amer: 12 mL/min — ABNORMAL LOW (ref >60)
Glucose, Bld: 140 mg/dL — ABNORMAL HIGH (ref 65–99)
Potassium: 5.3 mmol/L — ABNORMAL HIGH (ref 3.5–5.1)
Sodium: 131 mmol/L — ABNORMAL LOW (ref 135–145)

## 2017-05-06 LAB — PROTIME-INR
INR: 3.3
Prothrombin Time: 34.3 seconds — ABNORMAL HIGH (ref 11.4–15.2)

## 2017-05-06 LAB — PROTEIN / CREATININE RATIO, URINE
CREATININE, URINE: 77 mg/dL
Protein Creatinine Ratio: 1.81 mg/mg{Cre} — ABNORMAL HIGH (ref 0.00–0.15)
TOTAL PROTEIN, URINE: 139 mg/dL

## 2017-05-06 LAB — PROCALCITONIN: Procalcitonin: 3.35 ng/mL

## 2017-05-06 LAB — FERRITIN: Ferritin: 395 ng/mL — ABNORMAL HIGH (ref 11–307)

## 2017-05-06 MED ORDER — POLYETHYLENE GLYCOL 3350 17 G PO PACK
17.0000 g | PACK | Freq: Every day | ORAL | Status: DC
  Administered 2017-05-06 – 2017-05-17 (×8): 17 g via ORAL
  Filled 2017-05-06 (×10): qty 1

## 2017-05-06 MED ORDER — SENNA 8.6 MG PO TABS
1.0000 | ORAL_TABLET | Freq: Every day | ORAL | Status: DC | PRN
  Filled 2017-05-06: qty 1

## 2017-05-06 MED ORDER — ALBUTEROL SULFATE (2.5 MG/3ML) 0.083% IN NEBU
2.5000 mg | INHALATION_SOLUTION | Freq: Four times a day (QID) | RESPIRATORY_TRACT | Status: DC
  Administered 2017-05-06 – 2017-05-07 (×4): 2.5 mg via RESPIRATORY_TRACT
  Filled 2017-05-06 (×4): qty 3

## 2017-05-06 MED ORDER — SODIUM CHLORIDE 0.9 % IV BOLUS (SEPSIS)
500.0000 mL | Freq: Once | INTRAVENOUS | Status: AC
  Administered 2017-05-06: 500 mL via INTRAVENOUS

## 2017-05-06 NOTE — Progress Notes (Signed)
Orders reviewed, no further recs at this time  Please call back with questions  Will sign off at this time     Lucie LeatherKurian David Martesha Niedermeier, M.D.  [facility]  Medical Director [facility] Medical Director [facility]

## 2017-05-06 NOTE — Progress Notes (Signed)
[employer] [facility] [place] [facility]   PATIENT NAME: Cindy FoleyLiller Weaver    MR#:  [id number]  DATE OF BIRTH:  04/09/1926  SUBJECTIVE:  CHIEF COMPLAINT:   Chief Complaint  Patient presents with  . Fall  . Cough   -  No fevers. Pain better controlled. Requiring less amounts of oxygen today. -Heart rate is still difficult to be controlled.INR is higher than therapeutic while off of warfarin  have some cough and constipation.  REVIEW OF SYSTEMS:  Review of Systems  Constitutional: Positive for malaise/fatigue. Negative for chills and fever.  HENT: Negative for ear discharge, hearing loss, nosebleeds and sore throat.   Eyes: Negative for blurred vision and double vision.  Respiratory: Negative for cough, shortness of breath and wheezing.   Cardiovascular: Negative for chest pain, palpitations and leg swelling.  Gastrointestinal: Negative for abdominal pain, constipation, diarrhea, nausea and vomiting.  Genitourinary: Negative for dysuria.  Musculoskeletal: Positive for joint pain and myalgias.  Neurological: Negative for dizziness, seizures and headaches.    DRUG ALLERGIES:   Allergies  Allergen Reactions  . Penicillins Itching and Rash    Other reaction(s): RASH  . Enalapril Other (See Comments)    cough  . Statins Other (See Comments)    Other reaction(s): MUSCLE PAIN    VITALS:  Blood pressure 118/67, pulse 99, temperature 98.4 F (36.9 C), temperature source Oral, resp. rate 20, height 5' 8.5" (1.74 m), weight 63.6 kg (140 lb 3.2 oz), SpO2 97 %.  PHYSICAL EXAMINATION:  Physical Exam  GENERAL:  83 y.o.-year-old patient lying in the bed with no acute distress. Appears uncomfortable in bed. Denies any pain EYES: Pupils equal, round, reactive to light and accommodation. No scleral icterus. Extraocular muscles intact.  HEENT: Head atraumatic, normocephalic. Oropharynx and nasopharynx clear.  NECK:  Supple, no jugular venous distention. No thyroid  enlargement, no tenderness.  LUNGS: Normal breath sounds bilaterally, no wheezing, some crepitation. No use of accessory muscles of respiration. Rhonchi at the Lung bases, left greater than right. CARDIOVASCULAR: S1, S2 normal. No  rubs, or gallops. Loud 4/6 systolic murmur heard ABDOMEN: Soft, nontender, nondistended. Bowel sounds present. No organomegaly or mass.  EXTREMITIES: No pedal edema, cyanosis, or clubbing. Left leg in traction NEUROLOGIC: Cranial nerves II through XII are intact. Muscle strength 4/5 in all extremities except left leg due to pain from fracture and being on traction. Sensation intact. Gait not checked.  PSYCHIATRIC: The patient is alert and oriented x 3.  SKIN: No obvious rash, lesion, or ulcer.    LABORATORY PANEL:   CBC  Recent Labs Lab 05/06/17 0526  WBC 10.9  HGB 8.3*  HCT 24.2*  PLT 219   ------------------------------------------------------------------------------------------------------------------  Chemistries   Recent Labs Lab 05/03/17 0448  05/05/17 0506  NA 134*  < > 135  K 4.7  < > 4.7  CL 94*  < > 93*  CO2 29  < > 30  GLUCOSE 123*  < > 131*  BUN 31*  < > 58*  CREATININE 1.67*  < > 2.45*  CALCIUM 8.5*  < > 8.5*  MG 1.8  --   --   < > = values in this interval not displayed. ------------------------------------------------------------------------------------------------------------------  Cardiac Enzymes  Recent Labs Lab 05/13/2017 2117  TROPONINI 0.16*   ------------------------------------------------------------------------------------------------------------------  RADIOLOGY:  No results found.  EKG:   Orders placed or performed during the hospital encounter of 04/16/2017  . ED EKG  . ED EKG    ASSESSMENT AND  PLAN:   83 year old female with multiple medical problems including A. fib on Coumadin, CAD status post CABG, mitral valve clipping for severe MR, bioprosthetic aortic valve replacement surgery, chronic  respiratory failure on 2 L home oxygen, Systolic CHF with EF of 45% presents to the hospital secondary to fall and noted to have left hip fracture.  #1 atrial fibrillation with rapid ventricular response-likely triggered by her acute systolic failure and hypoxia - Continue amiodarone drip.started on oral metoprolol. -Echocardiogram with EF of 35-40%. -Appreciate cardiology consult.started on digoxin. -Due to her surgery, Coumadin on hold.   #2 acute on chronic systolic CHF- received IV Lasix on admission. -remaining net negative of almost 2 L since admission -Due to worsening renal failure, will hold Lasix . -Appreciate cardiology consult - now on gentle hydration for renal failure.  #3 acute on chronic respiratory failure with hypoxia-secondary to systolic CHF-acute and chronic. -also significant pneumonitis noted on chest x-ray. MRSA PCR is negative. We'll start cefepime. -Continue to wean oxygen as tolerated. At home she is only on 2 L.. - requested pulmonary consult as well- since she will need surgery -Continue nebulizers.  #4 acute renal failure-Baseline creatinine seems to be between 1.2-1.3 -Currently at 2.2.. avoid nephrotoxins - ATn from hypotension and also diuresis, gentle hydration today - gradual worsening- called nephro consult. - Cont hydration.  #5 left hip fracture-secondary to mechanical fall. Ortho has been consulted. -Patient not ready for surgery yet. Continue to wait until HR is stable, cardiology clearance needed  -Coumadin  On hold,INR supratherapeutic- on Vit k oral . Continue pain medications for pain control.  #6 DVT prophylaxis - will need surgery and INR is supratherapeutic, on daily  vitamin K  #7 constipation    Added miralax and senna.  All the records are reviewed and case discussed with Care Management/Social Workerr. Management plans discussed with the patient, family and they are in agreement.  CODE STATUS: Full code  TOTAL TIME TAKING CARE  OF THIS PATIENT: 35 minutes.   POSSIBLE D/C IN 3-4 DAYS, DEPENDING ON CLINICAL CONDITION.   Altamese Dilling M.D on 05/06/2017 at 1:59 PM  Between 7am to 6pm - Pager - [phone]  After 6pm go to [email or web address] - Social research officer, government  [facility]  Office  [phone]  CC: Primary care physician; [facility]

## 2017-05-06 NOTE — Progress Notes (Signed)
Pt complains of heel discomfort. RN applied lotion and massaged patients feet and elevated right heel and left heel placed back into buck skin traction. Pink foam applied to sacral area. No skin break down noted. I will continue continue to assess.

## 2017-05-06 NOTE — Progress Notes (Addendum)
Progress Note  Patient Name: Cindy Weaver Date of Encounter: 05/06/2017  [facility]  Subjective   Still with poor appetite, Family reports no bowel movement since this hospital admission ate little bit of applesauce, eggs, not much Pain control adequate Heart rate 90-100 on amiodarone infusion Start on low rate IV fluids yesterday for prerenal state, climbing creatinine No BMP today Blood pressure low but stable Lab work reviewed showing hematocrit 24, INR 3.3   Inpatient Medications    Scheduled Meds: . albuterol  2.5 mg Nebulization Q6H  . budesonide (PULMICORT) nebulizer solution  0.5 mg Nebulization BID  . docusate sodium  100 mg Oral BID  . feeding supplement (ENSURE ENLIVE)  237 mL Oral TID BM  . ferrous sulfate  325 mg Oral Q breakfast  . fluticasone  1 spray Each Nare Daily  . mometasone-formoterol  2 puff Inhalation BID  . multivitamin with minerals  1 tablet Oral Daily  . pantoprazole  40 mg Oral Daily  . phytonadione  1 mg Oral Daily  . polyethylene glycol  17 g Oral Daily  . predniSONE  20 mg Oral Q breakfast  . tiotropium  18 mcg Inhalation Daily   Continuous Infusions: . sodium chloride 60 mL/hr at 05/06/17 0537  . amiodarone 60 mg/hr (05/06/17 1110)  . ceFEPime (MAXIPIME) IV Stopped (05/06/17 1102)   PRN Meds: acetaminophen **OR** acetaminophen, HYDROcodone-acetaminophen, morphine injection, ondansetron **OR** ondansetron (ZOFRAN) IV, pneumococcal 23 valent vaccine, senna   Vital Signs    Vitals:   05/06/17 0009 05/06/17 0321 05/06/17 0359 05/06/17 1018  BP: (!) 89/48  (!) 93/43 118/67  Pulse:   100 99  Resp:    20  Temp:   97.9 F (36.6 C) 98.4 F (36.9 C)  TempSrc:   Oral Oral  SpO2:  94% 91% 97%  Weight:   140 lb 3.2 oz (63.6 kg)   Height:        Intake/Output Summary (Last 24 hours) at 05/06/17 1415 Last data filed at 05/06/17 0900  Gross per 24 hour  Intake            886.5 ml  Output              100 ml   Net            786.5 ml   Filed Weights   05/04/17 0429 05/05/17 0555 05/06/17 0359  Weight: 133 lb 6.4 oz (60.5 kg) 138 lb 11.2 oz (62.9 kg) 140 lb 3.2 oz (63.6 kg)    Telemetry    Atrial fibrillation with rate 90-100 - Personally Reviewed  ECG      Physical Exam   GEN: No acute distress, Thin, weak Neck: No JVD Cardiac: Irregularly irregular no murmurs, rubs, or gallops.  Respiratory: Clear to auscultation bilaterally. GI: Soft, nontender, non-distended  MS: No edema; No deformity. Neuro:  Nonfocal  Psych: Normal affect   Labs    Chemistry Recent Labs Lab 05/03/17 0448 05/04/17 0600 05/05/17 0506  NA 134* 134* 135  K 4.7 4.8 4.7  CL 94* 93* 93*  CO2 29 30 30   GLUCOSE 123* 117* 131*  BUN 31* 44* 58*  CREATININE 1.67* 2.24* 2.45*  CALCIUM 8.5* 8.8* 8.5*  GFRNONAA 26* 18* 17*  GFRAA 30* 21* 19*  ANIONGAP 11 11 12      Hematology Recent Labs Lab 05/03/17 0448 05/04/17 0600 05/06/17 0526  WBC 12.8* 14.6* 10.9  RBC 2.84* 2.97* 2.52*  HGB 9.2*  9.4* 8.3*  HCT 27.0* 28.3* 24.2*  MCV 94.9 95.3 95.8  MCH 32.2 31.7 33.1  MCHC 34.0 33.2 34.5  RDW 13.3 13.8 13.8  PLT 266 257 219    Cardiac Enzymes Recent Labs Lab 05/10/2017 0312 04/22/2017 0954 04/21/2017 1523 04/30/2017 2117  TROPONINI 0.03* 0.13* 0.16* 0.16*   No results for input(s): TROPIPOC in the last 168 hours.   BNP Recent Labs Lab 04/18/2017 0312  BNP 485.0*     DDimer No results for input(s): DDIMER in the last 168 hours.   Radiology    No results found.  Cardiac Studies     Hospital Problem List     Principal Problem:   Hip fracture (HCC) Active Problems:   Anemia   Atrial fibrillation with RVR (HCC)   Acute on chronic respiratory failure with hypoxia (HCC)   Status post aortic valve replacement   Status post mitral valve repair   AKI (acute kidney injury) (HCC)   History of Coumadin therapy   Elevated troponin   Hypotension   Malnutrition of moderate degree      Assessment & Plan    1. Chronic Afib with RVR: Would continue amiodarone infusion 1 mg/m, Rate 90 to 100  not a candidate for digoxin given renal dysfunction Warfarin on hold, INR still elevated, may need vitamin K or FFP  2. Acute on chronic systolic CHF: -Echo showed worsening ejection fraction of 35-40% -Cardiomyopathy likely 2/2 Afib with RVR -Lasix on hold given worsening renal function -Metoprolol on hold for hypotension  Started on fluids slow rate given prerenal state/climbing creatinine  3. Elevated troponin: -Likely supply demand ischemia  4. Preoperative cardiac evaluation: -Given multiple cardiac issues, the patient is at least at moderate risk for surgery.  Would continue on amiodarone infusion through the surgical Consider blood transfusion prior to surgery We will work on constipation today She may need iron infusion if iron studies are low  5. AKI: -May benefit from some gentle IV fluids  6. Anemia: -HGB remains low, Check iron studies Consider an iron infusion if low  7. Hip fracture: -Pain may be playing a role in her  ventricular rates Followed by orthopedics  8. Bronchitis On broad-spectrum antibiotics Improving  Long discussion with family concerning details above  Total encounter time more than 35 minutes  Greater than 50% was spent in counseling and coordination of care with the patient  Signed, Julien Nordmann, MD  05/06/2017, 2:15 PM

## 2017-05-06 NOTE — Progress Notes (Signed)
Pts BP is 75/45, HR 100 per telemetry. MD notified, orders to give bolus. I will continue to assess.

## 2017-05-06 NOTE — Plan of Care (Signed)
Problem: Skin Integrity: Goal: Risk for impaired skin integrity will decrease Outcome: Progressing Pink foam applied to sacral area, no break down noted. Dressing will be changed as needed.

## 2017-05-06 NOTE — Consult Note (Signed)
[facility] CONSULT NOTE    Date: 05/06/2017                  Patient Name:  Cindy Weaver  MRN: [id number]  DOB: 1926-10-03  Age / Sex: 83 y.o., female         PCP: [place], [place]: Hospitalist                 Reason for Consult: Acute renal failure/CKD stage III            History of Present Illness: Patient is a 83 y.o. female with a PMHx of Chronic kidney disease stage III baseline EGFR 52, congestive heart failure, hyperlipidemia, disease status post CABG, anemia of chronic kidney disease, chronic atrial fibrillation, severe mitral regurgitation, hypertension, GERD, COPD, who was admitted to [facility] on 05/03/2017 for evaluation of Fall. The patient came to the emergency department after having suffered a fall at home. She states that she was picking up something off the ground and began feeling dizzy and fell on her left side. She ended up sustaining a left hip fracture. In addition the patient was found to be in atrial fibrillation with rapid ventricular response. We are asked to see her for acute renal failure in the setting of known chronic kidney disease stage III. Her baseline creatinine is 1.18 with an EGFR 52. Patient was noted to be on Lasix 80 mg by mouth daily at home as well. She did have periods of dizziness. She denies ingestion of NSAIDs.   Medications: Outpatient medications: Prescriptions Prior to Admission  Medication Sig Dispense Refill Last Dose  . ferrous sulfate 325 (65 FE) MG tablet Take 325 mg by mouth daily with breakfast.   05/01/2017 at Unknown time  . fluticasone (FLONASE) 50 MCG/ACT nasal spray Place 1 spray into both nostrils daily.   05/01/2017 at Unknown time  . Fluticasone-Salmeterol (ADVAIR) 250-50 MCG/DOSE AEPB Inhale 1 puff into the lungs 2 (two) times daily.   05/01/2017 at Unknown time  . furosemide (LASIX) 40 MG tablet Take 80 mg by mouth daily.    05/01/2017 at Unknown  time  . metoprolol tartrate (LOPRESSOR) 25 MG tablet Take 25 mg by mouth 2 (two) times daily.    05/01/2017 at Unknown time  . Multiple Vitamins-Minerals (MULTIVITAMIN WITH MINERALS) tablet Take 1 tablet by mouth daily.    05/01/2017 at Unknown time  . pantoprazole (PROTONIX) 40 MG tablet Take 40 mg by mouth daily.   05/01/2017 at Unknown time  . potassium chloride (K-DUR) 10 MEQ tablet Take 10 mEq by mouth daily.    05/01/2017 at Unknown time  . Vitamin K, Phytonadione, 100 MCG TABS Take 200 mcg by mouth daily.   05/01/2017 at Unknown time  . warfarin (COUMADIN) 2.5 MG tablet Take 2.5 mg by mouth daily.   05/01/2017 at Unknown time    Current medications: Current Facility-Administered Medications  Medication Dose Route Frequency Provider Last Rate Last Dose  . 0.9 %  sodium chloride infusion   Intravenous Continuous Minna Merritts, MD 60 mL/hr at 05/06/17 0537    . acetaminophen (TYLENOL) tablet 650 mg  650 mg Oral Q6H PRN Harrie Foreman, MD       Or  . acetaminophen (TYLENOL) suppository 650 mg  650 mg Rectal Q6H PRN Harrie Foreman, MD      .  albuterol (PROVENTIL) (2.5 MG/3ML) 0.083% nebulizer solution 2.5 mg  2.5 mg Nebulization Q6H Vaughan Basta, MD   2.5 mg at 05/06/17 1406  . amiodarone (NEXTERONE PREMIX) 360-4.14 MG/200ML-% (1.8 mg/mL) IV infusion  60 mg/hr Intravenous Continuous Christell Faith M, PA-C 33.3 mL/hr at 05/06/17 1110 60 mg/hr at 05/06/17 1110  . budesonide (PULMICORT) nebulizer solution 0.5 mg  0.5 mg Nebulization BID Flora Lipps, MD   0.5 mg at 05/06/17 0817  . ceFEPIme (MAXIPIME) 1 g in dextrose 5 % 50 mL IVPB  1 g Intravenous Q24H Gladstone Lighter, MD   Stopped at 05/06/17 1102  . docusate sodium (COLACE) capsule 100 mg  100 mg Oral BID Harrie Foreman, MD   100 mg at 05/06/17 1105  . feeding supplement (ENSURE ENLIVE) (ENSURE ENLIVE) liquid 237 mL  237 mL Oral TID BM Gladstone Lighter, MD   237 mL at 05/06/17 0756  . ferrous sulfate tablet 325 mg  325 mg  Oral Q breakfast Harrie Foreman, MD   325 mg at 05/06/17 0756  . fluticasone (FLONASE) 50 MCG/ACT nasal spray 1 spray  1 spray Each Nare Daily Harrie Foreman, MD   1 spray at 05/06/17 1105  . HYDROcodone-acetaminophen (NORCO/VICODIN) 5-325 MG per tablet 1 tablet  1 tablet Oral Q4H PRN Gladstone Lighter, MD   1 tablet at 05/06/17 1242  . mometasone-formoterol (DULERA) 200-5 MCG/ACT inhaler 2 puff  2 puff Inhalation BID Harrie Foreman, MD   2 puff at 05/06/17 0757  . morphine 2 MG/ML injection 2 mg  2 mg Intravenous Q4H PRN Gladstone Lighter, MD   2 mg at 05/04/17 0335  . multivitamin with minerals tablet 1 tablet  1 tablet Oral Daily Harrie Foreman, MD   1 tablet at 05/06/17 1105  . ondansetron (ZOFRAN) tablet 4 mg  4 mg Oral Q6H PRN Harrie Foreman, MD       Or  . ondansetron [facility]) injection 4 mg  4 mg Intravenous Q6H PRN Harrie Foreman, MD   4 mg at 05/06/17 1242  . pantoprazole (PROTONIX) EC tablet 40 mg  40 mg Oral Daily Harrie Foreman, MD   40 mg at 05/06/17 1105  . phytonadione (VITAMIN K) oral solution 1 mg/0.5 mL  1 mg Oral Daily Gladstone Lighter, MD   1 mg at 05/06/17 1243  . pneumococcal 23 valent vaccine (PNU-IMMUNE) injection 0.5 mL  0.5 mL Intramuscular Prior to discharge Gladstone Lighter, MD      . polyethylene glycol (MIRALAX / GLYCOLAX) packet 17 g  17 g Oral Daily Vaughan Basta, MD   17 g at 05/06/17 1244  . predniSONE (DELTASONE) tablet 20 mg  20 mg Oral Q breakfast Flora Lipps, MD   20 mg at 05/06/17 0756  . senna (SENOKOT) tablet 8.6 mg  1 tablet Oral Daily PRN Vaughan Basta, MD      . tiotropium [facility]) inhalation capsule 18 mcg  18 mcg Inhalation Daily Flora Lipps, MD   18 mcg at 05/06/17 0757      Allergies: Allergies  Allergen Reactions  . Penicillins Itching and Rash    Other reaction(s): RASH  . Enalapril Other (See Comments)    cough  . Statins Other (See Comments)    Other reaction(s): MUSCLE PAIN       Past Medical History: Past Medical History:  Diagnosis Date  . A-fib ([place])    a. on Coumadin; b. CHADS2VASc => 5 (CHF, age x 2, vascular disease, female)  .  Chronic systolic CHF (congestive heart failure) ([place])   . Coronary artery disease    a. s/p CABG w/ SVG-PRDA  . S/P mitral valve clip implantation   . Status post aortic valve replacement      Past Surgical History: Past Surgical History:  Procedure Laterality Date  . AORTIC VALVE REPLACEMENT    . CORONARY ARTERY BYPASS GRAFT    . MITRAL VALVE REPAIR       Family History: Family History  Problem Relation Age of Onset  . CAD Mother      Social History: Social History   Social History  . Marital status: Widowed    Spouse name: N/A  . Number of children: N/A  . Years of education: N/A   Occupational History  . Not on file.   Social History Main Topics  . Smoking status: Former Smoker    Packs/day: 1.00    Years: 25.00    Quit date: 10/16/1993  . Smokeless tobacco: Former Systems developer  . Alcohol use No  . Drug use: No  . Sexual activity: Not on file   Other Topics Concern  . Not on file   Social History Narrative  . No narrative on file     Review of Systems: Review of Systems  Constitutional: Negative for chills, fever and malaise/fatigue.  HENT: Negative for ear pain, hearing loss and tinnitus.   Eyes: Negative for blurred vision, double vision and photophobia.  Respiratory: Negative for cough, hemoptysis and sputum production.   Cardiovascular: Positive for palpitations. Negative for chest pain.  Gastrointestinal: Negative for heartburn, nausea and vomiting.  Genitourinary: Negative for dysuria, frequency and urgency.  Musculoskeletal: Positive for joint pain.  Skin: Negative for itching and rash.  Neurological: Positive for dizziness. Negative for focal weakness.  Psychiatric/Behavioral: Negative for depression. The patient is not nervous/anxious.      Vital Signs: Blood pressure 118/67,  pulse 99, temperature 98.4 F (36.9 C), temperature source Oral, resp. rate 20, height 5' 8.5" (1.74 m), weight 63.6 kg (140 lb 3.2 oz), SpO2 97 %.  Weight trends: Filed Weights   05/04/17 0429 05/05/17 0555 05/06/17 0359  Weight: 60.5 kg (133 lb 6.4 oz) 62.9 kg (138 lb 11.2 oz) 63.6 kg (140 lb 3.2 oz)    Physical Exam: General: NAD, laying in bed  Head: Normocephalic, atraumatic.  Eyes: Anicteric, EOMI  Nose: Mucous membranes moist, not inflammed, nonerythematous.  Throat: Oropharynx nonerythematous, no exudate appreciated.   Neck: Supple, trachea midline.  Lungs:  Normal respiratory effort. Clear to auscultation BL without crackles or wheezes.  Heart: Irregular, tachycardic  Abdomen:  BS normoactive. Soft, Nondistended, non-tender.  No masses or organomegaly.  Extremities: Bilateral lower extremity edema, LLE in traction  Neurologic: A&O X3, follows commands with bilateral UE's  Skin: No visible rashes, scars.    Lab results: Basic Metabolic Panel:  Recent Labs Lab 04/15/2017 0954 05/03/17 0448 05/04/17 0600 05/05/17 0506  NA  --  134* 134* 135  K  --  4.7 4.8 4.7  CL  --  94* 93* 93*  CO2  --  _0 GLUCOSE  --  123* 117* 131*  BUN  --  31* 44* 58*  CREATININE  --  1.67* 2.24* 2.45*  CALCIUM  --  8.5* 8.8* 8.5*  MG 1.7 1.8  --   --     Liver Function Tests: No results for input(s): AST, ALT, ALKPHOS, BILITOT, PROT, ALBUMIN in the last 168 hours. No results for input(s): LIPASE,  AMYLASE in the last 168 hours. No results for input(s): AMMONIA in the last 168 hours.  CBC:  Recent Labs Lab 04/22/2017 0312 05/03/17 0448 05/04/17 0600 05/06/17 0526  WBC 15.2* 12.8* 14.6* 10.9  HGB 9.7* 9.2* 9.4* 8.3*  HCT 28.8* 27.0* 28.3* 24.2*  MCV 97.6 94.9 95.3 95.8  PLT 316 266 257 219    Cardiac Enzymes:  Recent Labs Lab 05/01/2017 0312 04/29/2017 0954 04/25/2017 1523 05/08/2017 2117  TROPONINI 0.03* 0.13* 0.16* 0.16*    BNP: Invalid input(s): POCBNP  CBG: No  results for input(s): GLUCAP in the last 168 hours.  Microbiology: Results for orders placed or performed during the hospital encounter of 04/27/2017  MRSA PCR Screening     Status: None   Collection Time: 05/04/17  8:27 AM  Result Value Ref Range Status   MRSA by PCR NEGATIVE NEGATIVE Final    Comment:        The GeneXpert MRSA Assay (FDA approved for NASAL specimens only), is one component of a comprehensive MRSA colonization surveillance program. It is not intended to diagnose MRSA infection nor to guide or monitor treatment for MRSA infections.   Culture, expectorated sputum-assessment     Status: None   Collection Time: 05/05/17 10:55 AM  Result Value Ref Range Status   Specimen Description SPUTUM  Final   Special Requests Normal  Final   Sputum evaluation THIS SPECIMEN IS ACCEPTABLE FOR SPUTUM CULTURE  Final   Report Status 05/05/2017 FINAL  Final  Culture, respiratory (NON-Expectorated)     Status: None (Preliminary result)   Collection Time: 05/05/17 10:55 AM  Result Value Ref Range Status   Specimen Description SPUTUM  Final   Special Requests Normal Reflexed from [id number]  Final   Gram Stain   Final    FEW WBC PRESENT, PREDOMINANTLY PMN FEW GRAM NEGATIVE RODS FEW GRAM POSITIVE COCCI IN PAIRS FEW GRAM POSITIVE RODS RARE BUDDING YEAST SEEN    Culture   Final    CULTURE REINCUBATED FOR BETTER GROWTH Performed at [facility], [address], [place], [place] [address]    Report Status PENDING  Incomplete    Coagulation Studies:  Recent Labs  05/04/17 0600 05/05/17 0506 05/06/17 0526  LABPROT 34.2* 36.0* 34.3*  INR 3.29 3.50 3.30    Urinalysis: No results for input(s): COLORURINE, LABSPEC, PHURINE, GLUCOSEU, HGBUR, BILIRUBINUR, KETONESUR, PROTEINUR, UROBILINOGEN, NITRITE, LEUKOCYTESUR in the last 72 hours.  Invalid input(s): APPERANCEUR    Imaging:  No results found.   Assessment & Plan: Pt is a 83 y.o. African American female  with a PMHx  of Chronic kidney disease stage III baseline EGFR 52, congestive heart failure, hyperlipidemia, disease status post CABG, anemia of chronic kidney disease, chronic atrial fibrillation, severe mitral regurgitation, hypertension, GERD, COPD, who was admitted to [facility] on 04/20/2017 for evaluation of Fall.  Patient developed left hip fracture and also had Atrial fibrillation with RVR and acute renal failure.   1. Acute renal failure/chronic kidney disease stage III baseline EGFR 52. Patient has been taking Lasix at home. Suspect that this may have played some role in volume depletion and subsequent acute renal failure. Agree with stopping diuretics And starting  The patient on IV fluid hydration with 0.9 normal saline.Check renal ultrasound to make sure there is no underlying obstruction.  2. Anemia of chronic kidney disease. Hemoglobin currently 8.3. Check SPEP and UPEP.  3. Hypotension. Could be secondary to diuretics as well as atrial fibrillation. Management as per hospitalist. Continue  IV fluids as above.

## 2017-05-06 NOTE — Progress Notes (Signed)
ANTICOAGULATION CONSULT NOTE - Initial Consult  Pharmacy Consult for Heparin Drip/Bridging   Indication: atrial fibrillation  Allergies  Allergen Reactions  . Penicillins Itching and Rash    Other reaction(s): RASH  . Enalapril Other (See Comments)    cough  . Statins Other (See Comments)    Other reaction(s): MUSCLE PAIN    Patient Measurements: Height: 5' 8.5" (174 cm) Weight: 140 lb 3.2 oz (63.6 kg) IBW/kg (Calculated) : 65.05 Vital Signs: Temp: 97.9 F (36.6 C) (07/22 0359) Temp Source: Oral (07/22 0359) BP: 93/43 (07/22 0359) Pulse Rate: 100 (07/22 0359)  Labs:  Recent Labs  05/04/17 0600 05/05/17 0506 05/06/17 0526  HGB 9.4*  --  8.3*  HCT 28.3*  --  24.2*  PLT 257  --  219  LABPROT 34.2* 36.0* 34.3*  INR 3.29 3.50 3.30  CREATININE 2.24* 2.45*  --     Estimated Creatinine Clearance: 15.9 mL/min (A) (by C-G formula based on SCr of 2.45 mg/dL (H)).   Medical History: Past Medical History:  Diagnosis Date  . A-fib ([facility])    a. on Coumadin; b. CHADS2VASc => 5 (CHF, age x 2, vascular disease, female)  . Chronic systolic CHF (congestive heart failure) ([facility])   . Coronary artery disease    a. s/p CABG w/ SVG-PRDA  . S/P mitral valve clip implantation   . Status post aortic valve replacement     Assessment: 83 yo female with PMH of A.Fib, admitted with left femoral neck fracture secondary to fall. Patient takes warfarin, which has been placed on hold.  Pharmacy consulted for heparin dosing for bridge therapy for orthopedic surgery.   7/18 INR: 3.59 (Vit K 0.2 mg PO) 7/19 INR: 4.24 (Vit K 0.2 mg PO) 7/20 INR 3.29 (Vit K 1mg  PO) 7/21 INR 3.50 (Vit K 1mg  PO) 7/22 INR 3.30  Goal of Therapy:  INR <2 Heparin level 0.3-0.7 units/ml Monitor platelets by anticoagulation protocol: Yes  Plan:  INR is remains supratheraputic. Will recheck with AM labs tomorrow. Once INR is less then 2, will initiate heparin 800u/hr.    Martyn MalayBarefoot,Olympia Adelsberger C, PharmD, BCPS Clinical  Pharmacist 05/06/2017 7:43 AM

## 2017-05-07 ENCOUNTER — Inpatient Hospital Stay: Payer: Medicare Other

## 2017-05-07 ENCOUNTER — Encounter: Admission: EM | Disposition: E | Payer: Self-pay | Source: Home / Self Care | Attending: Internal Medicine

## 2017-05-07 DIAGNOSIS — N189 Chronic kidney disease, unspecified: Secondary | ICD-10-CM

## 2017-05-07 DIAGNOSIS — I4891 Unspecified atrial fibrillation: Secondary | ICD-10-CM

## 2017-05-07 LAB — BASIC METABOLIC PANEL
ANION GAP: 14 (ref 5–15)
BUN: 81 mg/dL — ABNORMAL HIGH (ref 6–20)
CHLORIDE: 93 mmol/L — AB (ref 101–111)
CO2: 26 mmol/L (ref 22–32)
CREATININE: 3.5 mg/dL — AB (ref 0.44–1.00)
Calcium: 8.5 mg/dL — ABNORMAL LOW (ref 8.9–10.3)
GFR calc non Af Amer: 11 mL/min — ABNORMAL LOW (ref >60)
GFR, EST AFRICAN AMERICAN: 12 mL/min — AB (ref >60)
Glucose, Bld: 137 mg/dL — ABNORMAL HIGH (ref 65–99)
POTASSIUM: 5.2 mmol/L — AB (ref 3.5–5.1)
Sodium: 133 mmol/L — ABNORMAL LOW (ref 135–145)

## 2017-05-07 LAB — CULTURE, RESPIRATORY: SPECIAL REQUESTS: NORMAL

## 2017-05-07 LAB — CULTURE, RESPIRATORY W GRAM STAIN: Culture: NORMAL

## 2017-05-07 LAB — PROTIME-INR
INR: 2.85
Prothrombin Time: 30.5 seconds — ABNORMAL HIGH (ref 11.4–15.2)

## 2017-05-07 SURGERY — DIALYSIS/PERMA CATHETER INSERTION
Anesthesia: LOCAL

## 2017-05-07 MED ORDER — SODIUM CHLORIDE 0.9 % IV SOLN: INTRAVENOUS | Status: DC

## 2017-05-07 MED ORDER — DEXTROSE 5 % IV SOLN
250.0000 mg | Freq: Every day | INTRAVENOUS | Status: DC
  Administered 2017-05-07: 250 mg via INTRAVENOUS
  Filled 2017-05-07 (×2): qty 250

## 2017-05-07 MED ORDER — ALBUTEROL SULFATE (2.5 MG/3ML) 0.083% IN NEBU
2.5000 mg | INHALATION_SOLUTION | Freq: Two times a day (BID) | RESPIRATORY_TRACT | Status: DC
  Administered 2017-05-07 – 2017-05-09 (×4): 2.5 mg via RESPIRATORY_TRACT
  Filled 2017-05-07 (×3): qty 3

## 2017-05-07 MED ORDER — SENNA 8.6 MG PO TABS
1.0000 | ORAL_TABLET | Freq: Two times a day (BID) | ORAL | Status: DC
  Administered 2017-05-07 – 2017-05-10 (×7): 8.6 mg via ORAL
  Filled 2017-05-07 (×7): qty 1

## 2017-05-07 MED ORDER — ALBUTEROL SULFATE (2.5 MG/3ML) 0.083% IN NEBU
2.5000 mg | INHALATION_SOLUTION | Freq: Four times a day (QID) | RESPIRATORY_TRACT | Status: DC | PRN
  Filled 2017-05-07: qty 3

## 2017-05-07 MED ORDER — DEXTROSE 5 % IV SOLN
1.0000 g | INTRAVENOUS | Status: AC
  Administered 2017-05-08 – 2017-05-10 (×3): 1 g via INTRAVENOUS
  Filled 2017-05-07 (×4): qty 10

## 2017-05-07 NOTE — Consult Note (Signed)
[facility] Vascular Consult Note  MRN : [id number]  Cindy Weaver is a 83 y.o. (13-Sep-1926) female who presents with chief complaint of  Chief Complaint  Patient presents with  . Fall  . Cough  .  History of Present Illness: Patient is admitted to the hospital with a hip fracture and has developed acute renal failure.  The patient reports pain improved, but diminishing urine output.  The patient is critically ill with a. Fib with RVR and increasing BUN and Cr. The nephrology service has decided to initiate dialysis at this time, and we are asked to place a temporary dialysis catheter for immediate dialysis use.  No fever or chills.  Has baseline CKD but was stable prior to admission.  Current Facility-Administered Medications  Medication Dose Route Frequency Provider Last Rate Last Dose  . 0.9 %  sodium chloride infusion   Intravenous Continuous Antonieta Iba, MD 60 mL/hr at 2017/05/20 1158    . 0.9 %  sodium chloride infusion   Intravenous Continuous Annice Needy, MD      . acetaminophen (TYLENOL) tablet 650 mg  650 mg Oral Q6H PRN Arnaldo Natal, MD       Or  . acetaminophen (TYLENOL) suppository 650 mg  650 mg Rectal Q6H PRN Arnaldo Natal, MD      . albuterol (PROVENTIL) (2.5 MG/3ML) 0.083% nebulizer solution 2.5 mg  2.5 mg Nebulization BID Altamese Dilling, MD      . albuterol (PROVENTIL) (2.5 MG/3ML) 0.083% nebulizer solution 2.5 mg  2.5 mg Nebulization Q6H PRN Altamese Dilling, MD      . amiodarone (NEXTERONE PREMIX) 360-4.14 MG/200ML-% (1.8 mg/mL) IV infusion  60 mg/hr Intravenous Continuous Eula Listen M, PA-C 33.3 mL/hr at 2017-05-20 1318 60 mg/hr at 05/20/2017 1318  . azithromycin (ZITHROMAX) 250 mg in dextrose 5 % 125 mL IVPB  250 mg Intravenous q1800 Altamese Dilling, MD   Stopped at 05/20/17 1521  . budesonide (PULMICORT) nebulizer solution 0.5 mg  0.5 mg Nebulization BID Erin Fulling, MD   0.5 mg at May 20, 2017 0743  . [START ON  05/08/2017] cefTRIAXone (ROCEPHIN) 1 g in dextrose 5 % 50 mL IVPB  1 g Intravenous Q24H Altamese Dilling, MD      . docusate sodium (COLACE) capsule 100 mg  100 mg Oral BID Arnaldo Natal, MD   100 mg at May 20, 2017 0926  . feeding supplement (ENSURE ENLIVE) (ENSURE ENLIVE) liquid 237 mL  237 mL Oral TID BM Enid Baas, MD   237 mL at 05/20/17 0927  . ferrous sulfate tablet 325 mg  325 mg Oral Q breakfast Arnaldo Natal, MD   325 mg at 05/20/2017 0830  . fluticasone (FLONASE) 50 MCG/ACT nasal spray 1 spray  1 spray Each Nare Daily Arnaldo Natal, MD   1 spray at 20-May-2017 [phone]  . HYDROcodone-acetaminophen (NORCO/VICODIN) 5-325 MG per tablet 1 tablet  1 tablet Oral Q4H PRN Enid Baas, MD   1 tablet at 05/06/17 1725  . mometasone-formoterol (DULERA) 200-5 MCG/ACT inhaler 2 puff  2 puff Inhalation BID Arnaldo Natal, MD   2 puff at 2017/05/20 0830  . morphine 2 MG/ML injection 2 mg  2 mg Intravenous Q4H PRN Enid Baas, MD   2 mg at 05/04/17 0335  . multivitamin with minerals tablet 1 tablet  1 tablet Oral Daily Arnaldo Natal, MD   1 tablet at 05/20/17 [id number]  . ondansetron (ZOFRAN) tablet 4 mg  4 mg Oral  Q6H PRN Arnaldo Natal, MD       Or  . ondansetron [facility]) injection 4 mg  4 mg Intravenous Q6H PRN Arnaldo Natal, MD   4 mg at 05/06/17 1709  . pantoprazole (PROTONIX) EC tablet 40 mg  40 mg Oral Daily Arnaldo Natal, MD   40 mg at 04/17/2017 1610  . phytonadione (VITAMIN K) oral solution 1 mg/0.5 mL  1 mg Oral Daily Enid Baas, MD   1 mg at 04/15/2017 1100  . pneumococcal 23 valent vaccine (PNU-IMMUNE) injection 0.5 mL  0.5 mL Intramuscular Prior to discharge Enid Baas, MD      . polyethylene glycol (MIRALAX / GLYCOLAX) packet 17 g  17 g Oral Daily Altamese Dilling, MD   17 g at 05/09/2017 0927  . predniSONE (DELTASONE) tablet 20 mg  20 mg Oral Q breakfast Erin Fulling, MD   20 mg at 04/29/2017 0830  . senna (SENOKOT) tablet 8.6 mg   1 tablet Oral Daily PRN Altamese Dilling, MD      . senna (SENOKOT) tablet 8.6 mg  1 tablet Oral BID Altamese Dilling, MD   8.6 mg at 04/20/2017 1509  . tiotropium (SPIRIVA) inhalation capsule 18 mcg  18 mcg Inhalation Daily Erin Fulling, MD   18 mcg at 04/27/2017 0830    Past Medical History:  Diagnosis Date  . A-fib (HCC)    a. on Coumadin; b. CHADS2VASc => 5 (CHF, age x 2, vascular disease, female)  . Chronic systolic CHF (congestive heart failure) (HCC)   . Coronary artery disease    a. s/p CABG w/ SVG-PRDA  . S/P mitral valve clip implantation   . Status post aortic valve replacement     Past Surgical History:  Procedure Laterality Date  . AORTIC VALVE REPLACEMENT    . CORONARY ARTERY BYPASS GRAFT    . MITRAL VALVE REPAIR      Social History Social History  Substance Use Topics  . Smoking status: Former Smoker    Packs/day: 1.00    Years: 25.00    Quit date: 10/16/1993  . Smokeless tobacco: Former Neurosurgeon  . Alcohol use No    Family History Family History  Problem Relation Age of Onset  . CAD Mother   No bleeding disorders, clotting disorders, or autoimmune diseases  Allergies  Allergen Reactions  . Penicillins Itching and Rash    Other reaction(s): RASH  . Enalapril Other (See Comments)    cough  . Statins Other (See Comments)    Other reaction(s): MUSCLE PAIN     REVIEW OF SYSTEMS (Negative unless checked)  Constitutional: [] Weight loss  [] Fever  [] Chills Cardiac: [] Chest pain   [] Chest pressure   [x] Palpitations   [] Shortness of breath when laying flat   [] Shortness of breath at rest   [x] Shortness of breath with exertion. Vascular:  [] Pain in legs with walking   [] Pain in legs at rest   [] Pain in legs when laying flat   [] Claudication   [] Pain in feet when walking  [] Pain in feet at rest  [] Pain in feet when laying flat   [] History of DVT   [] Phlebitis   [] Swelling in legs   [] Varicose veins   [] Non-healing ulcers Pulmonary:   [] Uses home oxygen    [] Productive cough   [] Hemoptysis   [] Wheeze  [] COPD   [] Asthma Neurologic:  [] Dizziness  [] Blackouts   [] Seizures   [] History of stroke   [] History of TIA  [] Aphasia   [] Temporary blindness   []   Dysphagia   [] Weakness or numbness in arms   [] Weakness or numbness in legs Musculoskeletal:  [x] Arthritis   [] Joint swelling   [x] Joint pain   [] Low back pain Hematologic:  [] Easy bruising  [] Easy bleeding   [] Hypercoagulable state   [] Anemic  [] Hepatitis Gastrointestinal:  [] Blood in stool   [] Vomiting blood  [] Gastroesophageal reflux/heartburn   [] Difficulty swallowing. Genitourinary:  [x] Chronic kidney disease   [] Difficult urination  [] Frequent urination  [] Burning with urination   [] Blood in urine Skin:  [] Rashes   [] Ulcers   [] Wounds Psychological:  [] History of anxiety   []  History of major depression.       Physical Examination  Vitals:   04/26/2017 0000 04/15/2017 0211 05/05/2017 0453 04/18/2017 0500  BP: (!) 88/47  (!) 87/46   Pulse: 95  83   Resp:      Temp:   (!) 97.4 F (36.3 C)   TempSrc:   Oral   SpO2:  95% 93%   Weight:    63.9 kg (140 lb 12.8 oz)  Height:       Body mass index is 21.1 kg/m. Gen: WD/WN Head: [place]/AT, No temporalis wasting Ear/Nose/Throat: Hearing grossly intact, nares w/o erythema or drainage Eyes: Sclera non-icteric, conjunctiva clear Neck: Supple, no nuchal rigidity.  No JVD.  Pulmonary:  Good air movement, no use of accessory muscles Cardiac: RRR at this time. Vascular: feet warm, good capillary refill.   Gastrointestinal: soft, non-tender/non-distended. No guarding/reflex.  Musculoskeletal: Left leg in traction. Extremities without ischemic changes.  Mild Edema in the lower extremities bilaterally Neurologic: non-focal.  Left leg in traction. Psychiatric: A&O, normal affect and mood Dermatologic: No rashes or ulcers noted.   Lymph : No Cervical, Axillary, or Inguinal lymphadenopathy.      CBC Lab Results  Component Value Date   WBC 10.5 05/06/2017    HGB 9.3 (L) 05/06/2017   HCT 27.2 (L) 05/06/2017   MCV 96.4 05/06/2017   PLT 244 05/06/2017    BMET    Component Value Date/Time   NA 133 (L) 04/29/2017 0348   K 5.2 (H) 05/12/2017 0348   CL 93 (L) 05/05/2017 0348   CO2 26 05/06/2017 0348   GLUCOSE 137 (H) 04/16/2017 0348   BUN 81 (H) 04/18/2017 0348   CREATININE 3.50 (H) 04/18/2017 0348   CALCIUM 8.5 (L) 04/23/2017 0348   GFRNONAA 11 (L) 05/08/2017 0348   GFRAA 12 (L) 04/22/2017 0348   Estimated Creatinine Clearance: 11.2 mL/min (A) (by C-G formula based on SCr of 3.5 mg/dL (H)).  COAG Lab Results  Component Value Date   INR 2.85 04/29/2017   INR 3.30 05/06/2017   INR 3.50 05/05/2017    Radiology US Renal  Result Date: 05/08/2017 CLINICAL DATA:  83 year old female with history of acute renal failure. EXAM: RENAL / URINARY TRACT ULTRASOUND COMPLETE COMPARISON:  None. FINDINGS: Right Kidney: Length: 9.7 cm. Echogenicity within normal limits. Severe cortical thinning. No hydronephrosis. Trace amount of perinephric fluid. Two small anechoic lesions with increased through transmission in the upper and interpolar regions, measuring up to 1.5 x 1.3 x 1.9 cm. Left Kidney: Length: 11.2 cm. Echogenicity within normal limits. In the upper pole there is a 5.2 x 3.4 x 5.3 cm anechoic lesion with increased through transmission, compatible with a simple cyst. No mass hydronephrosis. Bladder: Completely decompressed with a Foley balloon catheter in place. IMPRESSION: 1. No hydronephrosis. 2. Diffuse cortical thinning in the right kidney. 3. Multiple small simple cysts in both kidneys. Electronically Signed  By: Trudie Reedaniel  Entrikin M.D.   On: 04/22/2017 11:10   Dg Chest Port 1 View  Result Date: 05/03/2017 CLINICAL DATA:  83 y/o female with acute onset shortness of breath today. Hypoxia. EXAM: PORTABLE CHEST 1 VIEW COMPARISON:  04/15/2017 and earlier. FINDINGS: Portable AP upright view at 0801 hours. Stable large lung volumes. Stable  cardiomegaly and mediastinal contours. Calcified aortic atherosclerosis. Mildly increased bilateral pulmonary interstitial opacity, which appears to be acute on chronic. No pneumothorax. No definite pleural effusion. No consolidation. Negative visible bowel gas pattern. Prior sternotomy. IMPRESSION: 1. Acute on chronic bilateral pulmonary interstitial opacity has mildly increased since yesterday. Consider acute viral/atypical respiratory infection and interstitial edema. No definite pleural effusion. 2. Underlying pulmonary hyperinflation, cardiomegaly, Calcified aortic atherosclerosis. Electronically Signed   By: Odessa FlemingH  Hall M.D.   On: 05/03/2017 08:52   Dg Chest Port 1 View  Result Date: 04/29/2017 CLINICAL DATA:  Fall EXAM: PORTABLE CHEST 1 VIEW COMPARISON:  Chest radiograph 11/24/2012 FINDINGS: Cardiomegaly is worsened from the prior study. There bilateral interstitial opacities, worst in the left lung base. No pneumothorax or sizable pleural effusion. No focal consolidation. IMPRESSION: Cardiomegaly, and calcific aortic atherosclerosis and suspected mild interstitial edema. Electronically Signed   By: Deatra RobinsonKevin  Herman M.D.   On: 04/25/2017 03:56   Dg Hip Unilat W Or Wo Pelvis 2-3 Views Left  Result Date: 04/19/2017 CLINICAL DATA:  Left hip pain after falling EXAM: DG HIP (WITH OR WITHOUT PELVIS) 2-3V LEFT COMPARISON:  None. FINDINGS: There is a transverse fracture of the left femoral neck with associated foreshortening. The femoral head remains approximated to the acetabulum. The right hip is unremarkable. No pelvic diastasis. IMPRESSION: Transverse fracture of the left femoral neck. Electronically Signed   By: Deatra RobinsonKevin  Herman M.D.   On: 04/27/2017 04:24      Assessment/Plan 1. ARF. Nephrology planning dialysis at this point. 2. Hip fracture.  In traction 3. A. Fib with RVR. Likely worse with volume overload from renal failure. 4. Coagulopathy. Unable to place tunneled cath at this time even if ESRD.   Will need INR <2 before tunneled catheter.   We will proceed with temporary dialysis catheter placement at this time.  Risks and benefits discussed with patient and/or family, and the catheter will be placed to allow immediate initiation of dialysis.  If the patient's renal function does not improve throughout the hospital course, we will be happy to place a tunneled dialysis catheter for long term use prior to discharge.     Festus BarrenJason Larnie Heart, MD  05/06/2017 5:30 PM

## 2017-05-07 NOTE — Op Note (Signed)
  OPERATIVE NOTE   PROCEDURE: 1. Ultrasound guidance for vascular access right femoral vein 2. Placement of a 30 cm triple lumen dialysis catheter right femoral vein  PRE-OPERATIVE DIAGNOSIS: 1. Acute renal failure 2. CKD 3. coagulopathy  POST-OPERATIVE DIAGNOSIS: Same  SURGEON: Festus BarrenJason Vollie Brunty, MD  ASSISTANT(S): None  ANESTHESIA: local  ESTIMATED BLOOD LOSS: Minimal   FINDING(S): 1. None  SPECIMEN(S): None  INDICATIONS:  Patient is a 83 y.o.female who presents with renal failure and need for dialysis.  Risks and benefits were discussed, and informed consent was obtained..  DESCRIPTION: After obtaining full informed written consent, the patient was laid flat in the bed. The right groin was sterilely prepped and draped in a sterile surgical field was created. The right femoral vein was visualized with ultrasound and found to be widely patent. It was then accessed under direct guidance without difficulty with a Seldinger needle and a permanent image was recorded. A J-wire was then placed. After skin nick and dilatation, a 30 cm triple lumen dialysis catheter was placed over the wire and the wire was removed. The lumens withdrew dark red nonpulsatile blood and flushed easily with sterile saline. The catheter was secured to the skin with 3 nylon sutures. Sterile dressing was placed.  COMPLICATIONS: None  CONDITION: Stable  Festus BarrenJason Desarie Feild 04/17/2017 5:34 PM  This note was created with Dragon Medical transcription system. Any errors in dictation are purely unintentional.

## 2017-05-07 NOTE — Progress Notes (Signed)
[employer] -  at [facility]   PATIENT NAME: Cindy Weaver    MR#:  [id number]  DATE OF BIRTH:  04/11/1926  SUBJECTIVE:  CHIEF COMPLAINT:   Chief Complaint  Patient presents with  . Fall  . Cough   -  No fevers. Pain better controlled. Requiring less amounts of oxygen today. -Heart rate is still difficult to be controlled.INR is higher than therapeutic while off of warfarin  have some cough and constipation.   Renal func is continued to get worse, minimal urine output.  REVIEW OF SYSTEMS:  Review of Systems  Constitutional: Positive for malaise/fatigue. Negative for chills and fever.  HENT: Negative for ear discharge, hearing loss, nosebleeds and sore throat.   Eyes: Negative for blurred vision and double vision.  Respiratory: Negative for cough, shortness of breath and wheezing.   Cardiovascular: Negative for chest pain, palpitations and leg swelling.  Gastrointestinal: Negative for abdominal pain, constipation, diarrhea, nausea and vomiting.  Genitourinary: Negative for dysuria.  Musculoskeletal: Positive for joint pain and myalgias.  Neurological: Positive for headaches. Negative for dizziness and seizures.    DRUG ALLERGIES:   Allergies  Allergen Reactions  . Penicillins Itching and Rash    Other reaction(s): RASH  . Enalapril Other (See Comments)    cough  . Statins Other (See Comments)    Other reaction(s): MUSCLE PAIN    VITALS:  Blood pressure (!) 108/45, pulse 82, temperature 98.4 F (36.9 C), temperature source Oral, resp. rate 20, height 5' 8.5" (1.74 m), weight 63.9 kg (140 lb 12.8 oz), SpO2 98 %.  PHYSICAL EXAMINATION:  Physical Exam  GENERAL:  83 y.o.-year-old patient lying in the bed with no acute distress. Appears uncomfortable in bed. Denies any pain EYES: Pupils equal, round, reactive to light and accommodation. No scleral icterus. Extraocular muscles intact.  HEENT: Head atraumatic, normocephalic. Oropharynx and  nasopharynx clear.  NECK:  Supple, no jugular venous distention. No thyroid enlargement, no tenderness.  LUNGS: Normal breath sounds bilaterally, no wheezing, some crepitation. No use of accessory muscles of respiration. Rhonchi at the Lung bases, left greater than right. CARDIOVASCULAR: S1, S2 normal. No  rubs, or gallops. Loud 4/6 systolic murmur heard ABDOMEN: Soft, nontender, nondistended. Bowel sounds present. No organomegaly or mass.  EXTREMITIES: No pedal edema, cyanosis, or clubbing. Left leg in traction NEUROLOGIC: Cranial nerves II through XII are intact. Muscle strength 4/5 in all extremities except left leg due to pain from fracture and being on traction. Sensation intact. Gait not checked.  PSYCHIATRIC: The patient is alert and oriented x 3.  SKIN: No obvious rash, lesion, or ulcer.    LABORATORY PANEL:   CBC  Recent Labs Lab 05/06/17 1757  WBC 10.5  HGB 9.3*  HCT 27.2*  PLT 244   ------------------------------------------------------------------------------------------------------------------  Chemistries   Recent Labs Lab 05/03/17 0448  05/15/2017 0348  NA 134*  < > 133*  K 4.7  < > 5.2*  CL 94*  < > 93*  CO2 29  < > 26  GLUCOSE 123*  < > 137*  BUN 31*  < > 81*  CREATININE 1.67*  < > 3.50*  CALCIUM 8.5*  < > 8.5*  MG 1.8  --   --   < > = values in this interval not displayed. ------------------------------------------------------------------------------------------------------------------  Cardiac Enzymes  Recent Labs Lab 07-07-17 2117  TROPONINI 0.16*   ------------------------------------------------------------------------------------------------------------------  RADIOLOGY:  [place] Renal  Result Date: 04/23/2017 CLINICAL DATA:  83 year old female with history of acute  renal failure. EXAM: RENAL / URINARY TRACT ULTRASOUND COMPLETE COMPARISON:  None. FINDINGS: Right Kidney: Length: 9.7 cm. Echogenicity within normal limits. Severe cortical thinning.  No hydronephrosis. Trace amount of perinephric fluid. Two small anechoic lesions with increased through transmission in the upper and interpolar regions, measuring up to 1.5 x 1.3 x 1.9 cm. Left Kidney: Length: 11.2 cm. Echogenicity within normal limits. In the upper pole there is a 5.2 x 3.4 x 5.3 cm anechoic lesion with increased through transmission, compatible with a simple cyst. No mass hydronephrosis. Bladder: Completely decompressed with a Foley balloon catheter in place. IMPRESSION: 1. No hydronephrosis. 2. Diffuse cortical thinning in the right kidney. 3. Multiple small simple cysts in both kidneys. Electronically Signed   By: Trudie Reed M.D.   On: 05/12/2017 11:10    EKG:   Orders placed or performed during the hospital encounter of 05/30/2017  . ED EKG  . ED EKG    ASSESSMENT AND PLAN:   83 year old female with multiple medical problems including A. fib on Coumadin, CAD status post CABG, mitral valve clipping for severe MR, bioprosthetic aortic valve replacement surgery, chronic respiratory failure on 2 L home oxygen, Systolic CHF with EF of 45% presents to the hospital secondary to fall and noted to have left hip fracture.  #1 atrial fibrillation with rapid ventricular response-likely triggered by her acute systolic failure and hypoxia - Continue amiodarone drip.started on oral metoprolol. -Echocardiogram with EF of 35-40%. -Appreciate cardiology consult.not a candidate for digoxin due to renal failure. -Due to her surgery, Coumadin on hold.  - amiodarone. HR is under control.  #2 acute on chronic systolic CHF- received IV Lasix on admission. -Due to worsening renal failure, will hold Lasix . - Appreciate cardiology consult - now on gentle hydration for renal failure.  #3 acute on chronic respiratory failure with hypoxia-secondary to systolic CHF-acute and chronic. -also significant pneumonitis noted on chest x-ray. MRSA PCR is negative. Was on cefepime. Switch to rocephin  now. -Continue to wean oxygen as tolerated. At home she is only on 2 L.. - requested pulmonary consult as well- since she will need surgery -Continue nebulizers.  #4 acute renal failure-Baseline creatinine seems to be between 1.2-1.3 - Gradually getting worse. avoid nephrotoxins - ATn from hypotension and also diuresis, gentle hydration today - gradual worsening- called nephro consult. - Cont hydration. Does not have much urine output.  - nephro planning to start HD.  #5 left hip fracture-secondary to mechanical fall. Ortho has been consulted. -Patient not ready for surgery yet. Continue to wait until HR is stable, cardiology clearance needed  -Coumadin  On hold,INR supratherapeutic- on Vit k oral . Continue pain medications for pain control.  #6 DVT prophylaxis - will need surgery and INR is supratherapeutic, on daily  vitamin K  #7 constipation    Added miralax and senna.  All the records are reviewed and case discussed with Care Management/Social Worker. Management plans discussed with the patient, family and they are in agreement.  CODE STATUS: Full code  TOTAL TIME TAKING CARE OF THIS PATIENT: 35 minutes.   POSSIBLE D/C IN 3-4 DAYS, DEPENDING ON CLINICAL CONDITION.   Altamese Dilling M.D on 04/20/2017 at 6:54 PM  Between 7am to 6pm - Pager - [phone]  After 6pm go to [email or web address] - Social research officer, government  [facility]  Office  [phone]  CC: Primary care physician; [facility]

## 2017-05-07 NOTE — Progress Notes (Signed)
[facility]  ROUNDING NOTE   Subjective:   Family at bedside. Daughter speaks for patient. Patient states she is doing well.   Na 133 K 5.2 Creatinine 3.5 (3.17)  Amiodarone gtt  Objective:  Vital signs in last 24 hours:  Temp:  [97.4 F (36.3 C)-98.3 F (36.8 C)] 97.4 F (36.3 C) (07/23 0453) Pulse Rate:  [83-112] 83 (07/23 0453) Resp:  [18] 18 (07/22 1500) BP: (75-95)/(46-56) 87/46 (07/23 0453) SpO2:  [93 %-98 %] 93 % (07/23 0453) Weight:  [63.9 kg (140 lb 12.8 oz)] 63.9 kg (140 lb 12.8 oz) (07/23 0500)  Weight change: 0.272 kg (9.6 oz) Filed Weights   05/05/17 0555 05/06/17 0359 05/09/2017 0500  Weight: 62.9 kg (138 lb 11.2 oz) 63.6 kg (140 lb 3.2 oz) 63.9 kg (140 lb 12.8 oz)    Intake/Output: I/O last 3 completed shifts: In: 3205.9 [P.O.:120; I.V.:3035.9; IV Piggyback:50] Out: 200 [Urine:200]   Intake/Output this shift:  No intake/output data recorded.  Physical Exam: General: NAD  Head: Normocephalic, atraumatic. Moist oral mucosal membranes  Eyes: Anicteric, PERRL  Neck: Supple, trachea midline  Lungs:  Clear to auscultation  Heart: irregular  Abdomen:  Soft, nontender  Extremities: no peripheral edema. Left hip in traction  Neurologic: Nonfocal, moving all four extremities  Skin: No lesions  Access: none    Basic Metabolic Panel:  Recent Labs Lab 04/28/2017 0954 05/03/17 0448 05/04/17 0600 05/05/17 0506 05/06/17 1757 04/26/2017 0348  NA  --  134* 134* 135 131* 133*  K  --  4.7 4.8 4.7 5.3* 5.2*  CL  --  94* 93* 93* 93* 93*  CO2  --  29 30 30 24 26   GLUCOSE  --  123* 117* 131* 140* 137*  BUN  --  31* 44* 58* 72* 81*  CREATININE  --  1.67* 2.24* 2.45* 3.17* 3.50*  CALCIUM  --  8.5* 8.8* 8.5* 8.5* 8.5*  MG 1.7 1.8  --   --   --   --     Liver Function Tests: No results for input(s): AST, ALT, ALKPHOS, BILITOT, PROT, ALBUMIN in the last 168 hours. No results for input(s): LIPASE, AMYLASE in the last 168 hours. No results for  input(s): AMMONIA in the last 168 hours.  CBC:  Recent Labs Lab 04/17/2017 0312 05/03/17 0448 05/04/17 0600 05/06/17 0526 05/06/17 1757  WBC 15.2* 12.8* 14.6* 10.9 10.5  HGB 9.7* 9.2* 9.4* 8.3* 9.3*  HCT 28.8* 27.0* 28.3* 24.2* 27.2*  MCV 97.6 94.9 95.3 95.8 96.4  PLT 316 266 257 219 244    Cardiac Enzymes:  Recent Labs Lab 05/03/2017 0312 05/12/2017 0954 04/26/2017 1523 04/18/2017 2117  TROPONINI 0.03* 0.13* 0.16* 0.16*    BNP: Invalid input(s): POCBNP  CBG: No results for input(s): GLUCAP in the last 168 hours.  Microbiology: Results for orders placed or performed during the hospital encounter of 05/12/2017  MRSA PCR Screening     Status: None   Collection Time: 05/04/17  8:27 AM  Result Value Ref Range Status   MRSA by PCR NEGATIVE NEGATIVE Final    Comment:        The GeneXpert MRSA Assay (FDA approved for NASAL specimens only), is one component of a comprehensive MRSA colonization surveillance program. It is not intended to diagnose MRSA infection nor to guide or monitor treatment for MRSA infections.   Culture, expectorated sputum-assessment     Status: None   Collection Time: 05/05/17 10:55 AM  Result Value Ref Range Status  Specimen Description SPUTUM  Final   Special Requests Normal  Final   Sputum evaluation THIS SPECIMEN IS ACCEPTABLE FOR SPUTUM CULTURE  Final   Report Status 05/05/2017 FINAL  Final  Culture, respiratory (NON-Expectorated)     Status: None (Preliminary result)   Collection Time: 05/05/17 10:55 AM  Result Value Ref Range Status   Specimen Description SPUTUM  Final   Special Requests Normal Reflexed from [id number]  Final   Gram Stain   Final    FEW WBC PRESENT, PREDOMINANTLY PMN FEW GRAM NEGATIVE RODS FEW GRAM POSITIVE COCCI IN PAIRS FEW GRAM POSITIVE RODS RARE BUDDING YEAST SEEN    Culture   Final    CULTURE REINCUBATED FOR BETTER GROWTH Performed at [facility], [place] [address], [place], [place] [address]    Report Status  PENDING  Incomplete    Coagulation Studies:  Recent Labs  05/05/17 0506 05/06/17 0526 04/19/2017 0348  LABPROT 36.0* 34.3* 30.5*  INR 3.50 3.30 2.85    Urinalysis: No results for input(s): COLORURINE, LABSPEC, PHURINE, GLUCOSEU, HGBUR, BILIRUBINUR, KETONESUR, PROTEINUR, UROBILINOGEN, NITRITE, LEUKOCYTESUR in the last 72 hours.  Invalid input(s): APPERANCEUR    Imaging: US Renal  Result Date: 05/03/2017 CLINICAL DATA:  83 year old female with history of acute renal failure. EXAM: RENAL / URINARY TRACT ULTRASOUND COMPLETE COMPARISON:  None. FINDINGS: Right Kidney: Length: 9.7 cm. Echogenicity within normal limits. Severe cortical thinning. No hydronephrosis. Trace amount of perinephric fluid. Two small anechoic lesions with increased through transmission in the upper and interpolar regions, measuring up to 1.5 x 1.3 x 1.9 cm. Left Kidney: Length: 11.2 cm. Echogenicity within normal limits. In the upper pole there is a 5.2 x 3.4 x 5.3 cm anechoic lesion with increased through transmission, compatible with a simple cyst. No mass hydronephrosis. Bladder: Completely decompressed with a Foley balloon catheter in place. IMPRESSION: 1. No hydronephrosis. 2. Diffuse cortical thinning in the right kidney. 3. Multiple small simple cysts in both kidneys. Electronically Signed   By: Vinnie Langton M.D.   On: 05/06/2017 11:10     Medications:   . sodium chloride 60 mL/hr at 04/24/2017 1158  . amiodarone 60 mg/hr (05/13/2017 0529)  . azithromycin    . ceFEPime (MAXIPIME) IV Stopped (05/06/2017 0900)   . albuterol  2.5 mg Nebulization BID  . budesonide (PULMICORT) nebulizer solution  0.5 mg Nebulization BID  . docusate sodium  100 mg Oral BID  . feeding supplement (ENSURE ENLIVE)  237 mL Oral TID BM  . ferrous sulfate  325 mg Oral Q breakfast  . fluticasone  1 spray Each Nare Daily  . mometasone-formoterol  2 puff Inhalation BID  . multivitamin with minerals  1 tablet Oral Daily  . pantoprazole   40 mg Oral Daily  . phytonadione  1 mg Oral Daily  . polyethylene glycol  17 g Oral Daily  . predniSONE  20 mg Oral Q breakfast  . tiotropium  18 mcg Inhalation Daily   acetaminophen **OR** acetaminophen, albuterol, HYDROcodone-acetaminophen, morphine injection, ondansetron **OR** ondansetron (ZOFRAN) IV, pneumococcal 23 valent vaccine, senna  Assessment/ Plan:  Cindy Weaver is a 83 y.o. black female with Chronic kidney disease stage III baseline EGFR 52, congestive heart failure, hyperlipidemia, disease status post CABG, anemia of chronic kidney disease, chronic atrial fibrillation, severe mitral regurgitation, hypertension, GERD, COPD, who was admitted to [facility] on 04/19/2017 for evaluation of Fall.  Patient developed left hip fracture and also had Atrial fibrillation with RVR and acute renal failure.  1. Acute renal failure on chronic kidney disease stage III: baseline creatinine 1.18, GFR of 52 from 04/01/17 Anuric to oliguric urine output.  BUN is higher, creatinine worsening.  - Continue IV fluids: NS at 83m/hr - Discussed dialysis with patient and family. Will get temp HD catheter placed by vascular surgery. Low threshold to start dialysis.   2. Anemia of chronic kidney disease. Hemoglobin 9.3.  3. Hypotension with atrial fibrillation with rapid ventricular response:  - on amiodarone gtt - Appreciate cardiology input. Monitor for exacerbation of congestive heart failure.    LOS: 5[place]7/23/20181:13 PM

## 2017-05-07 NOTE — Progress Notes (Signed)
ANTICOAGULATION CONSULT NOTE - Initial Consult  Pharmacy Consult for Heparin Drip/Bridging   Indication: atrial fibrillation  Allergies  Allergen Reactions  . Penicillins Itching and Rash    Other reaction(s): RASH  . Enalapril Other (See Comments)    cough  . Statins Other (See Comments)    Other reaction(s): MUSCLE PAIN    Patient Measurements: Height: 5' 8.5" (174 cm) Weight: 140 lb 12.8 oz (63.9 kg) IBW/kg (Calculated) : 65.05 Vital Signs: Temp: 97.4 F (36.3 C) (07/23 0453) Temp Source: Oral (07/23 0453) BP: 87/46 (07/23 0453) Pulse Rate: 83 (07/23 0453)  Labs:  Recent Labs  05/05/17 0506 05/06/17 0526 05/06/17 1757 05/04/2017 0348  HGB  --  8.3* 9.3*  --   HCT  --  24.2* 27.2*  --   PLT  --  219 244  --   LABPROT 36.0* 34.3*  --  30.5*  INR 3.50 3.30  --  2.85  CREATININE 2.45*  --  3.17* 3.50*    Estimated Creatinine Clearance: 11.2 mL/min (A) (by C-G formula based on SCr of 3.5 mg/dL (H)).   Medical History: Past Medical History:  Diagnosis Date  . A-fib (HCC)    a. on Coumadin; b. CHADS2VASc => 5 (CHF, age x 2, vascular disease, female)  . Chronic systolic CHF (congestive heart failure) (HCC)   . Coronary artery disease    a. s/p CABG w/ SVG-PRDA  . S/P mitral valve clip implantation   . Status post aortic valve replacement     Assessment: 83 yo female with PMH of A.Fib, admitted with left femoral neck fracture secondary to fall. Patient takes warfarin, which has been placed on hold.  Pharmacy consulted for heparin dosing for bridge therapy for orthopedic surgery.   7/18 INR: 3.59 (Vit K 0.2 mg PO) 7/19 INR: 4.24 (Vit K 0.2 mg PO) 7/20 INR 3.29 (Vit K 1mg  PO) 7/21 INR 3.50 (Vit K 1mg  PO) 7/22 INR 3.30 (Vit K 1mg  PO) 7/23 INR 2.85   Goal of Therapy:  INR <2 Heparin level 0.3-0.7 units/ml Monitor platelets by anticoagulation protocol: Yes  Plan:  INR is remains supratheraputic. Will recheck with AM labs tomorrow. Once INR is less then 2,  will initiate heparin 800u/hr.    Martyn MalayBarefoot,Zalma Channing C, PharmD, BCPS Clinical Pharmacist 04/25/2017 7:32 AM

## 2017-05-07 NOTE — Progress Notes (Signed)
  Pharmacy Antibiotic Note  Cindy Weaver is a 83 y.o. female admitted on 05/07/2017 with hip fracture, fib with RVR, found to have pneumonia. Pharmacy consulted to dose cefepime  Plan: Continue cefepime 1gm IV Q24H  Height: 5' 8.5" (174 cm) Weight: 140 lb 12.8 oz (63.9 kg) IBW/kg (Calculated) : 65.05  Temp (24hrs), Avg:98.1 F (36.7 C), Min:97.4 F (36.3 C), Max:98.4 F (36.9 C)   Recent Labs Lab 05/09/2017 0312 05/03/17 0448 05/04/17 0600 05/05/17 0506 05/06/17 0526 05/06/17 1757 05/06/2017 0348  WBC 15.2* 12.8* 14.6*  --  10.9 10.5  --   CREATININE 1.40* 1.67* 2.24* 2.45*  --  3.17* 3.50*    Estimated Creatinine Clearance: 11.2 mL/min (A) (by C-G formula based on SCr of 3.5 mg/dL (H)).    Allergies  Allergen Reactions  . Penicillins Itching and Rash    Other reaction(s): RASH  . Enalapril Other (See Comments)    cough  . Statins Other (See Comments)    Other reaction(s): MUSCLE PAIN    Antimicrobials this admission: 7/20 Cefepime 1gm q24h >>   Dose adjustments this admission: Consult for cefepime dosing, 7/20 Crcl 16.146ml/min  Microbiology results:  Sputum: pending  MRSA ZOX:[id number]:Pending  Thank you for allowing pharmacy to be a part of this patient's care.  Garlon HatchetJody Deysi Soldo, PharmD, BCPS Clinical Pharmacist  04/21/2017 8:33 AM

## 2017-05-07 NOTE — Progress Notes (Signed)
Patient Name: Cindy Weaver Date of Encounter: 04/20/2017  [facility]  Hospital Problem List     Principal Problem:   Hip fracture [facility](HCC) Active Problems:   Atrial fibrillation with RVR (HCC)   Acute on chronic systolic CHF (congestive heart failure) (HCC)   Acute on chronic respiratory failure with hypoxia (HCC)   Anemia   Status post aortic valve replacement   Status post mitral valve repair   AKI (acute kidney injury) (HCC)   History of Coumadin therapy   Elevated troponin   Hypotension   Malnutrition of moderate degree     Subjective   Heart rate better controlled this morning in the 80s to 90s bpm, remains in Afib. No pain. Mild orthopnea. No chest pain. INR improving. SCR remains up trending 3.17-->3.50. BP soft in the upper 80s systolic.   Inpatient Medications    Scheduled Meds: . albuterol  2.5 mg Nebulization Q6H  . budesonide (PULMICORT) nebulizer solution  0.5 mg Nebulization BID  . docusate sodium  100 mg Oral BID  . feeding supplement (ENSURE ENLIVE)  237 mL Oral TID BM  . ferrous sulfate  325 mg Oral Q breakfast  . fluticasone  1 spray Each Nare Daily  . mometasone-formoterol  2 puff Inhalation BID  . multivitamin with minerals  1 tablet Oral Daily  . pantoprazole  40 mg Oral Daily  . phytonadione  1 mg Oral Daily  . polyethylene glycol  17 g Oral Daily  . predniSONE  20 mg Oral Q breakfast  . tiotropium  18 mcg Inhalation Daily   Continuous Infusions: . sodium chloride 60 mL/hr at 05/06/17 1713  . amiodarone 60 mg/hr (04/18/2017 0529)  . ceFEPime (MAXIPIME) IV Stopped (05/06/17 1102)   PRN Meds: acetaminophen **OR** acetaminophen, HYDROcodone-acetaminophen, morphine injection, ondansetron **OR** ondansetron (ZOFRAN) IV, pneumococcal 23 valent vaccine, senna   Vital Signs    Vitals:   05/12/2017 0000 05/06/2017 0211 04/19/2017 0453 04/18/2017 0500  BP: (!) 88/47  (!) 87/46   Pulse: 95  83   Resp:        Temp:   (!) 97.4 F (36.3 C)   TempSrc:   Oral   SpO2:  95% 93%   Weight:    140 lb 12.8 oz (63.9 kg)  Height:        Intake/Output Summary (Last 24 hours) at 04/29/2017 0726 Last data filed at 05/06/2017 0700  Gross per 24 hour  Intake          3205.85 ml  Output              100 ml  Net          3105.85 ml   Filed Weights   05/05/17 0555 05/06/17 0359 05/09/2017 0500  Weight: 138 lb 11.2 oz (62.9 kg) 140 lb 3.2 oz (63.6 kg) 140 lb 12.8 oz (63.9 kg)    Physical Exam    GEN: Well nourished, well developed, in no acute distress.  HEENT: Grossly normal.  Neck: Supple, no JVD, carotid bruits, or masses. Cardiac: Irregularly irregular, no murmurs, rubs, or gallops. No clubbing, cyanosis, edema.  Radials/DP/PT 2+ and equal bilaterally.  Respiratory:  Respirations regular and unlabored, clear to auscultation bilaterally. GI: Soft, nontender, nondistended, BS + x 4. MS: no deformity or atrophy. Skin: warm and dry, no rash. Neuro:  Strength and sensation are intact. Psych: AAOx3.  Normal affect.  Labs    CBC  Recent  Labs  05/06/17 0526 05/06/17 1757  WBC 10.9 10.5  HGB 8.3* 9.3*  HCT 24.2* 27.2*  MCV 95.8 96.4  PLT 219 244   Basic Metabolic Panel  Recent Labs  05/06/17 1757 05/13/2017 0348  NA 131* 133*  K 5.3* 5.2*  CL 93* 93*  CO2 24 26  GLUCOSE 140* 137*  BUN 72* 81*  CREATININE 3.17* 3.50*  CALCIUM 8.5* 8.5*   Liver Function Tests No results for input(s): AST, ALT, ALKPHOS, BILITOT, PROT, ALBUMIN in the last 72 hours. No results for input(s): LIPASE, AMYLASE in the last 72 hours. Cardiac Enzymes No results for input(s): CKTOTAL, CKMB, CKMBINDEX, TROPONINI in the last 72 hours. BNP Invalid input(s): POCBNP D-Dimer No results for input(s): DDIMER in the last 72 hours. Hemoglobin A1C No results for input(s): HGBA1C in the last 72 hours. Fasting Lipid Panel No results for input(s): CHOL, HDL, LDLCALC, TRIG, CHOLHDL, LDLDIRECT in the last 72 hours. Thyroid  Function Tests No results for input(s): TSH, T4TOTAL, T3FREE, THYROIDAB in the last 72 hours.  Invalid input(s): FREET3  Telemetry    Afib, 80s to 90s bpm - Personally Reviewed  ECG    n/a - Personally Reviewed  Radiology    No results found.  Cardiac Studies   TTE 04/24/2017: Study Conclusions  - Left ventricle: The cavity size was normal. There was mild   concentric hypertrophy. Systolic function was moderately reduced.   The estimated ejection fraction was in the range of 35% to 40%.   The study was not technically sufficient to allow evaluation of   LV diastolic dysfunction due to atrial fibrillation. - Aortic valve: A bioprosthesis was present and functioning   normally. - Mitral valve: A mitral valve clip was present. The findings are   consistent with mild stenosis. There was moderate regurgitation.   Mean gradient (D): 6 mm Hg. - Left atrium: The atrium was moderately to severely dilated. - Right atrium: The atrium was mildly dilated. - Tricuspid valve: There was moderate regurgitation. - Pulmonic valve: There was moderate regurgitation. - Pulmonary arteries: Systolic pressure was mildly to moderately   increased. PA peak pressure: 48 mm Hg (S).  Patient Profile     83 y.o. female with history of coronary artery disease status post CABG, valvular heart disease status post aortic valve replacement with bioprosthetic valve and mitral valve clinic for severe regurgitation, chronic atrial fibrillation and multiple other chronic medical conditions who presented after a fall and was found to have hip fracture.  Assessment & Plan    1. Chronic Afib with RVR: -Heart rates improved in the 80s to 90s bpm, remains in Afib -BP soft which does not allow for addition of metoprolol  -Not an ideal candidate for CCB given her cardiomyopathy and soft BP -Not an ideal candidate for digoxin given her AKI with SCr 2. -Decrease IV amiodarone back to half-dose given improved rates  and soft BP -May need attempt at TEE/DCCV given suboptimal rate control -INR down trending for orthopedic surgery at a date to be determined   2. Acute on chronic systolic CHF: -Echo showed worsening ejection fraction of 35-40% -Cardiomyopathy likely 2/2 Afib with RVR -Will need adequate rate control followed by repeat echo in ~ 1 month -Lasix on hold given AKI -Metoprolol as above  3. Elevated troponin: -Likely supply demand ischemia  4. Preoperative cardiac evaluation: -Given multiple cardiac issues, the patient is at least at moderate risk for surgery. We have to try to control her atrial fibrillation  better before proceeding  5. AKI: -Lasix on hold -IV fluids  6. Anemia: -HGB remains low, though stable  7. Hip fracture: -Pain may be playing a role in her difficult to control ventricular rates  Signed, Eula Listen, PA-C [facility] Pager: [phone] 04/20/2017, 7:26 AM

## 2017-05-07 NOTE — Plan of Care (Signed)
Problem: Skin Integrity: Goal: Risk for impaired skin integrity will decrease Outcome: Progressing Patient has a poor appetite but will drink strawberry ensure. Patient refuses to be repositioned q2h. She prefers to lie on her right side.

## 2017-05-08 DIAGNOSIS — S72001D Fracture of unspecified part of neck of right femur, subsequent encounter for closed fracture with routine healing: Secondary | ICD-10-CM

## 2017-05-08 DIAGNOSIS — Z515 Encounter for palliative care: Secondary | ICD-10-CM

## 2017-05-08 DIAGNOSIS — N179 Acute kidney failure, unspecified: Secondary | ICD-10-CM

## 2017-05-08 DIAGNOSIS — S72002D Fracture of unspecified part of neck of left femur, subsequent encounter for closed fracture with routine healing: Secondary | ICD-10-CM

## 2017-05-08 DIAGNOSIS — S72145D Nondisplaced intertrochanteric fracture of left femur, subsequent encounter for closed fracture with routine healing: Secondary | ICD-10-CM

## 2017-05-08 DIAGNOSIS — R05 Cough: Secondary | ICD-10-CM

## 2017-05-08 DIAGNOSIS — R112 Nausea with vomiting, unspecified: Secondary | ICD-10-CM

## 2017-05-08 DIAGNOSIS — Z7189 Other specified counseling: Secondary | ICD-10-CM

## 2017-05-08 LAB — PROTIME-INR
INR: 2.48
Prothrombin Time: 27.3 seconds — ABNORMAL HIGH (ref 11.4–15.2)

## 2017-05-08 LAB — BASIC METABOLIC PANEL
Anion gap: 15 (ref 5–15)
BUN: 90 mg/dL — ABNORMAL HIGH (ref 6–20)
CALCIUM: 8.5 mg/dL — AB (ref 8.9–10.3)
CO2: 23 mmol/L (ref 22–32)
CREATININE: 3.59 mg/dL — AB (ref 0.44–1.00)
Chloride: 90 mmol/L — ABNORMAL LOW (ref 101–111)
GFR calc non Af Amer: 10 mL/min — ABNORMAL LOW (ref >60)
GFR, EST AFRICAN AMERICAN: 12 mL/min — AB (ref >60)
Glucose, Bld: 101 mg/dL — ABNORMAL HIGH (ref 65–99)
Potassium: 5.6 mmol/L — ABNORMAL HIGH (ref 3.5–5.1)
SODIUM: 128 mmol/L — AB (ref 135–145)

## 2017-05-08 LAB — CBC
HCT: 24.3 % — ABNORMAL LOW (ref 35.0–47.0)
Hemoglobin: 8.3 g/dL — ABNORMAL LOW (ref 12.0–16.0)
MCH: 32.2 pg (ref 26.0–34.0)
MCHC: 34.1 g/dL (ref 32.0–36.0)
MCV: 94.4 fL (ref 80.0–100.0)
PLATELETS: 230 10*3/uL (ref 150–440)
RBC: 2.58 MIL/uL — AB (ref 3.80–5.20)
RDW: 13.9 % (ref 11.5–14.5)
WBC: 8.5 10*3/uL (ref 3.6–11.0)

## 2017-05-08 LAB — PROCALCITONIN: Procalcitonin: 1.67 ng/mL

## 2017-05-08 MED ORDER — AZITHROMYCIN 250 MG PO TABS
250.0000 mg | ORAL_TABLET | Freq: Every day | ORAL | Status: DC
  Administered 2017-05-08 – 2017-05-10 (×3): 250 mg via ORAL
  Filled 2017-05-08 (×3): qty 1

## 2017-05-08 MED ORDER — TUBERCULIN PPD 5 UNIT/0.1ML ID SOLN
5.0000 [IU] | Freq: Once | INTRADERMAL | Status: AC
  Administered 2017-05-08: 5 [IU] via INTRADERMAL
  Filled 2017-05-08: qty 0.1

## 2017-05-08 MED ORDER — PROCHLORPERAZINE EDISYLATE 5 MG/ML IJ SOLN
2.5000 mg | Freq: Four times a day (QID) | INTRAMUSCULAR | Status: DC | PRN
  Filled 2017-05-08: qty 0.5

## 2017-05-08 MED ORDER — BISACODYL 10 MG RE SUPP
10.0000 mg | Freq: Every day | RECTAL | Status: DC | PRN
  Administered 2017-05-08: 10 mg via RECTAL
  Filled 2017-05-08: qty 1

## 2017-05-08 MED ORDER — PHYTONADIONE 1 MG/0.5 ML ORAL SOLUTION
3.0000 mg | Freq: Every day | ORAL | Status: DC
  Administered 2017-05-09 – 2017-05-10 (×2): 3 mg via ORAL
  Filled 2017-05-08 (×6): qty 1.5

## 2017-05-08 MED ORDER — PHYTONADIONE 1 MG/0.5 ML ORAL SOLUTION
2.0000 mg | Freq: Once | ORAL | Status: AC
  Administered 2017-05-08: 2 mg via ORAL
  Filled 2017-05-08: qty 1

## 2017-05-08 MED ORDER — AMIODARONE HCL 200 MG PO TABS
200.0000 mg | ORAL_TABLET | Freq: Two times a day (BID) | ORAL | Status: DC
  Administered 2017-05-08 – 2017-05-14 (×13): 200 mg via ORAL
  Filled 2017-05-08 (×13): qty 1

## 2017-05-08 NOTE — Progress Notes (Signed)
ANTICOAGULATION CONSULT NOTE - Initial Consult  Pharmacy Consult for Heparin Drip/Bridging   Indication: atrial fibrillation  Allergies  Allergen Reactions  . Penicillins Itching and Rash    Other reaction(s): RASH  . Enalapril Other (See Comments)    cough  . Statins Other (See Comments)    Other reaction(s): MUSCLE PAIN    Patient Measurements: Height: 5' 8.5" (174 cm) Weight: 147 lb 9.6 oz (67 kg) (entered in error... 140lbs is correct weight ) IBW/kg (Calculated) : 65.05 Vital Signs: Temp: 98.6 F (37 C) (07/24 0352) Temp Source: Oral (07/24 0352) BP: 97/62 (07/24 0352) Pulse Rate: 106 (07/24 0352)  Labs:  Recent Labs  05/06/17 0526 05/06/17 1757 04/22/2017 0348 05/08/17 0552  HGB 8.3* 9.3*  --   --   HCT 24.2* 27.2*  --   --   PLT 219 244  --   --   LABPROT 34.3*  --  30.5* 27.3*  INR 3.30  --  2.85 2.48  CREATININE  --  3.17* 3.50* 3.59*    Estimated Creatinine Clearance: 11.1 mL/min (A) (by C-G formula based on SCr of 3.59 mg/dL (H)).   Medical History: Past Medical History:  Diagnosis Date  . A-fib (HCC)    a. on Coumadin; b. CHADS2VASc => 5 (CHF, age x 2, vascular disease, female)  . Chronic systolic CHF (congestive heart failure) (HCC)   . Coronary artery disease    a. s/p CABG w/ SVG-PRDA  . S/P mitral valve clip implantation   . Status post aortic valve replacement     Assessment: 83 yo female with PMH of A.Fib, admitted with left femoral neck fracture secondary to fall. Patient takes warfarin, which has been placed on hold.  Pharmacy consulted for heparin dosing for bridge therapy for orthopedic surgery.   7/18 INR: 3.59 (Vit K 0.2 mg PO) 7/19 INR: 4.24 (Vit K 0.2 mg PO) 7/20 INR 3.29 (Vit K 1mg  PO) 7/21 INR 3.50 (Vit K 1mg  PO) 7/22 INR 3.30 (Vit K 1mg  PO) 7/23 INR 2.85 (Vit K 1 mg PO) 7/24: INR: 2.48    Goal of Therapy:  INR <2 Heparin level 0.3-0.7 units/ml Monitor platelets by anticoagulation protocol: Yes  Plan:  INR is remains  supratherapeutic for procedure. Will recheck with AM labs tomorrow. Once INR is less then 2, will initiate heparin 800u/hr.    Demetrius CharityJames,Chrishonda Hesch D, PharmD, BCPS Clinical Pharmacist 05/08/2017 10:08 AM

## 2017-05-08 NOTE — Progress Notes (Signed)
Pre hd info 

## 2017-05-08 NOTE — Progress Notes (Signed)
Patient Name: Cindy Weaver W Sarti Date of Encounter: 05/08/2017  Primary Cardiologist: [facility]  Hospital Problem List     Principal Problem:   Hip fracture [facility](HCC) Active Problems:   Atrial fibrillation with RVR (HCC)   Acute on chronic systolic CHF (congestive heart failure) (HCC)   Acute on chronic respiratory failure with hypoxia (HCC)   Anemia   Status post aortic valve replacement   Status post mitral valve repair   AKI (acute kidney injury) (HCC)   History of Coumadin therapy   Elevated troponin   Hypotension   Malnutrition of moderate degree     Subjective   Heart rate remains reasonably controlled in the upper 90s bpm, remains in Afib. On amiodarone infusion. Reports she "just doesn't feel well." No chest pain. INR improving, at 2.4. SCR remains up trending 3.17-->3.50-->3.57. Underwent vascular dialysis access on 7/23. Potassium trending up to 5.6. BP soft in the upper 90s systolic.   Inpatient Medications    Scheduled Meds: . albuterol  2.5 mg Nebulization BID  . amiodarone  200 mg Oral BID  . budesonide (PULMICORT) nebulizer solution  0.5 mg Nebulization BID  . docusate sodium  100 mg Oral BID  . feeding supplement (ENSURE ENLIVE)  237 mL Oral TID BM  . ferrous sulfate  325 mg Oral Q breakfast  . fluticasone  1 spray Each Nare Daily  . mometasone-formoterol  2 puff Inhalation BID  . multivitamin with minerals  1 tablet Oral Daily  . pantoprazole  40 mg Oral Daily  . phytonadione  1 mg Oral Daily  . polyethylene glycol  17 g Oral Daily  . predniSONE  20 mg Oral Q breakfast  . senna  1 tablet Oral BID  . tiotropium  18 mcg Inhalation Daily  . tuberculin  5 Units Intradermal Once   Continuous Infusions: . sodium chloride 60 mL/hr at 05/08/17 0300  . sodium chloride    . azithromycin Stopped (04/15/2017 1521)  . cefTRIAXone (ROCEPHIN) 1 g IVPB     PRN Meds: acetaminophen **OR** acetaminophen, albuterol, HYDROcodone-acetaminophen,  morphine injection, ondansetron **OR** ondansetron (ZOFRAN) IV, pneumococcal 23 valent vaccine, senna   Vital Signs    Vitals:   04/15/2017 2039 05/08/17 0009 05/08/17 0352 05/08/17 0400  BP:  (!) 93/54 97/62   Pulse:  (!) 103 (!) 106   Resp:  16 16   Temp:  97.9 F (36.6 C) 98.6 F (37 C)   TempSrc:  Oral Oral   SpO2: 97% 92% 95%   Weight:    147 lb 9.6 oz (67 kg)  Height:        Intake/Output Summary (Last 24 hours) at 05/08/17 [id number] Last data filed at 05/08/17 0700  Gross per 24 hour  Intake           1118.4 ml  Output              150 ml  Net            968.4 ml   Filed Weights   05/06/17 0359 04/27/2017 0500 05/08/17 0400  Weight: 140 lb 3.2 oz (63.6 kg) 140 lb 12.8 oz (63.9 kg) 147 lb 9.6 oz (67 kg)    Physical Exam    GEN: Well nourished, well developed, in no acute distress.  HEENT: Grossly normal.  Neck: Supple, no JVD, carotid bruits, or masses. Cardiac: Irregularly irregular, no murmurs, rubs, or gallops. No clubbing, cyanosis, edema.  Radials/DP/PT 2+ and  equal bilaterally.  Respiratory:  Respirations regular and unlabored, clear to auscultation bilaterally. GI: Soft, nontender, nondistended, BS + x 4. MS: no deformity or atrophy. Skin: warm and dry, no rash. Neuro:  Strength and sensation are intact. Psych: AAOx3.  Normal affect.  Labs    CBC  Recent Labs  05/06/17 0526 05/06/17 1757  WBC 10.9 10.5  HGB 8.3* 9.3*  HCT 24.2* 27.2*  MCV 95.8 96.4  PLT 219 244   Basic Metabolic Panel  Recent Labs  05/05/2017 0348 05/08/17 0552  NA 133* 128*  K 5.2* 5.6*  CL 93* 90*  CO2 26 23  GLUCOSE 137* 101*  BUN 81* 90*  CREATININE 3.50* 3.59*  CALCIUM 8.5* 8.5*   Liver Function Tests No results for input(s): AST, ALT, ALKPHOS, BILITOT, PROT, ALBUMIN in the last 72 hours. No results for input(s): LIPASE, AMYLASE in the last 72 hours. Cardiac Enzymes No results for input(s): CKTOTAL, CKMB, CKMBINDEX, TROPONINI in the last 72 hours. BNP Invalid  input(s): POCBNP D-Dimer No results for input(s): DDIMER in the last 72 hours. Hemoglobin A1C No results for input(s): HGBA1C in the last 72 hours. Fasting Lipid Panel No results for input(s): CHOL, HDL, LDLCALC, TRIG, CHOLHDL, LDLDIRECT in the last 72 hours. Thyroid Function Tests No results for input(s): TSH, T4TOTAL, T3FREE, THYROIDAB in the last 72 hours.  Invalid input(s): FREET3  Telemetry    Afib, 90s bpm - Personally Reviewed  ECG    n/a - Personally Reviewed  Radiology    US Renal  Result Date: 05/03/2017 IMPRESSION: 1. No hydronephrosis. 2. Diffuse cortical thinning in the right kidney. 3. Multiple small simple cysts in both kidneys. Electronically Signed   By: Trudie Reed M.D.   On: 05/09/2017 11:10    Cardiac Studies   TTE 2017/05/13: Study Conclusions  - Left ventricle: The cavity size was normal. There was mild   concentric hypertrophy. Systolic function was moderately reduced.   The estimated ejection fraction was in the range of 35% to 40%.   The study was not technically sufficient to allow evaluation of   LV diastolic dysfunction due to atrial fibrillation. - Aortic valve: A bioprosthesis was present and functioning   normally. - Mitral valve: A mitral valve clip was present. The findings are   consistent with mild stenosis. There was moderate regurgitation.   Mean gradient (D): 6 mm Hg. - Left atrium: The atrium was moderately to severely dilated. - Right atrium: The atrium was mildly dilated. - Tricuspid valve: There was moderate regurgitation. - Pulmonic valve: There was moderate regurgitation. - Pulmonary arteries: Systolic pressure was mildly to moderately   increased. PA peak pressure: 48 mm Hg (S).  Patient Profile     83 y.o. female with history of coronary artery disease status post CABG, valvular heart disease status post aortic valve replacement with bioprosthetic valve and mitral valve clinic for severe regurgitation, chronic  atrial fibrillation and multiple other chronic medical conditions who presented after a fall and was found to have hip fracture.  Assessment & Plan    1. Chronic Afib with RVR: -Heart rates improved in the 90s bpm, remains in Afib -BP soft which does not allow for addition of metoprolol  -Not an ideal candidate for CCB given her cardiomyopathy and soft BP -Not an ideal candidate for digoxin given her AKI with SCr 3.57 as of 7/24 -Transition IV amiodarone to PO amiodarone 200 mg bid -Hopefully, with the transition from IV amio to PO amio she will  have more BP room to allwo for slow addition of metoprolol for rate control and weaning of amiodarone -Unlikely to be a candidate for DCCV given the chronicity of her Afib -INR down trending for orthopedic surgery at a date to be determined   2. Acute on chronic systolic CHF: -Echo showed worsening ejection fraction of 35-40% -Cardiomyopathy likely 2/2 Afib with RVR -Will need adequate rate control followed by repeat echo in ~ 1 month -Lasix on hold given AKI -Metoprolol held as above -Status post vascular access for possible dialysis given worsening renal function and hyperkalemia  3. Elevated troponin: -Likely supply demand ischemia  4. Preoperative cardiac evaluation: -Given multiple cardiac issues, the patient is at least at moderate risk for surgery. We have to try to control her atrial fibrillation better before proceeding -INR is improving, though remains elevated  5. AKI: -Lasix on hold -As above  6. Anemia: -HGB remains low, though stable  7. Hip fracture: -Pain may be playing a role in her difficult to control ventricular rates  8. Hyperkalemia: -Status post vascular access for possible dialysis -Per IM/renal  Signed, Eula Listen, PA-C [facility] Pager: [phone] 05/08/2017, 8:22 AM

## 2017-05-08 NOTE — Progress Notes (Signed)
Pre hd assessment  

## 2017-05-08 NOTE — Progress Notes (Signed)
  End of hd 

## 2017-05-08 NOTE — Progress Notes (Signed)
[facility]  ROUNDING NOTE   Subjective:   Grandson at bedside.  UOP 150  Na 128, K 5.6, Creatinine 3.59(3.5) BUN 90 (81) INR 2.48  Right femoral HD catheter placed yesterday  Objective:  Vital signs in last 24 hours:  Temp:  [97.9 F (36.6 C)-98.6 F (37 C)] 98.6 F (37 C) (07/24 0352) Pulse Rate:  [55-106] 106 (07/24 0352) Resp:  [16-20] 16 (07/24 0352) BP: (93-108)/(45-62) 97/62 (07/24 0352) SpO2:  [92 %-99 %] 95 % (07/24 0752) Weight:  [67 kg (147 lb 9.6 oz)] 67 kg (147 lb 9.6 oz) (07/24 0400)  Weight change: 3.084 kg (6 lb 12.8 oz) Filed Weights   05/06/17 0359 04/22/2017 0500 05/08/17 0400  Weight: 63.6 kg (140 lb 3.2 oz) 63.9 kg (140 lb 12.8 oz) 67 kg (147 lb 9.6 oz)    Intake/Output: I/O last 3 completed shifts: In: 2284.7 [I.V.:2284.7] Out: 250 [Urine:250]   Intake/Output this shift:  No intake/output data recorded.  Physical Exam: General: NAD  Head: Normocephalic, atraumatic. Moist oral mucosal membranes  Eyes: Anicteric, PERRL  Neck: Supple, trachea midline  Lungs:  Clear to auscultation  Heart: irregular  Abdomen:  Soft, nontender  Extremities: no peripheral edema. Left hip in traction  Neurologic: Nonfocal, moving all four extremities  Skin: No lesions  Access: Right femoral temp HD cathter Dr. Lucky Cowboy 0/09    Basic Metabolic Panel:  Recent Labs Lab 04/15/2017 0954 05/03/17 0448 05/04/17 0600 05/05/17 0506 05/06/17 1757 04/26/2017 0348 05/08/17 0552  NA  --  134* 134* 135 131* 133* 128*  K  --  4.7 4.8 4.7 5.3* 5.2* 5.6*  CL  --  94* 93* 93* 93* 93* 90*  CO2  --  29 30 30 24 26 23   GLUCOSE  --  123* 117* 131* 140* 137* 101*  BUN  --  31* 44* 58* 72* 81* 90*  CREATININE  --  1.67* 2.24* 2.45* 3.17* 3.50* 3.59*  CALCIUM  --  8.5* 8.8* 8.5* 8.5* 8.5* 8.5*  MG 1.7 1.8  --   --   --   --   --     Liver Function Tests: No results for input(s): AST, ALT, ALKPHOS, BILITOT, PROT, ALBUMIN in the last 168 hours. No results for  input(s): LIPASE, AMYLASE in the last 168 hours. No results for input(s): AMMONIA in the last 168 hours.  CBC:  Recent Labs Lab 05/13/2017 0312 05/03/17 0448 05/04/17 0600 05/06/17 0526 05/06/17 1757  WBC 15.2* 12.8* 14.6* 10.9 10.5  HGB 9.7* 9.2* 9.4* 8.3* 9.3*  HCT 28.8* 27.0* 28.3* 24.2* 27.2*  MCV 97.6 94.9 95.3 95.8 96.4  PLT 316 266 257 219 244    Cardiac Enzymes:  Recent Labs Lab 05/13/2017 0312 04/16/2017 0954 05/13/2017 1523 04/27/2017 2117  TROPONINI 0.03* 0.13* 0.16* 0.16*    BNP: Invalid input(s): POCBNP  CBG: No results for input(s): GLUCAP in the last 168 hours.  Microbiology: Results for orders placed or performed during the hospital encounter of 04/16/2017  MRSA PCR Screening     Status: None   Collection Time: 05/04/17  8:27 AM  Result Value Ref Range Status   MRSA by PCR NEGATIVE NEGATIVE Final    Comment:        The GeneXpert MRSA Assay (FDA approved for NASAL specimens only), is one component of a comprehensive MRSA colonization surveillance program. It is not intended to diagnose MRSA infection nor to guide or monitor treatment for MRSA infections.   Culture, expectorated sputum-assessment  Status: None   Collection Time: 05/05/17 10:55 AM  Result Value Ref Range Status   Specimen Description SPUTUM  Final   Special Requests Normal  Final   Sputum evaluation THIS SPECIMEN IS ACCEPTABLE FOR SPUTUM CULTURE  Final   Report Status 05/05/2017 FINAL  Final  Culture, respiratory (NON-Expectorated)     Status: None   Collection Time: 05/05/17 10:55 AM  Result Value Ref Range Status   Specimen Description SPUTUM  Final   Special Requests Normal Reflexed from [id number]  Final   Gram Stain   Final    FEW WBC PRESENT, PREDOMINANTLY PMN FEW GRAM NEGATIVE RODS FEW GRAM POSITIVE COCCI IN PAIRS FEW GRAM POSITIVE RODS RARE BUDDING YEAST SEEN    Culture   Final    Consistent with normal respiratory flora. Performed at [facility], [place]  [address], [place], [place] [address]    Report Status 05/13/2017 FINAL  Final    Coagulation Studies:  Recent Labs  05/06/17 0526 05/09/2017 0348 05/08/17 0552  LABPROT 34.3* 30.5* 27.3*  INR 3.30 2.85 2.48    Urinalysis: No results for input(s): COLORURINE, LABSPEC, PHURINE, GLUCOSEU, HGBUR, BILIRUBINUR, KETONESUR, PROTEINUR, UROBILINOGEN, NITRITE, LEUKOCYTESUR in the last 72 hours.  Invalid input(s): APPERANCEUR    Imaging: US Renal  Result Date: 05/01/2017 CLINICAL DATA:  83 year old female with history of acute renal failure. EXAM: RENAL / URINARY TRACT ULTRASOUND COMPLETE COMPARISON:  None. FINDINGS: Right Kidney: Length: 9.7 cm. Echogenicity within normal limits. Severe cortical thinning. No hydronephrosis. Trace amount of perinephric fluid. Two small anechoic lesions with increased through transmission in the upper and interpolar regions, measuring up to 1.5 x 1.3 x 1.9 cm. Left Kidney: Length: 11.2 cm. Echogenicity within normal limits. In the upper pole there is a 5.2 x 3.4 x 5.3 cm anechoic lesion with increased through transmission, compatible with a simple cyst. No mass hydronephrosis. Bladder: Completely decompressed with a Foley balloon catheter in place. IMPRESSION: 1. No hydronephrosis. 2. Diffuse cortical thinning in the right kidney. 3. Multiple small simple cysts in both kidneys. Electronically Signed   By: Vinnie Langton M.D.   On: 05/15/2017 11:10     Medications:   . sodium chloride 60 mL/hr at 05/08/17 0300  . sodium chloride    . azithromycin Stopped (04/26/2017 1521)  . cefTRIAXone (ROCEPHIN) 1 g IVPB Stopped (05/08/17 0955)   . albuterol  2.5 mg Nebulization BID  . amiodarone  200 mg Oral BID  . budesonide (PULMICORT) nebulizer solution  0.5 mg Nebulization BID  . docusate sodium  100 mg Oral BID  . feeding supplement (ENSURE ENLIVE)  237 mL Oral TID BM  . ferrous sulfate  325 mg Oral Q breakfast  . fluticasone  1 spray Each Nare Daily  .  mometasone-formoterol  2 puff Inhalation BID  . multivitamin with minerals  1 tablet Oral Daily  . pantoprazole  40 mg Oral Daily  . phytonadione  1 mg Oral Daily  . polyethylene glycol  17 g Oral Daily  . predniSONE  20 mg Oral Q breakfast  . senna  1 tablet Oral BID  . tiotropium  18 mcg Inhalation Daily  . tuberculin  5 Units Intradermal Once   acetaminophen **OR** acetaminophen, albuterol, HYDROcodone-acetaminophen, morphine injection, ondansetron **OR** ondansetron (ZOFRAN) IV, pneumococcal 23 valent vaccine, senna  Assessment/ Plan:  Cindy Weaver is a 83 y.o. black female with Chronic kidney disease stage III baseline EGFR 52, congestive heart failure, hyperlipidemia, disease status post CABG,  anemia of chronic kidney disease, chronic atrial fibrillation, severe mitral regurgitation, hypertension, GERD, COPD, who was admitted to [facility] on 05/03/2017 for evaluation of Fall.  Patient developed left hip fracture and also had Atrial fibrillation with RVR and acute renal failure.  1. Acute renal failure on chronic kidney disease stage III: baseline creatinine 1.18, GFR of 52 from 04/01/17 Anuric to oliguric urine output.  BUN is higher, creatinine worsening. Hyponatremia, hyperkalemia - Continue IV fluids: NS at 38m/hr - Start hemodialysis. No UF.   2. Anemia of chronic kidney disease. Hemoglobin 9.3.  3. Hypotension with atrial fibrillation with rapid ventricular response:  - on amiodarone - Appreciate cardiology input. Monitor for exacerbation of congestive heart failure.   4. Acute exacerbation of COPD - on ceftriaxone and azithromycin. Nebulizer treatments and supportive care  5. Coagulopathy: INR 2.48 - Vitamin K   LOS: 6 Cindy Weaver 7/24/20189:56 AM

## 2017-05-08 NOTE — Consult Note (Signed)
Consultation Note Date: 05/08/2017   Patient Name: Cindy Weaver  DOB: 12/22/1925  MRN: [id number]  Age / Sex: 83 y.o., female  PCP: Hill, Tyro Referring Physician: Vaughan Basta, *  Reason for Consultation: Establishing goals of care  HPI/Patient Profile: 83 y.o. female  with past medical history of systolic CHF (EF 75%) on 2L at home, CAD, atrial fibrillation, s/p aortic valve replacement, s/p mitral valve clipping for severe mitral regurgitation admitted on 04/23/2017 with fall with left hip fracture. Hospital stay complicated by atrial fibrillation and heart failure which has delayed surgery to repair hip fracture. Atrial fibrillation controlled and now with worsening renal failure and beginning trial of dialysis 05/08/17.   Clinical Assessment and Goals of Care: I met today at Cindy Weaver bedside. She has her grandson and brother at bedside. They are very quiet during our conversation. Cindy Weaver appears very ill and miserable. She has wet cough, grimacing, and complaining of nausea and poor appetite. She has had minimal transient relief from nausea with Zofran - may have some improvement with dialysis (we discussed effects of renal failure). Her family is very quiet at bedside.   We discussed her hip fracture and the complications of this hospital stay. Cindy Weaver seems to have good understanding of the severity of illness and says "it's not good when your major organs are failing." She expresses hope for improvement but also tells me "everyone always hopes for improvement" but recognizes this is not always possible. She says she has not discussed with her family or anyone if she were to worsen. I did ask her thoughts regarding resuscitation and life support and she says she had not given this much thought but "I will leave that up to the doctors." I will like to discuss with her  daughters more.   For now we will continue plan for trial of dialysis and continue to follow.   Late Entry: I followed up this afternoon after Cindy Weaver has completed dialysis, which she did without complication. At bedside are her 2 daughters (from [place]) as well as her sister (retired Therapist, sports), nephew, and a family friend. Family is very frustrated with lack of progress and sister is very upset that her hip repair has been delayed. I explained that I understand their frustration but unfortunately she has had serious complications that have taken precedence and she needs to stabilize more before safe for surgery. They continue to hope for surgery soon.  I spoke more with patient and 2 daughters. They say they have been pleased with her care and communication and understand that her health status is very fragile. Their main concerns are a continued congested/wet cough that has been present prior to admission (she was told she had post-nasal drip, fluid overload, placed on antibiotics and also steroids). Denies SOB. Daughters say her cough improved briefly but unsure what helped this. Unclear to me the etiology of this cough as it does not seem to be improving with diuresis, antibiotics, steroids, nebs per family. Possibly  a new symptom that will be chronic (CHF). Also complaining of nausea with no relief from Zofran - will consider different antiemetic. They all continue to be hopeful for improvement and for hip repair. They also desire full code at this time.   I will continue to follow and support.   Primary Decision Maker PATIENT    SUMMARY OF RECOMMENDATIONS   - Patient is very ill with multiple health issues - Family especially struggling with her acute and significant decline (patient seems to understand the severity of her illness). Daughters seem to understand but very hopeful for improvement.   Code Status/Advance Care Planning:  DNR   Symptom Management:   Wet/congested cough: Per  primary, cardiology, renal. Consider guaifenesin. Pulmonary to consult.   Nausea: No relief from Zofran. May be complicated by constipation with LBM 7/17? (+BS and soft) Dulcolax supp ordered. May consider Reglan or compazine as well.   Palliative Prophylaxis:   Aspiration, Bowel Regimen, Delirium Protocol, Frequent Pain Assessment, Oral Care and Turn Reposition  Additional Recommendations (Limitations, Scope, Preferences):  Full Scope Treatment  Psycho-social/Spiritual:   Desire for further Chaplaincy support:no  Additional Recommendations: Caregiving  Support/Resources  Prognosis:   Unable to determine but poor given multiple severe comorbitities  Discharge Planning: To Be Determined      Primary Diagnoses: Present on Admission: . Atrial fibrillation with RVR ([place]) . Hip fracture ([place]) . Acute on chronic systolic CHF (congestive heart failure) ([place]) . Acute on chronic respiratory failure with hypoxia ([place]) . AKI (acute kidney injury) ([place]) . Elevated troponin . Anemia . Hypotension   I have reviewed the medical record, interviewed the patient and family, and examined the patient. The following aspects are pertinent.  Past Medical History:  Diagnosis Date  . A-fib ([place])    a. on Coumadin; b. CHADS2VASc => 5 (CHF, age x 2, vascular disease, female)  . Chronic systolic CHF (congestive heart failure) ([place])   . Coronary artery disease    a. s/p CABG w/ SVG-PRDA  . S/P mitral valve clip implantation   . Status post aortic valve replacement    Social History   Social History  . Marital status: Widowed    Spouse name: N/A  . Number of children: N/A  . Years of education: N/A   Social History Main Topics  . Smoking status: Former Smoker    Packs/day: 1.00    Years: 25.00    Quit date: 10/16/1993  . Smokeless tobacco: Former Systems developer  . Alcohol use No  . Drug use: No  . Sexual activity: Not Asked   Other Topics Concern  . None   Social History Narrative  .  None   Family History  Problem Relation Age of Onset  . CAD Mother    Scheduled Meds: . albuterol  2.5 mg Nebulization BID  . amiodarone  200 mg Oral BID  . budesonide (PULMICORT) nebulizer solution  0.5 mg Nebulization BID  . docusate sodium  100 mg Oral BID  . feeding supplement (ENSURE ENLIVE)  237 mL Oral TID BM  . ferrous sulfate  325 mg Oral Q breakfast  . fluticasone  1 spray Each Nare Daily  . mometasone-formoterol  2 puff Inhalation BID  . multivitamin with minerals  1 tablet Oral Daily  . pantoprazole  40 mg Oral Daily  . phytonadione  1 mg Oral Daily  . polyethylene glycol  17 g Oral Daily  . predniSONE  20 mg Oral Q breakfast  . senna  1  tablet Oral BID  . tiotropium  18 mcg Inhalation Daily  . tuberculin  5 Units Intradermal Once   Continuous Infusions: . sodium chloride 60 mL/hr at 05/08/17 0300  . sodium chloride    . azithromycin Stopped (04/22/2017 1521)  . cefTRIAXone (ROCEPHIN) 1 g IVPB Stopped (05/08/17 0955)   PRN Meds:.acetaminophen **OR** acetaminophen, albuterol, HYDROcodone-acetaminophen, morphine injection, ondansetron **OR** ondansetron (ZOFRAN) IV, pneumococcal 23 valent vaccine, senna Allergies  Allergen Reactions  . Penicillins Itching and Rash    Other reaction(s): RASH  . Enalapril Other (See Comments)    cough  . Statins Other (See Comments)    Other reaction(s): MUSCLE PAIN   Review of Systems  Constitutional: Positive for activity change, appetite change and fatigue.  Respiratory: Positive for cough. Negative for shortness of breath.   Gastrointestinal: Positive for constipation. Negative for vomiting.  Neurological: Positive for weakness.    Physical Exam  Constitutional: She is oriented to person, place, and time. She appears well-developed.  HENT:  Head: Normocephalic and atraumatic.  Cardiovascular: An irregularly irregular rhythm present. Tachycardia present.   Pulmonary/Chest: Effort normal. No accessory muscle usage. No  tachypnea. No respiratory distress.  Productive cough  Abdominal: Soft. Normal appearance.  Neurological: She is alert and oriented to person, place, and time.  She is tired and restless  Nursing note and vitals reviewed.   Vital Signs: BP 97/62 (BP Location: Left Arm)   Pulse (!) 106   Temp 98.6 F (37 C) (Oral)   Resp 16   Ht 5' 8.5" (1.74 m)   Wt 67 kg (147 lb 9.6 oz) Comment: entered in error... 140lbs is correct weight   SpO2 95%   BMI 22.12 kg/m  Pain Assessment: No/denies pain   Pain Score: 5    SpO2: SpO2: 95 % O2 Device:SpO2: 95 % O2 Flow Rate: .O2 Flow Rate (L/min): 2 L/min  IO: Intake/output summary:  Intake/Output Summary (Last 24 hours) at 05/08/17 1010 Last data filed at 05/08/17 0700  Gross per 24 hour  Intake           1118.4 ml  Output              150 ml  Net            968.4 ml    LBM: Last BM Date: 05/01/17 Baseline Weight: Weight: 56.2 kg (124 lb) Most recent weight: Weight: 67 kg (147 lb 9.6 oz) (entered in error... 140lbs is correct weight )     Palliative Assessment/Data: 20%   Flowsheet Rows     Most Recent Value  Intake Tab  Referral Department  Hospitalist  Unit at Time of Referral  Cardiac/Telemetry Unit  Palliative Care Primary Diagnosis  Cardiac  Date Notified  05/08/2017  Palliative Care Type  New Palliative care  Reason for referral  Clarify Goals of Care  Date of Admission  04/26/2017  # of days IP prior to Palliative referral  5  Clinical Assessment  Psychosocial & Spiritual Assessment  Palliative Care Outcomes      Time Total: 61mn  Greater than 50%  of this time was spent counseling and coordinating care related to the above assessment and plan.  Signed by: AVinie Sill NP Palliative Medicine Team Pager # [phone](M-F 8a-5p) Team Phone # [phone](Nights/Weekends)

## 2017-05-08 NOTE — Progress Notes (Signed)
PHARMACIST - PHYSICIAN COMMUNICATION DR:   Elisabeth PigeonVachhani CONCERNING: Antibiotic IV to Oral Route Change Policy  RECOMMENDATION: This patient is receiving Azithromycin by the intravenous route.  Based on criteria approved by the Pharmacy and Therapeutics Committee, the antibiotic(s) is/are being converted to the equivalent oral dose form(s).   DESCRIPTION: These criteria include:  Patient being treated for a respiratory tract infection, urinary tract infection, cellulitis or clostridium difficile associated diarrhea if on metronidazole  The patient is not neutropenic and does not exhibit a GI malabsorption state  The patient is eating (either orally or via tube) and/or has been taking other orally administered medications for a least 24 hours  The patient is improving clinically and has a Tmax < 100.5  If you have questions about this conversion, please contact the Pharmacy Department  []   [phone]( [phone] )  [facility] [x]   [phone]( [phone] )  [facility] []   [phone]( [phone] )  [facility] []   [phone]( [phone] )  [facility] []   [phone]( [phone] )  [facility]

## 2017-05-08 NOTE — Progress Notes (Signed)
Nutrition Follow-up  DOCUMENTATION CODES:   Non-severe (moderate) malnutrition in context of chronic illness  INTERVENTION:  1. Ensure Enlive po TID, each supplement provides 350 kcal and 20 grams of protein 2. Multivitamin w/ Minerals  NUTRITION DIAGNOSIS:   Malnutrition related to  (heart disease and advanced age ) as evidenced by severe depletion of muscle mass, percent weight loss. -ongoing  GOAL:   Patient will meet greater than or equal to 90% of their needs -progressing  MONITOR:   PO intake, Supplement acceptance, Labs, Weight trends  ASSESSMENT:   83 year old female with multiple medical problems including A. fib on Coumadin, CAD status post CABG, mitral valve clipping for severe MR, bioprosthetic aortic valve replacement surgery, chronic respiratory failure on 2 L home oxygen, Systolic CHF with EF of 45% presents to the hospital secondary to fall and noted to have left hip fracture.  With AKI on CKD III, temporary R Femoral HD Cath placed yesterday, now on HD Dialyzed today. Patient continues to eat poorly per daughters and patient. She complains that chewing tires her. Has been consuming ensure daily. Also complains of nausea "as soon as food hits my stomach." on PRN zofran. Encouraged her to continue to ask for nausea medication especially around meals. Provided education to daughters on how to help patient meet her calorie needs as an outpatient with liquids and softer foods. Will continue to monitor for needs.  Weight up to 161lb this afternoon from 147lb this morning, question accuracy will monitor weight trends. 3.5L fluid positive (500mL pulled during HD)  Intake/Output Summary (Last 24 hours) at 05/08/17 1300 Last data filed at 05/08/17 1245  Gross per 24 hour  Intake           1118.4 ml  Output              650 ml  Net            468.4 ml  Labs reviewed:  Na 128, K 5.6, BUN/Creatinine 90/3.59 Medications reviewed and include:  Colace, Iron, Miralax,  Prednisone, Senokot NS @ 3360mL/hr PRN Zofran Meal Completion: 80-100%  Diet Order:  Diet regular Room service appropriate? Yes; Fluid consistency: Thin  Skin:  Reviewed, no issues  Last BM:  7/17  Height:   Ht Readings from Last 1 Encounters:  04/28/2017 5' 8.5" (1.74 m)    Weight:   Wt Readings from Last 1 Encounters:  05/08/17 147 lb 9.6 oz (67 kg)    Ideal Body Weight:  64.7 kg  BMI:  Body mass index is 22.12 kg/m.  Estimated Nutritional Needs:   Kcal:  1600-1800kcal/day   Protein:  73-84g/day   Fluid:  >1.6L/day   EDUCATION NEEDS:   Education needs addressed  Dionne AnoWilliam M. Cloria Ciresi, MS, RD LDN Inpatient Clinical Dietitian Pager [phone]

## 2017-05-08 NOTE — Progress Notes (Signed)
Hd start, pt alert, no c/o, stable, education complete, first hd tx

## 2017-05-08 NOTE — Progress Notes (Signed)
Post hd assessment unchanged  

## 2017-05-08 NOTE — Progress Notes (Signed)
[facility] [place] [facility]   PATIENT NAME: Cindy Weaver    MR#:  [id number]  DATE OF BIRTH:  1926-06-23  SUBJECTIVE:  CHIEF COMPLAINT:   Chief Complaint  Patient presents with  . Fall  . Cough   -  No fevers. Pain better controlled. Requiring less amounts of oxygen today. -Heart rate is controlled, IV amiodarone and switched to oral. INR still remains more than 2 but gradually coming down. Renal function continued to get worse so finally started on hemodialysis today   REVIEW OF SYSTEMS:  Review of Systems  Constitutional: Positive for malaise/fatigue. Negative for chills and fever.  HENT: Negative for ear discharge, hearing loss, nosebleeds and sore throat.   Eyes: Negative for blurred vision and double vision.  Respiratory: Positive for cough. Negative for shortness of breath and wheezing.   Cardiovascular: Negative for chest pain, palpitations and leg swelling.  Gastrointestinal: Positive for constipation. Negative for abdominal pain, diarrhea, nausea and vomiting.  Genitourinary: Negative for dysuria.  Musculoskeletal: Positive for joint pain and myalgias.  Neurological: Negative for dizziness and seizures.    DRUG ALLERGIES:   Allergies  Allergen Reactions  . Penicillins Itching and Rash    Other reaction(s): RASH  . Enalapril Other (See Comments)    cough  . Statins Other (See Comments)    Other reaction(s): MUSCLE PAIN    VITALS:  Blood pressure (!) 103/58, pulse 95, temperature 98 F (36.7 C), temperature source Oral, resp. rate 18, height 5' 8.5" (1.74 m), weight 73.1 kg (161 lb 1.6 oz), SpO2 96 %.  PHYSICAL EXAMINATION:  Physical Exam  GENERAL:  83 y.o.-year-old patient lying in the bed with no acute distress. Appears uncomfortable in bed. Denies any pain EYES: Pupils equal, round, reactive to light and accommodation. No scleral icterus. Extraocular muscles intact.  HEENT: Head atraumatic, normocephalic. Oropharynx and  nasopharynx clear.  NECK:  Supple, no jugular venous distention. No thyroid enlargement, no tenderness.  LUNGS: Normal breath sounds bilaterally, no wheezing, some crepitation. No use of accessory muscles of respiration. Rhonchi at the Lung bases, left greater than right. CARDIOVASCULAR: S1, S2 normal. No  rubs, or gallops. Loud 4/6 systolic murmur heard ABDOMEN: Soft, nontender, nondistended. Bowel sounds present. No organomegaly or mass.  EXTREMITIES: No pedal edema, cyanosis, or clubbing. Left leg in traction NEUROLOGIC: Cranial nerves II through XII are intact. Muscle strength 4/5 in all extremities except left leg due to pain from fracture and being on traction. Sensation intact. Gait not checked.  PSYCHIATRIC: The patient is alert and oriented x 3.  SKIN: No obvious rash, lesion, or ulcer.    LABORATORY PANEL:   CBC  Recent Labs Lab 05/08/17 1058  WBC 8.5  HGB 8.3*  HCT 24.3*  PLT 230   ------------------------------------------------------------------------------------------------------------------  Chemistries   Recent Labs Lab 05/03/17 0448  05/08/17 0552  NA 134*  < > 128*  K 4.7  < > 5.6*  CL 94*  < > 90*  CO2 29  < > 23  GLUCOSE 123*  < > 101*  BUN 31*  < > 90*  CREATININE 1.67*  < > 3.59*  CALCIUM 8.5*  < > 8.5*  MG 1.8  --   --   < > = values in this interval not displayed. ------------------------------------------------------------------------------------------------------------------  Cardiac Enzymes  Recent Labs Lab 04/25/2017 2117  TROPONINI 0.16*   ------------------------------------------------------------------------------------------------------------------  RADIOLOGY:  US Renal  Result Date: 05/06/2017 CLINICAL DATA:  83 year old female with history of acute  renal failure. EXAM: RENAL / URINARY TRACT ULTRASOUND COMPLETE COMPARISON:  None. FINDINGS: Right Kidney: Length: 9.7 cm. Echogenicity within normal limits. Severe cortical thinning.  No hydronephrosis. Trace amount of perinephric fluid. Two small anechoic lesions with increased through transmission in the upper and interpolar regions, measuring up to 1.5 x 1.3 x 1.9 cm. Left Kidney: Length: 11.2 cm. Echogenicity within normal limits. In the upper pole there is a 5.2 x 3.4 x 5.3 cm anechoic lesion with increased through transmission, compatible with a simple cyst. No mass hydronephrosis. Bladder: Completely decompressed with a Foley balloon catheter in place. IMPRESSION: 1. No hydronephrosis. 2. Diffuse cortical thinning in the right kidney. 3. Multiple small simple cysts in both kidneys. Electronically Signed   By: Trudie Reed M.D.   On: 24-May-2017 11:10    EKG:   Orders placed or performed during the hospital encounter of 05/11/2017  . ED EKG  . ED EKG    ASSESSMENT AND PLAN:   83 year old female with multiple medical problems including A. fib on Coumadin, CAD status post CABG, mitral valve clipping for severe MR, bioprosthetic aortic valve replacement surgery, chronic respiratory failure on 2 L home oxygen, Systolic CHF with EF of 45% presents to the hospital secondary to fall and noted to have left hip fracture.  #1 atrial fibrillation with rapid ventricular response-likely triggered by her acute systolic failure and hypoxia - Initially given amiodarone drip- switched to oral amio. started on oral metoprolol. -Echocardiogram with EF of 35-40%. -Appreciate cardiology consult. not a candidate for digoxin due to renal failure. -Due to her surgery, Coumadin on hold.  - amiodarone. HR is under control.   We are giving oral vitamin K 1 mg daily for last 5 days, INR is coming down but still more than 2. I will increase the dose of vitamin K, and once INR is less than 2 Will start on heparin drip until she finishes the surgery and able to restart the Coumadin.  #2 acute on chronic systolic CHF- received IV Lasix on admission. -Due to worsening renal failure, will hold Lasix  . - Appreciate cardiology consult - now on gentle hydration for renal failure.  #3 acute on chronic respiratory failure with hypoxia-secondary to systolic CHF-acute and chronic. EF- 35% -also significant pneumonitis noted on chest x-ray. MRSA PCR is negative. Was on cefepime. Switch to rocephin now. We may stop after total 7 days of treatment. -Continue to wean oxygen as tolerated. At home she is only on 2 L. - requested pulmonary consult as well- since she will need surgery. -Continue nebulizers.   Encouraged to use incentive spirometry.  #4 acute renal failure-Baseline creatinine seems to be between 1.2-1.3 - Gradually getting worse. avoid nephrotoxins - ATn from hypotension and also diuresis, gentle hydration today - gradual worsening- called nephro consult. - Cont hydration. Does not have much urine output.  - nephro started on HD 05/08/17.  #5 left hip fracture-secondary to mechanical fall. Ortho has been consulted. -Patient not ready for surgery yet. HR is stable now.  -Coumadin  On hold, INR still > 2 - on Vit k oral . Continue pain medications for pain control. - will start heparin IV once INR < 2.  #6 DVT prophylaxis - will need surgery and INR is therapeutic, on daily  vitamin K  #7 constipation    Added miralax and senna. Poor oral intake.  All the records are reviewed and case discussed with Care Management/Social Worker. Management plans discussed with the patient, family and  they are in agreement.  CODE STATUS: Full code  TOTAL TIME TAKING CARE OF THIS PATIENT: 35 minutes.   POSSIBLE D/C IN 3-4 DAYS, DEPENDING ON CLINICAL CONDITION.   Altamese DillingVACHHANI, Zarin Knupp M.D on 05/08/2017 at 4:59 PM  Between 7am to 6pm - Pager - [phone]  After 6pm go to [email or web address] - Social research officer, governmentpassword [email or web address] [email or web address]  [facility] [place] [facility]  Office  [phone]  CC: Primary care physician; Elita QuickHill, [facility]

## 2017-05-09 ENCOUNTER — Inpatient Hospital Stay: Payer: Medicare Other

## 2017-05-09 DIAGNOSIS — R059 Cough, unspecified: Secondary | ICD-10-CM

## 2017-05-09 DIAGNOSIS — R112 Nausea with vomiting, unspecified: Secondary | ICD-10-CM

## 2017-05-09 DIAGNOSIS — R05 Cough: Secondary | ICD-10-CM

## 2017-05-09 LAB — COMPREHENSIVE METABOLIC PANEL
ALT: 15 U/L (ref 14–54)
AST: 23 U/L (ref 15–41)
Albumin: 2.3 g/dL — ABNORMAL LOW (ref 3.5–5.0)
Alkaline Phosphatase: 73 U/L (ref 38–126)
Anion gap: 13 (ref 5–15)
BILIRUBIN TOTAL: 0.7 mg/dL (ref 0.3–1.2)
BUN: 73 mg/dL — AB (ref 6–20)
CO2: 26 mmol/L (ref 22–32)
CREATININE: 3.07 mg/dL — AB (ref 0.44–1.00)
Calcium: 8.3 mg/dL — ABNORMAL LOW (ref 8.9–10.3)
Chloride: 95 mmol/L — ABNORMAL LOW (ref 101–111)
GFR calc Af Amer: 15 mL/min — ABNORMAL LOW (ref >60)
GFR, EST NON AFRICAN AMERICAN: 13 mL/min — AB (ref >60)
Glucose, Bld: 94 mg/dL (ref 65–99)
POTASSIUM: 4.7 mmol/L (ref 3.5–5.1)
Sodium: 134 mmol/L — ABNORMAL LOW (ref 135–145)
Total Protein: 5.5 g/dL — ABNORMAL LOW (ref 6.5–8.1)

## 2017-05-09 LAB — APTT: aPTT: 41 seconds — ABNORMAL HIGH (ref 24–36)

## 2017-05-09 LAB — PROTIME-INR
INR: 1.63
PROTHROMBIN TIME: 19.5 s — AB (ref 11.4–15.2)

## 2017-05-09 LAB — HEPATITIS B SURFACE ANTIBODY,QUALITATIVE: HEP B S AB: NONREACTIVE

## 2017-05-09 LAB — HEPARIN LEVEL (UNFRACTIONATED): HEPARIN UNFRACTIONATED: 0.25 [IU]/mL — AB (ref 0.30–0.70)

## 2017-05-09 LAB — HEPATITIS B SURFACE ANTIGEN: HEP B S AG: NEGATIVE

## 2017-05-09 LAB — HEPATITIS B CORE ANTIBODY, TOTAL: HEP B C TOTAL AB: NEGATIVE

## 2017-05-09 LAB — HEPATITIS C ANTIBODY: HCV Ab: <0.1 s/co ratio (ref 0.0–0.9)

## 2017-05-09 MED ORDER — HEPARIN BOLUS VIA INFUSION
1000.0000 [IU] | Freq: Once | INTRAVENOUS | Status: AC
  Administered 2017-05-09: 1000 [IU] via INTRAVENOUS
  Filled 2017-05-09: qty 1000

## 2017-05-09 MED ORDER — HEPARIN (PORCINE) IN NACL 100-0.45 UNIT/ML-% IJ SOLN
1150.0000 [IU]/h | INTRAMUSCULAR | Status: DC
  Administered 2017-05-09: 800 [IU]/h via INTRAVENOUS
  Administered 2017-05-10: 1150 [IU]/h via INTRAVENOUS
  Filled 2017-05-09 (×2): qty 250

## 2017-05-09 MED ORDER — METOCLOPRAMIDE HCL 5 MG/ML IJ SOLN: 5.0000 mg | Freq: Three times a day (TID) | INTRAMUSCULAR | Status: DC | PRN

## 2017-05-09 MED ORDER — ALBUTEROL SULFATE (2.5 MG/3ML) 0.083% IN NEBU
2.5000 mg | INHALATION_SOLUTION | RESPIRATORY_TRACT | Status: DC
  Administered 2017-05-09 – 2017-05-13 (×19): 2.5 mg via RESPIRATORY_TRACT
  Filled 2017-05-09 (×23): qty 3

## 2017-05-09 MED ORDER — GUAIFENESIN ER 600 MG PO TB12
1200.0000 mg | ORAL_TABLET | Freq: Two times a day (BID) | ORAL | Status: DC
  Administered 2017-05-09 – 2017-05-13 (×9): 1200 mg via ORAL
  Filled 2017-05-09 (×9): qty 2

## 2017-05-09 NOTE — Progress Notes (Signed)
Post HD assessment unchanged  

## 2017-05-09 NOTE — Progress Notes (Signed)
ANTICOAGULATION CONSULT NOTE - Initial Consult  Pharmacy Consult for Heparin Drip/Bridging   Indication: atrial fibrillation  Allergies  Allergen Reactions  . Penicillins Itching and Rash    Other reaction(s): RASH  . Enalapril Other (See Comments)    cough  . Statins Other (See Comments)    Other reaction(s): MUSCLE PAIN    Patient Measurements: Height: 5' 8.5" (174 cm) Weight: 148 lb 12.8 oz (67.5 kg) IBW/kg (Calculated) : 65.05 Vital Signs: Temp: 98.1 F (36.7 C) (07/25 0502) Temp Source: Oral (07/25 0502) BP: 110/50 (07/25 0502) Pulse Rate: 49 (07/25 0502)  Labs:  Recent Labs  05/06/17 1757 05/06/2017 0348 05/08/17 0552 05/08/17 1058 05/09/17 0655  HGB 9.3*  --   --  8.3*  --   HCT 27.2*  --   --  24.3*  --   PLT 244  --   --  230  --   LABPROT  --  30.5* 27.3*  --  19.5*  INR  --  2.85 2.48  --  1.63  CREATININE 3.17* 3.50* 3.59*  --  3.07*    Estimated Creatinine Clearance: 13 mL/min (A) (by C-G formula based on SCr of 3.07 mg/dL (H)).   Medical History: Past Medical History:  Diagnosis Date  . A-fib (H[facility])    a. on Coumadin; b. CHADS2VASc => 5 (CHF, age x 2, vascular disease, female)  . Chronic systolic CHF (congestive heart failure) (H[facility])   . Coronary artery disease    a. s/p CABG w/ SVG-PRDA  . S/P mitral valve clip implantation   . Status post aortic valve replacement     Assessment: 83 yo female with PMH of A.Fib, admitted with left femoral neck fracture secondary to fall. Patient takes warfarin, which has been placed on hold.  Pharmacy consulted for heparin dosing for bridge therapy for orthopedic surgery.   7/18 INR: 3.59 (Vit K 0.2 mg PO) 7/19 INR: 4.24 (Vit K 0.2 mg PO) 7/20 INR 3.29 (Vit K 1mg  PO) 7/21 INR 3.50 (Vit K 1mg  PO) 7/22 INR 3.30 (Vit K 1mg  PO) 7/23 INR 2.85 (Vit K 1 mg PO) 7/24: INR: 2.48  (Vit K 2.5 mg PO) 7/25: INR: 1.63   Goal of Therapy:  INR <2 Heparin level 0.3-0.7 units/ml Monitor platelets by anticoagulation  protocol: Yes  Plan:  Spoke with MD Elisabeth PigeonVachhani and he would like to start Heparin gtt since INR is now < 2. Will initiate Heparin gtt @ 800 units/hr. 1st Heparin level to be drawn @ 1630 on 7/25.   Demetrius CharityJames,Eloyse Causey D, PharmD, BCPS Clinical Pharmacist 05/09/2017 8:07 AM

## 2017-05-09 NOTE — Progress Notes (Signed)
Daily Progress Note   Patient Name: Cindy Weaver       Date: 05/09/2017 DOB: 1926/06/13  Age: 83 y.o. MRN#: [id number] Attending Physician: Cindy Weaver, * Primary Care Physician: Cindy Weaver Date: 05/05/2017  Reason for Consultation/Follow-up: Establishing goals of care  Subjective: Cindy Weaver continues to complain of nausea, congested cough, weakness, and insomnia.   Length of Stay: 7  Current Medications: Scheduled Meds:  . albuterol  2.5 mg Nebulization Q4H  . amiodarone  200 mg Oral BID  . azithromycin  250 mg Oral Daily  . budesonide (PULMICORT) nebulizer solution  0.5 mg Nebulization BID  . docusate sodium  100 mg Oral BID  . feeding supplement (ENSURE ENLIVE)  237 mL Oral TID BM  . ferrous sulfate  325 mg Oral Q breakfast  . fluticasone  1 spray Each Nare Daily  . guaiFENesin  1,200 mg Oral BID  . mometasone-formoterol  2 puff Inhalation BID  . multivitamin with minerals  1 tablet Oral Daily  . pantoprazole  40 mg Oral Daily  . phytonadione  3 mg Oral Daily  . polyethylene glycol  17 g Oral Daily  . predniSONE  20 mg Oral Q breakfast  . senna  1 tablet Oral BID  . tiotropium  18 mcg Inhalation Daily  . tuberculin  5 Units Intradermal Once    Continuous Infusions: . sodium chloride 60 mL/hr at 05/08/17 2318  . sodium chloride    . cefTRIAXone (ROCEPHIN) 1 g IVPB Stopped (05/09/17 0849)  . heparin 800 Units/hr (05/09/17 0828)    PRN Meds: acetaminophen **OR** acetaminophen, bisacodyl, HYDROcodone-acetaminophen, metoCLOPramide (REGLAN) injection, morphine injection, ondansetron **OR** ondansetron (ZOFRAN) IV, pneumococcal 23 valent vaccine, senna  Physical Exam         Constitutional: She is oriented to person, place, and time.  She appears well-developed.  Head: Normocephalic and atraumatic.  Cardiovascular: An irregularly irregular rhythm present. Tachycardia present.   Pulmonary/Chest: Effort normal. No accessory muscle usage. No tachypnea. Mild-mod respiratory distress.  Productive cough, Rhonchi Abdominal: Soft. Normal appearance.  Neurological: She is alert and oriented to person, place, and time.  She is tired and restless  Nursing note and vitals reviewed.  Vital Signs: BP (!) 93/52   Pulse (!) 101   Temp 98.1 F (36.7 C) (Oral)  Resp 18   Ht 5' 8.5" (1.74 m)   Wt 67.5 kg (148 lb 12.8 oz)   SpO2 93%   BMI 22.30 kg/m  SpO2: SpO2: 93 % O2 Device: O2 Device: Not Delivered O2 Flow Rate: O2 Flow Rate (L/min): 2 L/min  Intake/output summary:  Intake/Output Summary (Last 24 hours) at 05/09/17 1244 Last data filed at 05/09/17 0900  Gross per 24 hour  Intake              462 ml  Output              750 ml  Net             -288 ml   LBM: Last BM Date: 05/08/17 Baseline Weight: Weight: 56.2 kg (124 lb) Most recent weight: Weight: 67.5 kg (148 lb 12.8 oz)       Palliative Assessment/Data:    Flowsheet Rows     Most Recent Value  Intake Tab  Referral Department  Hospitalist  Unit at Time of Referral  Cardiac/Telemetry Unit  Palliative Care Primary Diagnosis  Cardiac  Date Notified  05/04/2017  Palliative Care Type  New Palliative care  Reason for referral  Clarify Goals of Care  Date of Admission  05/01/2017  # of days IP prior to Palliative referral  5  Clinical Assessment  Psychosocial & Spiritual Assessment  Palliative Care Outcomes      Patient Active Problem List   Diagnosis Date Noted  . Acute renal failure (ARF) ([place])   . Goals of care, counseling/discussion   . Palliative care encounter   . Malnutrition of moderate degree 05/04/2017  . Atrial fibrillation with RVR ([place]) 04/16/2017  . Hip fracture ([place]) 04/30/2017  . Acute on chronic systolic CHF (congestive heart failure)  ([place]) 04/26/2017  . Acute on chronic respiratory failure with hypoxia ([place]) 04/17/2017  . Status post aortic valve replacement 05/01/2017  . Status post mitral valve repair 05/12/2017  . AKI (acute kidney injury) ([place]) 05/13/2017  . History of Coumadin therapy 04/16/2017  . Elevated troponin 05/10/2017  . Hypotension 05/01/2017  . A-fib ([place]) 06/17/2015  . Dental disease 06/17/2015  . MI (mitral incompetence) 04/25/2015  . Bursitis of knee 10/19/2014  . Anemia 02/18/2014  . Positional vertigo 12/15/2013  . CCF (congestive cardiac failure) ([place]) 05/22/2008    Palliative Care Assessment & Plan   HPI: 83 y.o. female  with past medical history of systolic CHF (EF 75%) on 2L at home, CAD, atrial fibrillation, s/p aortic valve replacement, s/p mitral valve clipping for severe mitral regurgitation admitted on 04/22/2017 with fall with left hip fracture. Hospital stay complicated by atrial fibrillation and heart failure which has delayed surgery to repair hip fracture. Atrial fibrillation controlled and now with worsening renal failure and beginning trial of dialysis 05/08/17.    Assessment: I met again at Cindy Weaver bedside along with Cindy Weaver and Cindy Weaver (joined by Cindy Weaver). Plan for surgery is for Friday currently and they are happy to hear this plan. She just had CXR to f/u for need for dialysis and with wet cough and will be seen by SLP as well. Cindy Weaver will add Reglan for nausea. Gave Cindy a cool cloth for Cindy forehead, RN to connect yankeur as she is struggling to spit mucous.   Spent time reassuring patient and family, they are very appreciative of the medical team caring for Cindy. They continue to be hopeful for continued improvement especially after hip repair.  Recommendations/Plan:  Wet/congested cough: PXCR - may need more dialysis today. She has now agreed to guaifenesin as well. Nebs increased. Encourage IS, flutter valve, deep breathes, turning. SLP to see.    Nausea: Continue Zofran prn. Reglan added.   Insomnia: If not improved with treatment of other symptoms may consider trazodone 25 mg qhs.   Goals of Care and Additional Recommendations:  Limitations on Scope of Treatment: Full Scope Treatment  Code Status:  Full code  Prognosis:   Unable to determine but poor given multiple severe comorbitities   Discharge Planning:  To Be Determined   Thank you for allowing the Palliative Medicine Team to assist in the care of this patient.   Total Time 1mn Prolonged Time Billed  no       Greater than 50%  of this time was spent counseling and coordinating care related to the above assessment and plan.  AVinie Sill NP Palliative Medicine Team Pager # [phone](M-F 8a-5p) Team Phone # [phone](Nights/Weekends)

## 2017-05-09 NOTE — Progress Notes (Signed)
[facility]  ROUNDING NOTE   Subjective:   Daughter at bedside.  UOP 250  Hemodialysis treatment yesterday. First treatment. For hyperkalemia, hyponatremia and uremic symptoms.  Tolerated treatment well.   NS at 25m/hr  Patient complains of productive cough and wheezing.   Objective:  Vital signs in last 24 hours:  Temp:  [98 F (36.7 C)-98.6 F (37 C)] 98.1 F (36.7 C) (07/25 0502) Pulse Rate:  [49-110] 101 (07/25 0725) Resp:  [15-20] 18 (07/25 0725) BP: (93-118)/(50-68) 93/52 (07/25 0725) SpO2:  [92 %-100 %] 93 % (07/25 0757) Weight:  [67.5 kg (148 lb 12.8 oz)-73.1 kg (161 lb 1.6 oz)] 67.5 kg (148 lb 12.8 oz) (07/25 0502)  Weight change: 6.124 kg (13 lb 8 oz) Filed Weights   05/08/17 0400 05/08/17 1322 05/09/17 0502  Weight: 67 kg (147 lb 9.6 oz) 73.1 kg (161 lb 1.6 oz) 67.5 kg (148 lb 12.8 oz)    Intake/Output: I/O last 3 completed shifts: In: 1580.4 [I.V.:1580.4] Out: 900 [Urine:400; Other:500]   Intake/Output this shift:  No intake/output data recorded.  Physical Exam: General: NAD  Head: Normocephalic, atraumatic. Moist oral mucosal membranes  Eyes: Anicteric, PERRL  Neck: Supple, trachea midline  Lungs:  +wheezing  Heart: irregular  Abdomen:  Soft, nontender  Extremities: no peripheral edema. Left hip in traction  Neurologic: Nonfocal, moving all four extremities  Skin: No lesions  Access: Right femoral temp HD cathter Dr. DLucky Cowboy78/00   Basic Metabolic Panel:  Recent Labs Lab 05/03/17 0448  05/05/17 0349107/22/18 1757 05/13/2017 0348 05/08/17 0552 05/09/17 0655  NA 134*  < > 135 131* 133* 128* 134*  K 4.7  < > 4.7 5.3* 5.2* 5.6* 4.7  CL 94*  < > 93* 93* 93* 90* 95*  CO2 29  < > 30 24 26 23 26   GLUCOSE 123*  < > 131* 140* 137* 101* 94  BUN 31*  < > 58* 72* 81* 90* 73*  CREATININE 1.67*  < > 2.45* 3.17* 3.50* 3.59* 3.07*  CALCIUM 8.5*  < > 8.5* 8.5* 8.5* 8.5* 8.3*  MG 1.8  --   --   --   --   --   --   < > = values in this  interval not displayed.  Liver Function Tests:  Recent Labs Lab 05/09/17 0655  AST 23  ALT 15  ALKPHOS 73  BILITOT 0.7  PROT 5.5*  ALBUMIN 2.3*   No results for input(s): LIPASE, AMYLASE in the last 168 hours. No results for input(s): AMMONIA in the last 168 hours.  CBC:  Recent Labs Lab 05/03/17 0448 05/04/17 0600 05/06/17 0526 05/06/17 1757 05/08/17 1058  WBC 12.8* 14.6* 10.9 10.5 8.5  HGB 9.2* 9.4* 8.3* 9.3* 8.3*  HCT 27.0* 28.3* 24.2* 27.2* 24.3*  MCV 94.9 95.3 95.8 96.4 94.4  PLT 266 257 219 244 230    Cardiac Enzymes:  Recent Labs Lab 04/18/2017 1523 04/20/2017 2117  TROPONINI 0.16* 0.16*    BNP: Invalid input(s): POCBNP  CBG: No results for input(s): GLUCAP in the last 168 hours.  Microbiology: Results for orders placed or performed during the hospital encounter of 05/10/2017  MRSA PCR Screening     Status: None   Collection Time: 05/04/17  8:27 AM  Result Value Ref Range Status   MRSA by PCR NEGATIVE NEGATIVE Final    Comment:        The GeneXpert MRSA Assay (FDA approved for NASAL specimens only), is one component  of a comprehensive MRSA colonization surveillance program. It is not intended to diagnose MRSA infection nor to guide or monitor treatment for MRSA infections.   Culture, expectorated sputum-assessment     Status: None   Collection Time: 05/05/17 10:55 AM  Result Value Ref Range Status   Specimen Description SPUTUM  Final   Special Requests Normal  Final   Sputum evaluation THIS SPECIMEN IS ACCEPTABLE FOR SPUTUM CULTURE  Final   Report Status 05/05/2017 FINAL  Final  Culture, respiratory (NON-Expectorated)     Status: None   Collection Time: 05/05/17 10:55 AM  Result Value Ref Range Status   Specimen Description SPUTUM  Final   Special Requests Normal Reflexed from [id number]  Final   Gram Stain   Final    FEW WBC PRESENT, PREDOMINANTLY PMN FEW GRAM NEGATIVE RODS FEW GRAM POSITIVE COCCI IN PAIRS FEW GRAM POSITIVE RODS RARE  BUDDING YEAST SEEN    Culture   Final    Consistent with normal respiratory flora. Performed at [facility], [place] [address] [place], [place] [address]    Report Status 04/28/2017 FINAL  Final    Coagulation Studies:  Recent Labs  05/13/2017 0348 05/08/17 0552 05/09/17 0655  LABPROT 30.5* 27.3* 19.5*  INR 2.85 2.48 1.63    Urinalysis: No results for input(s): COLORURINE, LABSPEC, PHURINE, GLUCOSEU, HGBUR, BILIRUBINUR, KETONESUR, PROTEINUR, UROBILINOGEN, NITRITE, LEUKOCYTESUR in the last 72 hours.  Invalid input(s): APPERANCEUR    Imaging: US Renal  Result Date: 04/26/2017 CLINICAL DATA:  83 year old female with history of acute renal failure. EXAM: RENAL / URINARY TRACT ULTRASOUND COMPLETE COMPARISON:  None. FINDINGS: Right Kidney: Length: 9.7 cm. Echogenicity within normal limits. Severe cortical thinning. No hydronephrosis. Trace amount of perinephric fluid. Two small anechoic lesions with increased through transmission in the upper and interpolar regions, measuring up to 1.5 x 1.3 x 1.9 cm. Left Kidney: Length: 11.2 cm. Echogenicity within normal limits. In the upper pole there is a 5.2 x 3.4 x 5.3 cm anechoic lesion with increased through transmission, compatible with a simple cyst. No mass hydronephrosis. Bladder: Completely decompressed with a Foley balloon catheter in place. IMPRESSION: 1. No hydronephrosis. 2. Diffuse cortical thinning in the right kidney. 3. Multiple small simple cysts in both kidneys. Electronically Signed   By: Vinnie Langton M.D.   On: 04/18/2017 11:10     Medications:   . sodium chloride 60 mL/hr at 05/08/17 2318  . sodium chloride    . cefTRIAXone (ROCEPHIN) 1 g IVPB Stopped (05/09/17 0849)  . heparin 800 Units/hr (05/09/17 0828)   . albuterol  2.5 mg Nebulization Q4H  . amiodarone  200 mg Oral BID  . azithromycin  250 mg Oral Daily  . budesonide (PULMICORT) nebulizer solution  0.5 mg Nebulization BID  . docusate sodium  100 mg Oral  BID  . feeding supplement (ENSURE ENLIVE)  237 mL Oral TID BM  . ferrous sulfate  325 mg Oral Q breakfast  . fluticasone  1 spray Each Nare Daily  . guaiFENesin  1,200 mg Oral BID  . mometasone-formoterol  2 puff Inhalation BID  . multivitamin with minerals  1 tablet Oral Daily  . pantoprazole  40 mg Oral Daily  . phytonadione  3 mg Oral Daily  . polyethylene glycol  17 g Oral Daily  . predniSONE  20 mg Oral Q breakfast  . senna  1 tablet Oral BID  . tiotropium  18 mcg Inhalation Daily  . tuberculin  5 Units Intradermal Once  acetaminophen **OR** acetaminophen, bisacodyl, HYDROcodone-acetaminophen, morphine injection, ondansetron **OR** ondansetron (ZOFRAN) IV, pneumococcal 23 valent vaccine, senna  Assessment/ Plan:  Ms. Cindy Weaver is a 83 y.o. black female with Chronic kidney disease stage III baseline EGFR 52, congestive heart failure, hyperlipidemia, disease status post CABG, anemia of chronic kidney disease, chronic atrial fibrillation, severe mitral regurgitation, hypertension, GERD, COPD, who was admitted to [facility] on 04/26/2017 for evaluation of Fall.  Patient developed left hip fracture and also had Atrial fibrillation with RVR and acute renal failure.  1. Acute renal failure on chronic kidney disease stage III: baseline creatinine 1.18, GFR of 52 from 04/01/17 Oliguric urine output.  - Continue IV fluids: NS at 35m/hr. Check CXR due to cough.  - Hemodialysis treatment on 7/24 first treatment. Hold dialysis today in hopes of recovery.   2. Anemia of chronic kidney disease. Hemoglobin 8.3.  3. Hypotension with atrial fibrillation with rapid ventricular response:  - on amiodarone - Appreciate cardiology input. Monitor for exacerbation of congestive heart failure.   4. Acute exacerbation of COPD: cough and wheezing on examination - on ceftriaxone and azithromycin. Nebulizer treatments and supportive care - Check CXR  5. Coagulopathy: INR 1.6   LOS: 7 Lyrick Worland,  Aurthur Wingerter 7/25/201810:43 AM

## 2017-05-09 NOTE — Evaluation (Signed)
Clinical/Bedside Swallow Evaluation Patient Details  Name: Cindy Weaver W Wist MRN: [id number] Date of Birth: 03/13/1926  Today's Date: 05/09/2017 Time: SLP Start Time (ACUTE ONLY): 1602 SLP Stop Time (ACUTE ONLY): 1631 SLP Time Calculation (min) (ACUTE ONLY): 29 min  Past Medical History:  Past Medical History:  Diagnosis Date  . A-fib ([facility])    a. on Coumadin; b. CHADS2VASc => 5 (CHF, age x 2, vascular disease, female)  . Chronic systolic CHF (congestive heart failure) ([facility])   . Coronary artery disease    a. s/p CABG w/ SVG-PRDA  . S/P mitral valve clip implantation   . Status post aortic valve replacement    Past Surgical History:  Past Surgical History:  Procedure Laterality Date  . AORTIC VALVE REPLACEMENT    . CORONARY ARTERY BYPASS GRAFT    . MITRAL VALVE REPAIR     HPI:      Assessment / Plan / Recommendation Clinical Impression  pt presents at a moderate to severe oral pharyngeal dysphagia characterized by increased mastication and slowed a-p transit with specific bolus textures. pt was noted to have multiple instances of coughin gthroughout assessment, however had significant coughing with thin liquids via cup. pt was unable to be assessed with straw due to poor oral status and dry mouth with painful tongue as reported by pt.   slp recommends to downgrade diet to dys 2 with moistened foods and honey thick liquids.  SLP Visit Diagnosis: Dysphagia, oropharyngeal phase (R13.12)    Aspiration Risk  Moderate aspiration risk;Severe aspiration risk    Diet Recommendation Dysphagia 2 (Fine chop);Honey-thick liquid   Liquid Administration via: Cup;Spoon Medication Administration: Whole meds with puree Supervision: Patient able to self feed;Staff to assist with self feeding Compensations: Minimize environmental distractions;Slow rate;Small sips/bites;Follow solids with liquid Postural Changes: Seated upright at 90 degrees    Other  Recommendations Oral Care Recommendations:  Oral care BID Other Recommendations: Remove water pitcher   Follow up Recommendations        Frequency and Duration min 4x/week  2 weeks       Prognosis Prognosis for Safe Diet Advancement: Fair Barriers to Reach Goals: Severity of deficits      Swallow Study   General Date of Onset: 05/08/17 Type of Study: Bedside Swallow Evaluation Diet Prior to this Study: Regular;Thin liquids Temperature Spikes Noted: No Respiratory Status: Nasal cannula Behavior/Cognition: Alert;Cooperative;Pleasant mood Oral Cavity Assessment: Within Functional Limits Oral Care Completed by SLP: No Oral Cavity - Dentition: Missing dentition Vision: Functional for self-feeding Self-Feeding Abilities: Able to feed self Patient Positioning: Upright in bed Baseline Vocal Quality: Breathy Volitional Cough: Strong Volitional Swallow: Able to elicit    Oral/Motor/Sensory Function Overall Oral Motor/Sensory Function: Within functional limits   Ice Chips Ice chips: Not tested   Thin Liquid Thin Liquid: Impaired Presentation: Cup Oral Phase Impairments: Reduced labial seal Oral Phase Functional Implications: Prolonged oral transit Pharyngeal  Phase Impairments: Wet Vocal Quality;Decreased hyoid-laryngeal movement;Cough - Immediate;Cough - Delayed    Nectar Thick Nectar Thick Liquid: Not tested   Honey Thick Honey Thick Liquid: Within functional limits Presentation: Cup;Spoon   Puree Puree: Within functional limits Presentation: Self Fed;Spoon   Solid   GO   Solid: Within functional limits Presentation: Spoon;Self Fed Other Comments: pt with intermittent coughing throughout however pt able to masticate and initiate swallow wfl.        Rosanna Bickle Harris Sauber 05/09/2017,4:57 PM

## 2017-05-09 NOTE — Progress Notes (Signed)
ANTICOAGULATION CONSULT NOTE -  Pharmacy Consult for Heparin Drip/Bridging   Indication: atrial fibrillation  Allergies  Allergen Reactions  . Penicillins Itching and Rash    Other reaction(s): RASH  . Enalapril Other (See Comments)    cough  . Statins Other (See Comments)    Other reaction(s): MUSCLE PAIN    Patient Measurements: Height: 5' 8.5" (174 cm) Weight: 147 lb 11.3 oz (67 kg) IBW/kg (Calculated) : 65.05 Vital Signs: Temp: 98.3 F (36.8 C) (07/25 1555) Temp Source: Oral (07/25 1320) BP: 94/47 (07/25 1648) Pulse Rate: 104 (07/25 1648)  Labs:  Recent Labs  05/12/2017 0348 05/08/17 0552 05/08/17 1058 05/09/17 0655 05/09/17 0815 05/09/17 1658  HGB  --   --  8.3*  --   --   --   HCT  --   --  24.3*  --   --   --   PLT  --   --  230  --   --   --   APTT  --   --   --   --  41*  --   LABPROT 30.5* 27.3*  --  19.5*  --   --   INR 2.85 2.48  --  1.63  --   --   HEPARINUNFRC  --   --   --   --   --  0.25*  CREATININE 3.50* 3.59*  --  3.07*  --   --     Estimated Creatinine Clearance: 13 mL/min (A) (by C-G formula based on SCr of 3.07 mg/dL (H)).   Medical History: Past Medical History:  Diagnosis Date  . A-fib (HCC)    a. on Coumadin; b. CHADS2VASc => 5 (CHF, age x 2, vascular disease, female)  . Chronic systolic CHF (congestive heart failure) ([facility]CC)   . Coronary artery disease    a. s/p CABG w/ SVG-PRDA  . S/P mitral valve clip implantation   . Status post aortic valve replacement     Assessment: 83 yo female with PMH of A.Fib, admitted with left femoral neck fracture secondary to fall. Patient takes warfarin, which has been placed on hold.  Pharmacy consulted for heparin dosing for bridge therapy for orthopedic surgery.   7/18 INR: 3.59 (Vit K 0.2 mg PO) 7/19 INR: 4.24 (Vit K 0.2 mg PO) 7/20 INR 3.29 (Vit K 1mg  PO) 7/21 INR 3.50 (Vit K 1mg  PO) 7/22 INR 3.30 (Vit K 1mg  PO) 7/23 INR 2.85 (Vit K 1 mg PO) 7/24: INR: 2.48  (Vit K 2.5 mg PO) 7/25: INR:  1.63   Goal of Therapy:  INR <2 Heparin level 0.3-0.7 units/ml Monitor platelets by anticoagulation protocol: Yes  Plan:  Spoke with MD Elisabeth PigeonVachhani and he would like to start Heparin gtt since INR is now < 2. Will initiate Heparin gtt @ 800 units/hr. 1st Heparin level to be drawn @ 1630 on 7/25.   7/25: HL@ 1658= 0.25. Will give Heparin bolus of 1000 units and increase drip to 950 units/hr. Next Heparin level in 8 hours.  Angelique BlonderMerrill,Quatisha Zylka A, PharmD, BCPS Clinical Pharmacist 05/09/2017 6:56 PM

## 2017-05-09 NOTE — Progress Notes (Signed)
Orders placed for dialysis today per Dr. Wynelle LinkKolluru. Primary RN notified.

## 2017-05-09 NOTE — Progress Notes (Signed)
Hd start 

## 2017-05-09 NOTE — Progress Notes (Signed)
Pre hd assessment  

## 2017-05-09 NOTE — Progress Notes (Signed)
[employer] [facility] - [place] at [facility]   PATIENT NAME: Cindy Weaver    MR#:  [id number]  DATE OF BIRTH:  Feb 18, 1926  SUBJECTIVE:  CHIEF COMPLAINT:   Chief Complaint  Patient presents with  . Fall  . Cough   -  No fevers. Pain better controlled. Requiring less amounts of oxygen today. -Heart rate is controlled, IV amiodarone and switched to oral. INR came down to 1.6. Renal function continued to get worse so finally started on hemodialysis. She have c/o more cough.  REVIEW OF SYSTEMS:  Review of Systems  Constitutional: Positive for malaise/fatigue. Negative for chills and fever.  HENT: Negative for ear discharge, hearing loss, nosebleeds and sore throat.   Eyes: Negative for blurred vision and double vision.  Respiratory: Positive for cough. Negative for shortness of breath and wheezing.   Cardiovascular: Negative for chest pain, palpitations and leg swelling.  Gastrointestinal: Positive for constipation. Negative for abdominal pain, diarrhea, nausea and vomiting.  Genitourinary: Negative for dysuria.  Musculoskeletal: Positive for joint pain and myalgias.  Neurological: Negative for dizziness and seizures.    DRUG ALLERGIES:   Allergies  Allergen Reactions  . Penicillins Itching and Rash    Other reaction(s): RASH  . Enalapril Other (See Comments)    cough  . Statins Other (See Comments)    Other reaction(s): MUSCLE PAIN    VITALS:  Blood pressure (!) 94/47, pulse (!) 104, temperature 98.3 F (36.8 C), resp. rate 20, height 5' 8.5" (1.74 m), weight 67 kg (147 lb 11.3 oz), SpO2 98 %.  PHYSICAL EXAMINATION:  Physical Exam  GENERAL:  83 y.o.-year-old patient lying in the bed with no acute distress. Appears uncomfortable in bed. Denies any pain EYES: Pupils equal, round, reactive to light and accommodation. No scleral icterus. Extraocular muscles intact.  HEENT: Head atraumatic, normocephalic. Oropharynx and nasopharynx clear.  NECK:  Supple, no  jugular venous distention. No thyroid enlargement, no tenderness.  LUNGS: Normal breath sounds bilaterally, no wheezing, some crepitation. No use of accessory muscles of respiration. Rhonchi at the Lung bases, left greater than right. CARDIOVASCULAR: S1, S2 normal. No  rubs, or gallops. Loud 4/6 systolic murmur heard ABDOMEN: Soft, nontender, nondistended. Bowel sounds present. No organomegaly or mass.  EXTREMITIES: No pedal edema, cyanosis, or clubbing. Left leg in traction NEUROLOGIC: Cranial nerves II through XII are intact. Muscle strength 4/5 in all extremities except left leg due to pain from fracture and being on traction. Sensation intact. Gait not checked.  PSYCHIATRIC: The patient is alert and oriented x 3.  SKIN: No obvious rash, lesion, or ulcer.    LABORATORY PANEL:   CBC  Recent Labs Lab 05/08/17 1058  WBC 8.5  HGB 8.3*  HCT 24.3*  PLT 230   ------------------------------------------------------------------------------------------------------------------  Chemistries   Recent Labs Lab 05/03/17 0448  05/09/17 0655  NA 134*  < > 134*  K 4.7  < > 4.7  CL 94*  < > 95*  CO2 29  < > 26  GLUCOSE 123*  < > 94  BUN 31*  < > 73*  CREATININE 1.67*  < > 3.07*  CALCIUM 8.5*  < > 8.3*  MG 1.8  --   --   AST  --   --  23  ALT  --   --  15  ALKPHOS  --   --  73  BILITOT  --   --  0.7  < > = values in this interval not displayed. ------------------------------------------------------------------------------------------------------------------  Cardiac Enzymes No results for input(s): TROPONINI in the last 168 hours. ------------------------------------------------------------------------------------------------------------------  RADIOLOGY:  Dg Chest Port 1 View  Result Date: 05/09/2017 CLINICAL DATA:  Followup cough, congestion and wheezing. EXAM: PORTABLE CHEST 1 VIEW COMPARISON:  05/03/2017.  2017/07/14. FINDINGS: Lung bases excluded from the film. Previous median  sternotomy. Chronic cardiomegaly and aortic atherosclerosis. Enlarged right effusion with right lung volume loss. Volume loss also at the left base. Possible small or effusion on the left. Chronic interstitial lung markings probably related to a combination of chronic lung disease and mild edema. IMPRESSION: Effusions, right larger than left, with volume loss right more than left. Cardiomegaly and aortic atherosclerosis. Chronic lung disease and likely mild interstitial edema. Electronically Signed   By: Paulina FusiMark  Shogry M.D.   On: 05/09/2017 11:14    EKG:   Orders placed or performed during the hospital encounter of 11-01-16  . ED EKG  . ED EKG    ASSESSMENT AND PLAN:   83 year old female with multiple medical problems including A. fib on Coumadin, CAD status post CABG, mitral valve clipping for severe MR, bioprosthetic aortic valve replacement surgery, chronic respiratory failure on 2 L home oxygen, Systolic CHF with EF of 45% presents to the hospital secondary to fall and noted to have left hip fracture.  #1 atrial fibrillation with rapid ventricular response-likely triggered by her acute systolic failure and hypoxia - Initially given amiodarone drip- switched to oral amio. started on oral metoprolol. -Echocardiogram with EF of 35-40%. -Appreciate cardiology consult. not a candidate for digoxin due to renal failure. -Due to her surgery, Coumadin on hold.  - amiodarone. HR is under control. INR is < 2 now, with oral vitamin K.   start on heparin drip until she finishes the surgery and able to restart the Coumadin.  #2 acute on chronic systolic CHF- received IV Lasix on admission. -Due to worsening renal failure, will hold Lasix . - Appreciate cardiology consult - now on gentle hydration for renal failure. - have pleural effusion, may need repeat HD.  #3 acute on chronic respiratory failure with hypoxia-secondary to systolic CHF-acute and chronic. EF- 35% -also significant pneumonitis  noted on chest x-ray. MRSA PCR is negative. Was on cefepime. Switch to rocephin now. We may stop after total 7 days of treatment. -Continue to wean oxygen as tolerated. At home she is only on 2 L. - requested pulmonary consult as well- since she will need surgery. -Continue nebulizers.   Encouraged to use incentive spirometry.  #4 acute renal failure-Baseline creatinine seems to be between 1.2-1.3 - Gradually getting worse. avoid nephrotoxins - ATn from hypotension and also diuresis, gentle hydration today - gradual worsening- called nephro consult. - Cont hydration. Does not have much urine output.  - nephro started on HD 05/08/17. - advised to cont HD as she have some edema and pleural effusion.  #5 left hip fracture-secondary to mechanical fall. Ortho has been consulted. -Patient ready for surgery from cardiac point optimized. HR is stable now.  -Coumadin  On hold,  Continue pain medications for pain control. - heparin IV, as ortho is planning sx on Friday morning.  #6 DVT prophylaxis - was on coumadin, now on heparin drip.  #7 constipation    Added miralax and senna. Poor oral intake.  All the records are reviewed and case discussed with Care Management/Social Worker. Management plans discussed with the patient, family and they are in agreement.  CODE STATUS: Full code  TOTAL TIME TAKING CARE OF THIS PATIENT: 35  minutes.   POSSIBLE D/C IN 3-4 DAYS, DEPENDING ON CLINICAL CONDITION.   Altamese DillingVACHHANI, Yaviel Kloster M.D on 05/09/2017 at 9:37 PM  Between 7am to 6pm - Pager - [phone]  After 6pm go to [email or web address] - Social research officer, governmentpassword EPAS [facility]  [facility] [place] [facility]  Office  [phone]  CC: Primary care physician; Elita QuickHill, [facility]

## 2017-05-09 NOTE — Progress Notes (Signed)
HD completed without issue. 0.5L removed. Goal decreased x1 during treatment due to hypotension. Patient remained asymptomatic throughout. Denies pain/complaints. Report called to primary RN

## 2017-05-09 NOTE — Progress Notes (Signed)
Pre hd vitals/info

## 2017-05-10 ENCOUNTER — Encounter: Payer: Self-pay | Admitting: Anesthesiology

## 2017-05-10 LAB — CBC
HEMATOCRIT: 23.6 % — AB (ref 35.0–47.0)
Hemoglobin: 7.9 g/dL — ABNORMAL LOW (ref 12.0–16.0)
MCH: 32.1 pg (ref 26.0–34.0)
MCHC: 33.6 g/dL (ref 32.0–36.0)
MCV: 95.4 fL (ref 80.0–100.0)
Platelets: 202 10*3/uL (ref 150–440)
RBC: 2.47 MIL/uL — AB (ref 3.80–5.20)
RDW: 14.2 % (ref 11.5–14.5)
WBC: 12.1 10*3/uL — AB (ref 3.6–11.0)

## 2017-05-10 LAB — RENAL FUNCTION PANEL
Albumin: 2.1 g/dL — ABNORMAL LOW (ref 3.5–5.0)
Anion gap: 9 (ref 5–15)
BUN: 46 mg/dL — AB (ref 6–20)
CHLORIDE: 100 mmol/L — AB (ref 101–111)
CO2: 28 mmol/L (ref 22–32)
Calcium: 8 mg/dL — ABNORMAL LOW (ref 8.9–10.3)
Creatinine, Ser: 2.48 mg/dL — ABNORMAL HIGH (ref 0.44–1.00)
GFR, EST AFRICAN AMERICAN: 19 mL/min — AB (ref >60)
GFR, EST NON AFRICAN AMERICAN: 16 mL/min — AB (ref >60)
Glucose, Bld: 99 mg/dL (ref 65–99)
POTASSIUM: 3.9 mmol/L (ref 3.5–5.1)
Phosphorus: 2.2 mg/dL — ABNORMAL LOW (ref 2.5–4.6)
Sodium: 137 mmol/L (ref 135–145)

## 2017-05-10 LAB — PROTIME-INR
INR: 1.33
PROTHROMBIN TIME: 16.6 s — AB (ref 11.4–15.2)

## 2017-05-10 LAB — HEPARIN LEVEL (UNFRACTIONATED)
HEPARIN UNFRACTIONATED: 0.29 [IU]/mL — AB (ref 0.30–0.70)
Heparin Unfractionated: <0.1 IU/mL — ABNORMAL LOW (ref 0.30–0.70)

## 2017-05-10 LAB — PREPARE RBC (CROSSMATCH)

## 2017-05-10 LAB — ABO/RH: ABO/RH(D): O POS

## 2017-05-10 MED ORDER — CLINDAMYCIN PHOSPHATE 600 MG/50ML IV SOLN
600.0000 mg | INTRAVENOUS | Status: AC
  Filled 2017-05-10 (×2): qty 50

## 2017-05-10 MED ORDER — HEPARIN (PORCINE) IN NACL 100-0.45 UNIT/ML-% IJ SOLN
1200.0000 [IU]/h | INTRAMUSCULAR | Status: AC
  Administered 2017-05-10: 1200 [IU]/h via INTRAVENOUS

## 2017-05-10 MED ORDER — SODIUM CHLORIDE 0.9 % IV SOLN: Freq: Once | INTRAVENOUS | Status: DC

## 2017-05-10 MED ORDER — HEPARIN BOLUS VIA INFUSION
1000.0000 [IU] | Freq: Once | INTRAVENOUS | Status: AC
  Administered 2017-05-10: 1000 [IU] via INTRAVENOUS
  Filled 2017-05-10: qty 1000

## 2017-05-10 MED ORDER — HEPARIN BOLUS VIA INFUSION
2000.0000 [IU] | Freq: Once | INTRAVENOUS | Status: AC
  Administered 2017-05-10: 2000 [IU] via INTRAVENOUS
  Filled 2017-05-10: qty 2000

## 2017-05-10 NOTE — Progress Notes (Signed)
[employer] [facility] - [place] at [facility]   PATIENT NAME: Cindy FoleyLiller Weaver    MR#:  [id number]  DATE OF BIRTH:  08/27/1926  SUBJECTIVE:  CHIEF COMPLAINT:   Chief Complaint  Patient presents with  . Fall  . Cough   -  No fevers. Pain better controlled. Requiring less amounts of oxygen today. -Heart rate is controlled, IV amiodarone and switched to oral. INR came down to 1.6. Renal function continued to get worse so finally started on hemodialysis. She have c/o cough, better than yesterday now.  REVIEW OF SYSTEMS:  Review of Systems  Constitutional: Positive for malaise/fatigue. Negative for chills and fever.  HENT: Negative for ear discharge, hearing loss, nosebleeds and sore throat.   Eyes: Negative for blurred vision and double vision.  Respiratory: Positive for cough. Negative for shortness of breath and wheezing.   Cardiovascular: Negative for chest pain, palpitations and leg swelling.  Gastrointestinal: Positive for constipation. Negative for abdominal pain, diarrhea, nausea and vomiting.  Genitourinary: Negative for dysuria.  Musculoskeletal: Positive for joint pain and myalgias.  Neurological: Negative for dizziness and seizures.    DRUG ALLERGIES:   Allergies  Allergen Reactions  . Penicillins Itching and Rash    Other reaction(s): RASH  . Enalapril Other (See Comments)    cough  . Statins Other (See Comments)    Other reaction(s): MUSCLE PAIN    VITALS:  Blood pressure (!) 89/52, pulse 97, temperature (!) 97.5 F (36.4 C), temperature source Oral, resp. rate 18, height 5' 8.5" (1.74 m), weight 70.6 kg (155 lb 10.3 oz), SpO2 94 %.  PHYSICAL EXAMINATION:  Physical Exam  GENERAL:  83 y.o.-year-old patient lying in the bed with no acute distress. Appears uncomfortable in bed. Denies any pain EYES: Pupils equal, round, reactive to light and accommodation. No scleral icterus. Extraocular muscles intact.  HEENT: Head atraumatic, normocephalic.  Oropharynx and nasopharynx clear.  NECK:  Supple, no jugular venous distention. No thyroid enlargement, no tenderness.  LUNGS: Normal breath sounds bilaterally, no wheezing, some crepitation. No use of accessory muscles of respiration. Rhonchi at the Lung bases, left greater than right. CARDIOVASCULAR: S1, S2 normal. No  rubs, or gallops. Loud 4/6 systolic murmur heard ABDOMEN: Soft, nontender, nondistended. Bowel sounds present. No organomegaly or mass.  EXTREMITIES: No pedal edema, cyanosis, or clubbing. Left leg in traction NEUROLOGIC: Cranial nerves II through XII are intact. Muscle strength 4/5 in all extremities except left leg due to pain from fracture and being on traction. Sensation intact. Gait not checked.  PSYCHIATRIC: The patient is alert and oriented x 3.  SKIN: No obvious rash, lesion, or ulcer.    LABORATORY PANEL:   CBC  Recent Labs Lab 05/10/17 0306  WBC 12.1*  HGB 7.9*  HCT 23.6*  PLT 202   ------------------------------------------------------------------------------------------------------------------  Chemistries   Recent Labs Lab 05/09/17 0655 05/10/17 0306  NA 134* 137  K 4.7 3.9  CL 95* 100*  CO2 26 28  GLUCOSE 94 99  BUN 73* 46*  CREATININE 3.07* 2.48*  CALCIUM 8.3* 8.0*  AST 23  --   ALT 15  --   ALKPHOS 73  --   BILITOT 0.7  --    ------------------------------------------------------------------------------------------------------------------  Cardiac Enzymes No results for input(s): TROPONINI in the last 168 hours. ------------------------------------------------------------------------------------------------------------------  RADIOLOGY:  Dg Chest Port 1 View  Result Date: 05/09/2017 CLINICAL DATA:  Followup cough, congestion and wheezing. EXAM: PORTABLE CHEST 1 VIEW COMPARISON:  05/03/2017.  2017-08-29. FINDINGS: Lung bases excluded from  the film. Previous median sternotomy. Chronic cardiomegaly and aortic atherosclerosis. Enlarged  right effusion with right lung volume loss. Volume loss also at the left base. Possible small or effusion on the left. Chronic interstitial lung markings probably related to a combination of chronic lung disease and mild edema. IMPRESSION: Effusions, right larger than left, with volume loss right more than left. Cardiomegaly and aortic atherosclerosis. Chronic lung disease and likely mild interstitial edema. Electronically Signed   By: Paulina Fusi M.D.   On: 05/09/2017 11:14    EKG:   Orders placed or performed during the hospital encounter of 05/10/2017  . ED EKG  . ED EKG    ASSESSMENT AND PLAN:   83 year old female with multiple medical problems including A. fib on Coumadin, CAD status post CABG, mitral valve clipping for severe MR, bioprosthetic aortic valve replacement surgery, chronic respiratory failure on 2 L home oxygen, Systolic CHF with EF of 45% presents to the hospital secondary to fall and noted to have left hip fracture.  #1 atrial fibrillation with rapid ventricular response-likely triggered by her acute systolic failure and hypoxia - Initially given amiodarone drip- switched to oral amio. started on oral metoprolol. -Echocardiogram with EF of 35-40%. -Appreciate cardiology consult. not a candidate for digoxin due to renal failure. -Due to her surgery, Coumadin on hold.  - amiodarone. HR is under control. INR is < 2 now, with oral vitamin K.   start on heparin drip until she finishes the surgery and able to restart the Coumadin.  #2 acute on chronic systolic CHF- received IV Lasix on admission. -Due to worsening renal failure, will hold Lasix . - Appreciate cardiology consult - now on gentle hydration for renal failure. - have pleural effusion, may need repeat HD.  #3 acute on chronic respiratory failure with hypoxia-secondary to systolic CHF-acute and chronic. EF- 35% -also significant pneumonitis noted on chest x-ray. MRSA PCR is negative. Was on cefepime. Switch to  rocephin now. We may stop after total 7 days of treatment. -Continue to wean oxygen as tolerated. At home she is only on 2 L. - requested pulmonary consult as well- since she will need surgery. -Continue nebulizers.   Encouraged to use incentive spirometry.  #4 acute renal failure-Baseline creatinine seems to be between 1.2-1.3 - Gradually getting worse. avoid nephrotoxins - ATn from hypotension and also diuresis, gentle hydration today - gradual worsening- called nephro consult. - Cont hydration. Does not have much urine output.  - nephro started on HD 05/08/17. - advised to cont HD as she have some edema and pleural effusion.  #5 left hip fracture-secondary to mechanical fall. Ortho has been consulted. -Patient ready for surgery from cardiac point optimized. HR is stable now.  -Coumadin  On hold,  Continue pain medications for pain control. - heparin IV, as ortho is planning sx on Friday morning.  #6 DVT prophylaxis - was on coumadin, now on heparin drip.  #7 constipation    Added miralax and senna. Poor oral intake.  #8 anemia of blood loss   Hb < 8 today, and will go for surgery tomorrow, so transfused one unit PRBC during HD today.   discussed with pt about possible side effects of blood transfusion- including more common like low grade fever to rare and serious like renal and lung involvement and infections.  She understood- and due to necessity of transfusion- agreed to receive the transfusion.  All the records are reviewed and case discussed with Care Management/Social Worker. Management plans discussed with  the patient, family and they are in agreement.  CODE STATUS: Full code  TOTAL TIME TAKING CARE OF THIS PATIENT: 35 minutes.   POSSIBLE D/C IN 3-4 DAYS, DEPENDING ON CLINICAL CONDITION.   Altamese DillingVACHHANI, Laneka Mcgrory M.D on 05/10/2017 at 10:59 PM  Between 7am to 6pm - Pager - [phone]  After 6pm go to [email or web address] - Social research officer, governmentpassword EPAS [facility]  [facility]    Office  [phone]  CC: Primary care physician; Elita QuickHill, [facility]

## 2017-05-10 NOTE — Consult Note (Signed)
ORTHOPAEDICS PROGRESS NOTE  PATIENT NAME: Cindy Weaver DOB: 01/17/1926  MRN: [id number]  Subjective: No new complaints. She believes there breathing is better. She denies any chest pain.  Objective: Vital signs in last 24 hours: Temp:  [97.7 F (36.5 C)-98.3 F (36.8 C)] 97.7 F (36.5 C) (07/26 0407) Pulse Rate:  [97-108] 105 (07/26 0407) Resp:  [14-26] 18 (07/26 0407) BP: (78-104)/(47-65) 94/51 (07/26 0407) SpO2:  [93 %-100 %] 94 % (07/26 0407) Weight:  [67 kg (147 lb 11.3 oz)-70.6 kg (155 lb 9.6 oz)] 70.6 kg (155 lb 9.6 oz) (07/26 0500)  Intake/Output from previous day: 07/25 0701 - 07/26 0700 In: 1560 [I.V.:1440; IV Piggyback:120] Out: 700 [Urine:200]   Recent Labs  05/08/17 1058 05/09/17 0655 05/10/17 0306  WBC 8.5  --  12.1*  HGB 8.3*  --  7.9*  HCT 24.3*  --  23.6*  PLT 230  --  202  K  --  4.7 3.9  CL  --  95* 100*  CO2  --  26 28  BUN  --  73* 46*  CREATININE  --  3.07* 2.48*  GLUCOSE  --  94 99  CALCIUM  --  8.3* 8.0*  INR  --  1.63 1.33    EXAM [place] traction in place. Skin intact.  Assessment: Left femoral neck fracture  Plan: PT/INR in good range for surgery, making the patient a candidate for regional anesthesia. Heparin drip will need to be discontinued prior to surgery. I will defer the timing to the anesthesiologist. Plan for left hip hemiarthroplasty tomorrow morning. Orders placed Plans were discussed with the patient and her son-in-law.   James P. Angie FavaHooten, Jr. M.D.

## 2017-05-10 NOTE — Anesthesia Preprocedure Evaluation (Addendum)
Anesthesia Evaluation  Patient identified by MRN, date of birth, ID band  Reviewed: Allergy & Precautions, NPO status , Patient's Chart, lab work & pertinent test results, reviewed documented beta blocker date and time   Airway Mallampati: III  TM Distance: <3 FB     Dental  (+) Chipped, Poor Dentition   Pulmonary COPD,  COPD inhaler, former smoker,    Pulmonary exam normal        Cardiovascular + CAD and +CHF  + Valvular Problems/Murmurs MR and AI  Rhythm:Irregular     Neuro/Psych    GI/Hepatic negative GI ROS, GERD  ,  Endo/Other  negative endocrine ROS  Renal/GU ARF and Renal InsufficiencyRenal disease     Musculoskeletal  (+) Arthritis ,   Abdominal Normal abdominal exam  (+)   Peds negative pediatric ROS (+)  Hematology negative hematology ROS (+) anemia ,   Anesthesia Other Findings   Reproductive/Obstetrics                            Anesthesia Physical Anesthesia Plan  ASA: IV  Anesthesia Plan: Spinal   Post-op Pain Management:    Induction: Intravenous  PONV Risk Score and Plan:   Airway Management Planned:   Additional Equipment:   Intra-op Plan:   Post-operative Plan:   Informed Consent: I have reviewed the patients History and Physical, chart, labs and discussed the procedure including the risks, benefits and alternatives for the proposed anesthesia with the patient or authorized representative who has indicated his/her understanding and acceptance.   Dental advisory given  Plan Discussed with: CRNA and Surgeon  Anesthesia Plan Comments:        Anesthesia Quick Evaluation

## 2017-05-10 NOTE — Progress Notes (Signed)
Daily Progress Note   Patient Name: Cindy Weaver       Date: 05/10/2017 DOB: August 05, 1926  Age: 83 y.o. MRN#: [id number] Attending Physician: Vaughan Basta, * Primary Care Physician: [place] Date: 05/10/2017  Reason for Consultation/Follow-up: Establishing goals of care  Subjective: Ms. Maudlin lying in bed. Denies nausea now (daughter says she has complained today of this but does seem improved). Family at bedside.   Length of Stay: 8  Current Medications: Scheduled Meds:  . albuterol  2.5 mg Nebulization Q4H  . amiodarone  200 mg Oral BID  . azithromycin  250 mg Oral Daily  . budesonide (PULMICORT) nebulizer solution  0.5 mg Nebulization BID  . docusate sodium  100 mg Oral BID  . feeding supplement (ENSURE ENLIVE)  237 mL Oral TID BM  . ferrous sulfate  325 mg Oral Q breakfast  . fluticasone  1 spray Each Nare Daily  . guaiFENesin  1,200 mg Oral BID  . mometasone-formoterol  2 puff Inhalation BID  . multivitamin with minerals  1 tablet Oral Daily  . pantoprazole  40 mg Oral Daily  . phytonadione  3 mg Oral Daily  . polyethylene glycol  17 g Oral Daily  . predniSONE  20 mg Oral Q breakfast  . senna  1 tablet Oral BID  . tiotropium  18 mcg Inhalation Daily    Continuous Infusions: . sodium chloride 60 mL/hr at 05/10/17 0823  . sodium chloride    . cefTRIAXone (ROCEPHIN) 1 g IVPB Stopped (05/10/17 1009)  . clindamycin (CLEOCIN) IV    . heparin 1,200 Units/hr (05/10/17 1747)    PRN Meds: acetaminophen **OR** acetaminophen, bisacodyl, HYDROcodone-acetaminophen, metoCLOPramide (REGLAN) injection, morphine injection, ondansetron **OR** ondansetron (ZOFRAN) IV, pneumococcal 23 valent vaccine, senna  Physical Exam         Constitutional: She  is oriented to person, place, and time. She appears well-developed.  Head: Normocephalicand atraumatic.  Cardiovascular: An irregularly irregular rhythmpresent. Tachycardiapresent.  Pulmonary/Chest: Effort normal. No accessory muscle usage. No tachypnea. Mild-mod respiratory distress.  Productive cough, Rhonchi Abdominal: Soft. Normal appearance.  Neurological: She is alertand oriented to person, place, and time.  She is tired and restless Nursing noteand vitalsreviewed.   Vital Signs: BP (!) 94/53 (BP Location: Left Arm)   Pulse 94   Temp  98.1 F (36.7 C) (Oral)   Resp 17   Ht 5' 8.5" (1.74 m)   Wt 70.6 kg (155 lb 10.3 oz)   SpO2 94%   BMI 23.32 kg/m  SpO2: SpO2: 94 % O2 Device: O2 Device: Nasal Cannula O2 Flow Rate: O2 Flow Rate (L/min): 2 L/min  Intake/output summary:  Intake/Output Summary (Last 24 hours) at 05/10/17 1908 Last data filed at 05/10/17 1747  Gross per 24 hour  Intake          2822.78 ml  Output             1200 ml  Net          1622.78 ml   LBM: Last BM Date: 05/08/17 Baseline Weight: Weight: 56.2 kg (124 lb) Most recent weight: Weight: 70.6 kg (155 lb 10.3 oz)       Palliative Assessment/Data: 30%    Flowsheet Rows     Most Recent Value  Intake Tab  Referral Department  Hospitalist  Unit at Time of Referral  Cardiac/Telemetry Unit  Palliative Care Primary Diagnosis  Cardiac  Date Notified  05/03/2017  Palliative Care Type  New Palliative care  Reason for referral  Clarify Goals of Care  Date of Admission  04/27/2017  # of days IP prior to Palliative referral  5  Clinical Assessment  Psychosocial & Spiritual Assessment  Palliative Care Outcomes      Patient Active Problem List   Diagnosis Date Noted  . Cough   . Non-intractable vomiting with nausea   . Acute renal failure (ARF) (HCC)   . Goals of care, counseling/discussion   . Palliative care encounter   . Malnutrition of moderate degree 05/04/2017  . Atrial fibrillation with  RVR (HCC) 04/21/2017  . Hip fracture (HCC) 05/15/2017  . Acute on chronic systolic CHF (congestive heart failure) (HCC) 05/05/2017  . Acute on chronic respiratory failure with hypoxia (HCC) 05/09/2017  . Status post aortic valve replacement 05/08/2017  . Status post mitral valve repair 04/19/2017  . AKI (acute kidney injury) (HCC) 04/15/2017  . History of Coumadin therapy 05/05/2017  . Elevated troponin 05/11/2017  . Hypotension 05/13/2017  . A-fib (HCC) 06/17/2015  . Dental disease 06/17/2015  . MI (mitral incompetence) 04/25/2015  . Bursitis of knee 10/19/2014  . Anemia 02/18/2014  . Positional vertigo 12/15/2013  . CCF (congestive cardiac failure) (HCC) 05/22/2008    Palliative Care Assessment & Plan   HPI: 83 y.o.femalewith past medical history of systolic CHF (EF 45%) on 2L at home, CAD, atrial fibrillation, s/p aortic valve replacement, s/p mitral valve clipping for severe mitral regurgitationadmitted on 07/04/2018with fall with left hip fracture. Hospital stay complicated by atrial fibrillation and heart failure which has delayed surgery to repair hip fracture. Atrial fibrillation controlled and now with worsening renal failure and beginning trial of dialysis 05/08/17.     Assessment: I met again with Ms. Conrey and family at bedside. They are all happy that she is finally scheduled for surgery tomorrow. Although Ms. Davis says she is anxious. Has had dialysis again today and tolerated well with no complaints. Continue current interventions. Offered support and told them I would not be back until next week but to call my partner Friday for any palliative needs.   Recommendations/Plan:  Wet/congested cough: PXCR - may need more dialysis today. She has now agreed to guaifenesin as well. Nebs increased. Encourage IS, flutter valve, deep breathes, turning. SLP has made diet recommendations for dys 2, honey thick.     Nausea: Continue Zofran prn. Reglan added.   Insomnia: If not  improved with treatment of other symptoms may consider trazodone 25 mg qhs. She has needed pain medicine last night and this helped her sleep. Beginning to be restless and uncomfortable lying in bed.    Goals of Care and Additional Recommendations:  Limitations on Scope of Treatment: Full Scope Treatment  Code Status:  Full code  Prognosis:   Unable to determine but multiple severe comorbitities.   Discharge Planning:  To Be Determined   Thank you for allowing the Palliative Medicine Team to assist in the care of this patient.   Total Time 23mn Prolonged Time Billed  no       Greater than 50%  of this time was spent counseling and coordinating care related to the above assessment and plan.  AVinie Sill NP Palliative Medicine Team Pager # [phone](M-F 8a-5p) Team Phone # [phone](Nights/Weekends)

## 2017-05-10 NOTE — Progress Notes (Signed)
Start of hd, pt alert, no c/o, bp low, goal lowered

## 2017-05-10 NOTE — Progress Notes (Signed)
  End of hd 

## 2017-05-10 NOTE — Progress Notes (Signed)
[facility]  ROUNDING NOTE   Subjective:   Daughter at bedside.    Hemodialysis treatment yesterday. Second treatment. Tolerated treatment well.   Objective:  Vital signs in last 24 hours:  Temp:  [97.7 F (36.5 C)-98.6 F (37 C)] (P) 98.1 F (36.7 C) (07/26 1158) Pulse Rate:  [94-108] (P) 94 (07/26 1158) Resp:  [14-26] (P) 18 (07/26 1158) BP: (78-104)/(47-65) (P) 85/52 (07/26 1158) SpO2:  [93 %-100 %] (P) 100 % (07/26 1158) Weight:  [67 kg (147 lb 11.3 oz)-70.6 kg (155 lb 10.3 oz)] 70.6 kg (155 lb 10.3 oz) (07/26 1050)  Weight change: -5.574 kg (-12 lb 4.6 oz) Filed Weights   05/09/17 1555 05/10/17 0500 05/10/17 1050  Weight: 67 kg (147 lb 11.3 oz) 70.6 kg (155 lb 9.6 oz) 70.6 kg (155 lb 10.3 oz)    Intake/Output: I/O last 3 completed shifts: In: 2022 [I.V.:1902; IV Piggyback:120] Out: 950 [Urine:450; Other:500]   Intake/Output this shift:  Total I/O In: 260 [I.V.:210; IV Piggyback:50] Out: -   Physical Exam: General: NAD  Head: Normocephalic, atraumatic. Moist oral mucosal membranes  Eyes: Anicteric, PERRL  Neck: Supple, trachea midline  Lungs:  +wheezing  Heart: irregular  Abdomen:  Soft, nontender  Extremities: no peripheral edema. Left hip in traction  Neurologic: Nonfocal, moving all four extremities  Skin: No lesions  Access: Right femoral temp HD cathter Dr. Lucky Cowboy 5/52    Basic Metabolic Panel:  Recent Labs Lab 05/06/17 1757 05/06/2017 0348 05/08/17 0552 05/09/17 0655 05/10/17 0306  NA 131* 133* 128* 134* 137  K 5.3* 5.2* 5.6* 4.7 3.9  CL 93* 93* 90* 95* 100*  CO2 24 26 23 26 28   GLUCOSE 140* 137* 101* 94 99  BUN 72* 81* 90* 73* 46*  CREATININE 3.17* 3.50* 3.59* 3.07* 2.48*  CALCIUM 8.5* 8.5* 8.5* 8.3* 8.0*  PHOS  --   --   --   --  2.2*    Liver Function Tests:  Recent Labs Lab 05/09/17 0655 05/10/17 0306  AST 23  --   ALT 15  --   ALKPHOS 73  --   BILITOT 0.7  --   PROT 5.5*  --   ALBUMIN 2.3* 2.1*   No results  for input(s): LIPASE, AMYLASE in the last 168 hours. No results for input(s): AMMONIA in the last 168 hours.  CBC:  Recent Labs Lab 05/04/17 0600 05/06/17 0526 05/06/17 1757 05/08/17 1058 05/10/17 0306  WBC 14.6* 10.9 10.5 8.5 12.1*  HGB 9.4* 8.3* 9.3* 8.3* 7.9*  HCT 28.3* 24.2* 27.2* 24.3* 23.6*  MCV 95.3 95.8 96.4 94.4 95.4  PLT 257 219 244 230 202    Cardiac Enzymes: No results for input(s): CKTOTAL, CKMB, CKMBINDEX, TROPONINI in the last 168 hours.  BNP: Invalid input(s): POCBNP  CBG: No results for input(s): GLUCAP in the last 168 hours.  Microbiology: Results for orders placed or performed during the hospital encounter of 04/18/2017  MRSA PCR Screening     Status: None   Collection Time: 05/04/17  8:27 AM  Result Value Ref Range Status   MRSA by PCR NEGATIVE NEGATIVE Final    Comment:        The GeneXpert MRSA Assay (FDA approved for NASAL specimens only), is one component of a comprehensive MRSA colonization surveillance program. It is not intended to diagnose MRSA infection nor to guide or monitor treatment for MRSA infections.   Culture, expectorated sputum-assessment     Status: None   Collection Time: 05/05/17  10:55 AM  Result Value Ref Range Status   Specimen Description SPUTUM  Final   Special Requests Normal  Final   Sputum evaluation THIS SPECIMEN IS ACCEPTABLE FOR SPUTUM CULTURE  Final   Report Status 05/05/2017 FINAL  Final  Culture, respiratory (NON-Expectorated)     Status: None   Collection Time: 05/05/17 10:55 AM  Result Value Ref Range Status   Specimen Description SPUTUM  Final   Special Requests Normal Reflexed from [id number]  Final   Gram Stain   Final    FEW WBC PRESENT, PREDOMINANTLY PMN FEW GRAM NEGATIVE RODS FEW GRAM POSITIVE COCCI IN PAIRS FEW GRAM POSITIVE RODS RARE BUDDING YEAST SEEN    Culture   Final    Consistent with normal respiratory flora. Performed at [facility], [place] [address], [place], [place] [address]     Report Status 05/13/2017 FINAL  Final    Coagulation Studies:  Recent Labs  05/08/17 0552 05/09/17 0655 05/10/17 0306  LABPROT 27.3* 19.5* 16.6*  INR 2.48 1.63 1.33    Urinalysis: No results for input(s): COLORURINE, LABSPEC, PHURINE, GLUCOSEU, HGBUR, BILIRUBINUR, KETONESUR, PROTEINUR, UROBILINOGEN, NITRITE, LEUKOCYTESUR in the last 72 hours.  Invalid input(s): APPERANCEUR    Imaging: Dg Chest Port 1 View  Result Date: 05/09/2017 CLINICAL DATA:  Followup cough, congestion and wheezing. EXAM: PORTABLE CHEST 1 VIEW COMPARISON:  05/03/2017.  04/22/2017. FINDINGS: Lung bases excluded from the film. Previous median sternotomy. Chronic cardiomegaly and aortic atherosclerosis. Enlarged right effusion with right lung volume loss. Volume loss also at the left base. Possible small or effusion on the left. Chronic interstitial lung markings probably related to a combination of chronic lung disease and mild edema. IMPRESSION: Effusions, right larger than left, with volume loss right more than left. Cardiomegaly and aortic atherosclerosis. Chronic lung disease and likely mild interstitial edema. Electronically Signed   By: Nelson Chimes M.D.   On: 05/09/2017 11:14     Medications:   . sodium chloride 60 mL/hr at 05/10/17 0823  . sodium chloride    . cefTRIAXone (ROCEPHIN) 1 g IVPB Stopped (05/10/17 1009)  . clindamycin (CLEOCIN) IV    . heparin 1,150 Units/hr (05/10/17 0823)   . albuterol  2.5 mg Nebulization Q4H  . amiodarone  200 mg Oral BID  . azithromycin  250 mg Oral Daily  . budesonide (PULMICORT) nebulizer solution  0.5 mg Nebulization BID  . docusate sodium  100 mg Oral BID  . feeding supplement (ENSURE ENLIVE)  237 mL Oral TID BM  . ferrous sulfate  325 mg Oral Q breakfast  . fluticasone  1 spray Each Nare Daily  . guaiFENesin  1,200 mg Oral BID  . mometasone-formoterol  2 puff Inhalation BID  . multivitamin with minerals  1 tablet Oral Daily  . pantoprazole  40 mg Oral  Daily  . phytonadione  3 mg Oral Daily  . polyethylene glycol  17 g Oral Daily  . predniSONE  20 mg Oral Q breakfast  . senna  1 tablet Oral BID  . tiotropium  18 mcg Inhalation Daily   acetaminophen **OR** acetaminophen, bisacodyl, HYDROcodone-acetaminophen, metoCLOPramide (REGLAN) injection, morphine injection, ondansetron **OR** ondansetron (ZOFRAN) IV, pneumococcal 23 valent vaccine, senna  Assessment/ Plan:  Ms. JALIANA MEDELLIN is a 83 y.o. black female with Chronic kidney disease stage III baseline EGFR 52, congestive heart failure, hyperlipidemia, disease status post CABG, anemia of chronic kidney disease, chronic atrial fibrillation, severe mitral regurgitation, hypertension, GERD, COPD, who was admitted to [facility]  on 04/15/2017 for evaluation of Fall.  Patient developed left hip fracture and also had Atrial fibrillation with RVR and acute renal failure.  1. Acute renal failure on chronic kidney disease stage III: baseline creatinine 1.18, GFR of 52 from 04/01/17 Oliguric urine output.  - Discontinue IV fluids - Hemodialysis treatment on 7/24 first treatment. Seen and examined on hemodialysis   2. Anemia of chronic kidney disease. Hemoglobin 7.9 - PRBC transfusion with hemodialysis treatment  - iron supplements  3. Hypotension with atrial fibrillation with rapid ventricular response:  - on amiodarone - Appreciate cardiology input. Monitor for exacerbation of congestive heart failure.   4. Acute exacerbation of COPD: cough and wheezing on examination - on ceftriaxone, clindamycin and azithromycin. Nebulizer treatments and supportive care  5. Coagulopathy: INR 1.33  6. Left Hip fracture: surgery scheduled for tomorrow.    LOS: 8 Criss Pallone 7/26/201812:05 PM

## 2017-05-10 NOTE — Progress Notes (Signed)
End of transfusion

## 2017-05-10 NOTE — Progress Notes (Signed)
ANTICOAGULATION CONSULT NOTE -  Pharmacy Consult for Heparin Drip/Bridging   Indication: atrial fibrillation  Allergies  Allergen Reactions  . Penicillins Itching and Rash    Other reaction(s): RASH  . Enalapril Other (See Comments)    cough  . Statins Other (See Comments)    Other reaction(s): MUSCLE PAIN    Patient Measurements: Height: 5' 8.5" (174 cm) Weight: 147 lb 11.3 oz (67 kg) IBW/kg (Calculated) : 65.05 Vital Signs: Temp: 97.7 F (36.5 C) (07/26 0407) Temp Source: Oral (07/26 0407) BP: 94/51 (07/26 0407) Pulse Rate: 105 (07/26 0407)  Labs:  Recent Labs  05/08/17 0552 05/08/17 1058 05/09/17 0655 05/09/17 0815 05/09/17 1658 05/10/17 0306  HGB  --  8.3*  --   --   --  7.9*  HCT  --  24.3*  --   --   --  23.6*  PLT  --  230  --   --   --  202  APTT  --   --   --  41*  --   --   LABPROT 27.3*  --  19.5*  --   --  16.6*  INR 2.48  --  1.63  --   --  1.33  HEPARINUNFRC  --   --   --   --  0.25* <0.10*  CREATININE 3.59*  --  3.07*  --   --  2.48*    Estimated Creatinine Clearance: 16.1 mL/min (A) (by C-G formula based on SCr of 2.48 mg/dL (H)).   Medical History: Past Medical History:  Diagnosis Date  . A-fib (HCC)    a. on Coumadin; b. CHADS2VASc => 5 (CHF, age x 2, vascular disease, female)  . Chronic systolic CHF (congestive heart failure) (HCC)   . Coronary artery disease    a. s/p CABG w/ SVG-PRDA  . S/P mitral valve clip implantation   . Status post aortic valve replacement     Assessment: 83 yo female with PMH of A.Fib, admitted with left femoral neck fracture secondary to fall. Patient takes warfarin, which has been placed on hold.  Pharmacy consulted for heparin dosing for bridge therapy for orthopedic surgery.   7/18 INR: 3.59 (Vit K 0.2 mg PO) 7/19 INR: 4.24 (Vit K 0.2 mg PO) 7/20 INR 3.29 (Vit K 1mg  PO) 7/21 INR 3.50 (Vit K 1mg  PO) 7/22 INR 3.30 (Vit K 1mg  PO) 7/23 INR 2.85 (Vit K 1 mg PO) 7/24: INR: 2.48  (Vit K 2.5 mg PO) 7/25:  INR: 1.63   Goal of Therapy:  INR <2 Heparin level 0.3-0.7 units/ml Monitor platelets by anticoagulation protocol: Yes  Plan:  Spoke with MD Elisabeth PigeonVachhani and he would like to start Heparin gtt since INR is now < 2. Will initiate Heparin gtt @ 800 units/hr. 1st Heparin level to be drawn @ 1630 on 7/25.   7/25: HL@ 1658= 0.25. Will give Heparin bolus of 1000 units and increase drip to 950 units/hr. Next Heparin level in 8 hours.  7/26 0300 heparin level <0.1. Drip infusing per RN. 2000 unit bolus and increase rate to 1150 units/hr. Recheck in 8 hours.  Erich MontaneMcBane,Iyauna Sing S, PharmD, BCPS Clinical Pharmacist 05/10/2017 4:51 AM

## 2017-05-10 NOTE — Progress Notes (Signed)
Pt returned from Dialysis, rt groin site dry and intact.  Received 1 unit PRBC's in Dialysis.  No distress on 2LO2 per [place].  Daughters at bedside

## 2017-05-10 NOTE — Progress Notes (Signed)
Pre hd info 

## 2017-05-10 NOTE — Progress Notes (Signed)
ANTICOAGULATION CONSULT NOTE -  Pharmacy Consult for Heparin Drip/Bridging   Indication: atrial fibrillation  Allergies  Allergen Reactions  . Penicillins Itching and Rash    Other reaction(s): RASH  . Enalapril Other (See Comments)    cough  . Statins Other (See Comments)    Other reaction(s): MUSCLE PAIN    Patient Measurements: Height: 5' 8.5" (174 cm) Weight: 155 lb 10.3 oz (70.6 kg) IBW/kg (Calculated) : 65.05 Vital Signs: Temp: 98.1 F (36.7 C) (07/26 1400) Temp Source: Oral (07/26 1400) BP: 94/53 (07/26 1443) Pulse Rate: 94 (07/26 1443)  Labs:  Recent Labs  05/08/17 0552 05/08/17 1058 05/09/17 0655 05/09/17 0815 05/09/17 1658 05/10/17 0306 05/10/17 1615  HGB  --  8.3*  --   --   --  7.9*  --   HCT  --  24.3*  --   --   --  23.6*  --   PLT  --  230  --   --   --  202  --   APTT  --   --   --  41*  --   --   --   LABPROT 27.3*  --  19.5*  --   --  16.6*  --   INR 2.48  --  1.63  --   --  1.33  --   HEPARINUNFRC  --   --   --   --  0.25* <0.10* 0.29*  CREATININE 3.59*  --  3.07*  --   --  2.48*  --     Estimated Creatinine Clearance: 16.1 mL/min (A) (by C-G formula based on SCr of 2.48 mg/dL (H)).   Medical History: Past Medical History:  Diagnosis Date  . A-fib (HCC)    a. on Coumadin; b. CHADS2VASc => 5 (CHF, age x 2, vascular disease, female)  . Chronic systolic CHF (congestive heart failure) (HCC)   . Coronary artery disease    a. s/p CABG w/ SVG-PRDA  . S/P mitral valve clip implantation   . Status post aortic valve replacement     Assessment: 83 yo female with PMH of A.Fib, admitted with left femoral neck fracture secondary to fall. Patient takes warfarin, which has been placed on hold.  Pharmacy consulted for heparin dosing for bridge therapy for orthopedic surgery.   7/18 INR: 3.59 (Vit K 0.2 mg PO) 7/19 INR: 4.24 (Vit K 0.2 mg PO) 7/20 INR 3.29 (Vit K 1mg  PO) 7/21 INR 3.50 (Vit K 1mg  PO) 7/22 INR 3.30 (Vit K 1mg  PO) 7/23 INR 2.85 (Vit  K 1 mg PO) 7/24: INR: 2.48  (Vit K 2.5 mg PO) 7/25: INR: 1.63 (Vit K 3 mg PO) 7/26: INR: 1.33 (Vit K 3 mg PO)   Goal of Therapy:  INR <2 Heparin level 0.3-0.7 units/ml Monitor platelets by anticoagulation protocol: Yes  Plan:  HL = 0.29 which is slightly subtherapeutic. Confirmed with RN that there have been no interruptions in infusion. Will give 1000 units bolus and increase infusion to 1200 units/hr. CBC to be ordered with AM labs. HL to be checked in 8 hours.  Cindi CarbonMary M Zein Helbing, PharmD, BCPS Clinical Pharmacist 05/10/2017 5:34 PM

## 2017-05-10 NOTE — Progress Notes (Signed)
Pre hd assessment  

## 2017-05-10 NOTE — Progress Notes (Signed)
To Dialysis via bed 

## 2017-05-10 NOTE — Progress Notes (Signed)
Post hd vitals 

## 2017-05-10 NOTE — Progress Notes (Signed)
Post hd assessment 

## 2017-05-10 NOTE — Plan of Care (Signed)
Problem: Pain Managment: Goal: General experience of comfort will improve Outcome: Not Progressing Prn medications  Problem: Physical Regulation: Goal: Will remain free from infection Outcome: Not Progressing Remains on IV antibiotics  Problem: Tissue Perfusion: Goal: Risk factors for ineffective tissue perfusion will decrease Outcome: Progressing Heparin gtt infusing  Problem: Cardiac: Goal: Ability to achieve and maintain adequate cardiopulmonary perfusion will improve Outcome: Progressing Remains in Afib, PO Amniodarone

## 2017-05-10 NOTE — Progress Notes (Signed)
Transfusion start

## 2017-05-10 NOTE — Progress Notes (Signed)
SLP Cancellation Note  Patient Details Name: Cindy Weaver MRN: [id number] DOB: 11/11/1925   Cancelled treatment:       Reason Eval/Treat Not Completed: Patient at procedure or test/unavailable. Pt is currently out of her room for dialysis. Nsg reports that pt has been tolerating her Dys II diet w/honey thick liquids well, but that her appetite has not been good. ST unable to complete swallowing treatment d/t pt out of room, will f/u at a later date/time as pt is available.    Early,Maaran 05/10/2017, 12:01 PM

## 2017-05-11 ENCOUNTER — Inpatient Hospital Stay: Payer: Medicare Other

## 2017-05-11 ENCOUNTER — Inpatient Hospital Stay: Payer: Medicare Other | Admitting: Anesthesiology

## 2017-05-11 ENCOUNTER — Encounter: Admission: EM | Disposition: E | Payer: Self-pay | Source: Home / Self Care | Attending: Internal Medicine

## 2017-05-11 HISTORY — PX: HIP ARTHROPLASTY: SHX981

## 2017-05-11 LAB — BASIC METABOLIC PANEL
Anion gap: 8 (ref 5–15)
BUN: 35 mg/dL — AB (ref 6–20)
CALCIUM: 8.1 mg/dL — AB (ref 8.9–10.3)
CHLORIDE: 103 mmol/L (ref 101–111)
CO2: 29 mmol/L (ref 22–32)
CREATININE: 1.87 mg/dL — AB (ref 0.44–1.00)
GFR, EST AFRICAN AMERICAN: 27 mL/min — AB (ref >60)
GFR, EST NON AFRICAN AMERICAN: 23 mL/min — AB (ref >60)
Glucose, Bld: 102 mg/dL — ABNORMAL HIGH (ref 65–99)
Potassium: 4 mmol/L (ref 3.5–5.1)
SODIUM: 140 mmol/L (ref 135–145)

## 2017-05-11 LAB — PROTIME-INR
INR: 1.38
PROTHROMBIN TIME: 17.1 s — AB (ref 11.4–15.2)

## 2017-05-11 LAB — PROTEIN ELECTROPHORESIS, SERUM
A/G RATIO SPE: 0.8 (ref 0.7–1.7)
ALPHA-2-GLOBULIN: 0.8 g/dL (ref 0.4–1.0)
Albumin ELP: 2.2 g/dL — ABNORMAL LOW (ref 2.9–4.4)
Alpha-1-Globulin: 0.3 g/dL (ref 0.0–0.4)
BETA GLOBULIN: 0.7 g/dL (ref 0.7–1.3)
Gamma Globulin: 1 g/dL (ref 0.4–1.8)
Globulin, Total: 2.9 g/dL (ref 2.2–3.9)
Total Protein ELP: 5.1 g/dL — ABNORMAL LOW (ref 6.0–8.5)

## 2017-05-11 LAB — PROTEIN ELECTRO, RANDOM URINE
ALBUMIN ELP UR: 41.6 %
ALPHA-1-GLOBULIN, U: 10.6 %
ALPHA-2-GLOBULIN, U: 5.8 %
BETA GLOBULIN, U: 11.2 %
GAMMA GLOBULIN, U: 30.8 %
M Component, Ur: 8 % — ABNORMAL HIGH
TOTAL PROTEIN, URINE-UPE24: 101.1 mg/dL

## 2017-05-11 LAB — CBC
HCT: 27.1 % — ABNORMAL LOW (ref 35.0–47.0)
HEMOGLOBIN: 9.1 g/dL — AB (ref 12.0–16.0)
MCH: 30.4 pg (ref 26.0–34.0)
MCHC: 33.7 g/dL (ref 32.0–36.0)
MCV: 90 fL (ref 80.0–100.0)
PLATELETS: 194 10*3/uL (ref 150–440)
RBC: 3.01 MIL/uL — ABNORMAL LOW (ref 3.80–5.20)
RDW: 17.8 % — ABNORMAL HIGH (ref 11.5–14.5)
WBC: 14.6 10*3/uL — ABNORMAL HIGH (ref 3.6–11.0)

## 2017-05-11 LAB — GLUCOSE, CAPILLARY: Glucose-Capillary: 83 mg/dL (ref 65–99)

## 2017-05-11 LAB — PREPARE RBC (CROSSMATCH)

## 2017-05-11 SURGERY — HEMIARTHROPLASTY, HIP, DIRECT ANTERIOR APPROACH, FOR FRACTURE
Anesthesia: Spinal | Site: Hip | Laterality: Left | Wound class: Clean

## 2017-05-11 MED ORDER — WARFARIN SODIUM 5 MG PO TABS
5.0000 mg | ORAL_TABLET | Freq: Once | ORAL | Status: AC
  Administered 2017-05-11: 5 mg via ORAL
  Filled 2017-05-11 (×2): qty 1

## 2017-05-11 MED ORDER — PHENYLEPHRINE HCL 10 MG/ML IJ SOLN
INTRAMUSCULAR | Status: DC | PRN
  Administered 2017-05-11: 10 mg

## 2017-05-11 MED ORDER — METOCLOPRAMIDE HCL 5 MG/ML IJ SOLN
5.0000 mg | Freq: Three times a day (TID) | INTRAMUSCULAR | Status: DC | PRN
  Filled 2017-05-11: qty 2

## 2017-05-11 MED ORDER — ONDANSETRON HCL 4 MG PO TABS: 4.0000 mg | ORAL_TABLET | Freq: Four times a day (QID) | ORAL | Status: DC | PRN

## 2017-05-11 MED ORDER — PROPOFOL 10 MG/ML IV BOLUS
INTRAVENOUS | Status: DC | PRN
  Administered 2017-05-11: 20 mg via INTRAVENOUS

## 2017-05-11 MED ORDER — FUROSEMIDE 10 MG/ML IJ SOLN
INTRAMUSCULAR | Status: AC
  Administered 2017-05-11: 5 mg via INTRAVENOUS
  Filled 2017-05-11: qty 2

## 2017-05-11 MED ORDER — MENTHOL 3 MG MT LOZG
1.0000 | LOZENGE | OROMUCOSAL | Status: DC | PRN
Start: 2017-05-11 — End: 2017-05-16
  Filled 2017-05-11: qty 9

## 2017-05-11 MED ORDER — PHENOL 1.4 % MT LIQD
1.0000 | OROMUCOSAL | Status: DC | PRN
  Filled 2017-05-11: qty 177

## 2017-05-11 MED ORDER — PROPOFOL 10 MG/ML IV BOLUS
INTRAVENOUS | Status: AC
  Filled 2017-05-11: qty 20

## 2017-05-11 MED ORDER — FUROSEMIDE 10 MG/ML IJ SOLN
INTRAMUSCULAR | Status: AC
  Administered 2017-05-11: 40 mg via INTRAVENOUS
  Filled 2017-05-11: qty 4

## 2017-05-11 MED ORDER — LIDOCAINE HCL (PF) 2 % IJ SOLN
INTRAMUSCULAR | Status: AC
  Filled 2017-05-11: qty 2

## 2017-05-11 MED ORDER — PHENYLEPHRINE HCL 10 MG/ML IJ SOLN
INTRAMUSCULAR | Status: DC | PRN
  Administered 2017-05-11 (×3): 100 ug via INTRAVENOUS

## 2017-05-11 MED ORDER — KETAMINE HCL 10 MG/ML IJ SOLN
INTRAMUSCULAR | Status: DC | PRN
  Administered 2017-05-11: 25 mg via INTRAVENOUS

## 2017-05-11 MED ORDER — ONDANSETRON HCL 4 MG/2ML IJ SOLN: 4.0000 mg | Freq: Once | INTRAMUSCULAR | Status: DC | PRN

## 2017-05-11 MED ORDER — OXYCODONE HCL 5 MG PO TABS
5.0000 mg | ORAL_TABLET | ORAL | Status: DC | PRN
  Administered 2017-05-14: 10 mg via ORAL
  Filled 2017-05-11 (×3): qty 1

## 2017-05-11 MED ORDER — BUPIVACAINE HCL (PF) 0.5 % IJ SOLN
INTRAMUSCULAR | Status: AC
  Filled 2017-05-11: qty 10

## 2017-05-11 MED ORDER — METOCLOPRAMIDE HCL 5 MG PO TABS
5.0000 mg | ORAL_TABLET | Freq: Three times a day (TID) | ORAL | Status: DC | PRN
  Administered 2017-05-14: 5 mg via ORAL
  Filled 2017-05-11 (×2): qty 2

## 2017-05-11 MED ORDER — FUROSEMIDE 10 MG/ML IJ SOLN
40.0000 mg | Freq: Once | INTRAMUSCULAR | Status: AC
  Administered 2017-05-12: 40 mg via INTRAVENOUS
  Filled 2017-05-11: qty 4

## 2017-05-11 MED ORDER — FLEET ENEMA 7-19 GM/118ML RE ENEM: 1.0000 | ENEMA | Freq: Once | RECTAL | Status: DC | PRN

## 2017-05-11 MED ORDER — ACETAMINOPHEN 650 MG RE SUPP: 650.0000 mg | Freq: Four times a day (QID) | RECTAL | Status: DC | PRN

## 2017-05-11 MED ORDER — SODIUM CHLORIDE 0.9 % IV SOLN: INTRAVENOUS | Status: DC

## 2017-05-11 MED ORDER — PHENYLEPHRINE HCL 10 MG/ML IJ SOLN
INTRAMUSCULAR | Status: AC
  Filled 2017-05-11: qty 1

## 2017-05-11 MED ORDER — FENTANYL CITRATE (PF) 100 MCG/2ML IJ SOLN
INTRAMUSCULAR | Status: AC
  Filled 2017-05-11: qty 2

## 2017-05-11 MED ORDER — MORPHINE SULFATE (PF) 2 MG/ML IV SOLN
2.0000 mg | INTRAVENOUS | Status: DC | PRN
  Administered 2017-05-14 (×2): 2 mg via INTRAVENOUS
  Filled 2017-05-11 (×2): qty 1

## 2017-05-11 MED ORDER — EPINEPHRINE PF 1 MG/ML IJ SOLN
INTRAMUSCULAR | Status: DC | PRN
  Administered 2017-05-11: .0001 mL via INTRAVENOUS

## 2017-05-11 MED ORDER — PHENYLEPHRINE HCL 10 MG/ML IJ SOLN
INTRAMUSCULAR | Status: DC | PRN
  Administered 2017-05-11: 30 ug/min via INTRAVENOUS

## 2017-05-11 MED ORDER — FUROSEMIDE 10 MG/ML IJ SOLN
10.0000 mg | Freq: Once | INTRAMUSCULAR | Status: AC
  Administered 2017-05-11: 10 mg via INTRAVENOUS

## 2017-05-11 MED ORDER — FENTANYL CITRATE (PF) 100 MCG/2ML IJ SOLN
INTRAMUSCULAR | Status: AC
  Administered 2017-05-11: 25 ug via INTRAVENOUS
  Filled 2017-05-11: qty 2

## 2017-05-11 MED ORDER — TRAMADOL HCL 50 MG PO TABS
50.0000 mg | ORAL_TABLET | ORAL | Status: DC | PRN
  Administered 2017-05-17: 100 mg via ORAL
  Administered 2017-05-17 (×2): 50 mg via ORAL
  Administered 2017-05-18: 100 mg via ORAL
  Filled 2017-05-11: qty 1
  Filled 2017-05-11 (×2): qty 2
  Filled 2017-05-11: qty 1

## 2017-05-11 MED ORDER — CLINDAMYCIN PHOSPHATE 600 MG/50ML IV SOLN
600.0000 mg | Freq: Four times a day (QID) | INTRAVENOUS | Status: AC
  Administered 2017-05-11 (×2): 600 mg via INTRAVENOUS
  Filled 2017-05-11 (×3): qty 50

## 2017-05-11 MED ORDER — ROCURONIUM BROMIDE 50 MG/5ML IV SOLN
INTRAVENOUS | Status: AC
  Filled 2017-05-11: qty 1

## 2017-05-11 MED ORDER — WARFARIN - PHARMACIST DOSING INPATIENT
Freq: Every day | Status: DC
  Administered 2017-05-11: 18:00:00

## 2017-05-11 MED ORDER — SODIUM CHLORIDE 0.9 % IV SOLN: Freq: Once | INTRAVENOUS | Status: DC

## 2017-05-11 MED ORDER — PROPOFOL 500 MG/50ML IV EMUL
INTRAVENOUS | Status: DC | PRN
  Administered 2017-05-11: 10 ug/kg/min via INTRAVENOUS

## 2017-05-11 MED ORDER — ACETAMINOPHEN 10 MG/ML IV SOLN
INTRAVENOUS | Status: AC
  Administered 2017-05-11: 1000 mg via INTRAVENOUS
  Filled 2017-05-11: qty 100

## 2017-05-11 MED ORDER — FUROSEMIDE 10 MG/ML IJ SOLN
5.0000 mg | Freq: Once | INTRAMUSCULAR | Status: AC
  Administered 2017-05-11: 5 mg via INTRAVENOUS

## 2017-05-11 MED ORDER — DEXAMETHASONE SODIUM PHOSPHATE 10 MG/ML IJ SOLN
INTRAMUSCULAR | Status: AC
  Filled 2017-05-11: qty 1

## 2017-05-11 MED ORDER — BISACODYL 10 MG RE SUPP
10.0000 mg | Freq: Every day | RECTAL | Status: DC | PRN
  Administered 2017-05-17: 10 mg via RECTAL
  Filled 2017-05-11: qty 1

## 2017-05-11 MED ORDER — EPINEPHRINE PF 1 MG/ML IJ SOLN
INTRAMUSCULAR | Status: AC
  Filled 2017-05-11: qty 1

## 2017-05-11 MED ORDER — METOCLOPRAMIDE HCL 10 MG PO TABS
10.0000 mg | ORAL_TABLET | Freq: Three times a day (TID) | ORAL | Status: AC
  Administered 2017-05-11 – 2017-05-13 (×8): 10 mg via ORAL
  Filled 2017-05-11 (×10): qty 1

## 2017-05-11 MED ORDER — VASOPRESSIN 20 UNIT/ML IV SOLN
INTRAVENOUS | Status: AC
  Filled 2017-05-11: qty 1

## 2017-05-11 MED ORDER — BUPIVACAINE HCL (PF) 0.5 % IJ SOLN
INTRAMUSCULAR | Status: DC | PRN
  Administered 2017-05-11: 2.5 mL via INTRATHECAL

## 2017-05-11 MED ORDER — TETRACAINE HCL 1 % IJ SOLN
INTRAMUSCULAR | Status: DC | PRN
  Administered 2017-05-11: 2 mg via INTRASPINAL

## 2017-05-11 MED ORDER — NEOMYCIN-POLYMYXIN B GU 40-200000 IR SOLN
Status: DC | PRN
  Administered 2017-05-11: 16 mL

## 2017-05-11 MED ORDER — ONDANSETRON HCL 4 MG/2ML IJ SOLN
INTRAMUSCULAR | Status: AC
  Filled 2017-05-11: qty 2

## 2017-05-11 MED ORDER — SENNOSIDES-DOCUSATE SODIUM 8.6-50 MG PO TABS
1.0000 | ORAL_TABLET | Freq: Two times a day (BID) | ORAL | Status: DC
  Administered 2017-05-11 – 2017-05-14 (×6): 1 via ORAL
  Filled 2017-05-11 (×7): qty 1

## 2017-05-11 MED ORDER — EPHEDRINE SULFATE 50 MG/ML IJ SOLN
INTRAMUSCULAR | Status: AC
  Filled 2017-05-11: qty 1

## 2017-05-11 MED ORDER — ACETAMINOPHEN 325 MG PO TABS
650.0000 mg | ORAL_TABLET | Freq: Four times a day (QID) | ORAL | Status: DC | PRN
  Administered 2017-05-16: 650 mg via ORAL
  Filled 2017-05-11: qty 2

## 2017-05-11 MED ORDER — SODIUM CHLORIDE 0.9 % IJ SOLN
INTRAMUSCULAR | Status: AC
  Filled 2017-05-11: qty 10

## 2017-05-11 MED ORDER — AZITHROMYCIN 250 MG PO TABS
250.0000 mg | ORAL_TABLET | Freq: Every day | ORAL | Status: AC
  Administered 2017-05-11: 250 mg via ORAL
  Filled 2017-05-11 (×3): qty 1

## 2017-05-11 MED ORDER — ONDANSETRON HCL 4 MG/2ML IJ SOLN: 4.0000 mg | Freq: Four times a day (QID) | INTRAMUSCULAR | Status: DC | PRN

## 2017-05-11 MED ORDER — LIDOCAINE HCL (PF) 2 % IJ SOLN
INTRAMUSCULAR | Status: DC | PRN
  Administered 2017-05-11: 50 mg

## 2017-05-11 MED ORDER — CLINDAMYCIN PHOSPHATE 600 MG/50ML IV SOLN
INTRAVENOUS | Status: DC | PRN
  Administered 2017-05-11: 600 mg via INTRAVENOUS

## 2017-05-11 MED ORDER — MAGNESIUM HYDROXIDE 400 MG/5ML PO SUSP
30.0000 mL | Freq: Every day | ORAL | Status: DC | PRN
  Administered 2017-05-17: 30 mL via ORAL
  Filled 2017-05-11: qty 30

## 2017-05-11 MED ORDER — FENTANYL CITRATE (PF) 100 MCG/2ML IJ SOLN
25.0000 ug | INTRAMUSCULAR | Status: DC | PRN
  Administered 2017-05-11 (×3): 25 ug via INTRAVENOUS

## 2017-05-11 MED ORDER — ACETAMINOPHEN 10 MG/ML IV SOLN
1000.0000 mg | Freq: Four times a day (QID) | INTRAVENOUS | Status: AC
  Administered 2017-05-11 – 2017-05-12 (×4): 1000 mg via INTRAVENOUS
  Filled 2017-05-11 (×4): qty 100

## 2017-05-11 MED ORDER — FUROSEMIDE 10 MG/ML IJ SOLN
40.0000 mg | Freq: Once | INTRAMUSCULAR | Status: AC
  Administered 2017-05-11: 40 mg via INTRAVENOUS

## 2017-05-11 MED ORDER — SUCCINYLCHOLINE CHLORIDE 20 MG/ML IJ SOLN
INTRAMUSCULAR | Status: AC
  Filled 2017-05-11: qty 1

## 2017-05-11 MED ORDER — KETAMINE HCL 50 MG/ML IJ SOLN
INTRAMUSCULAR | Status: AC
  Filled 2017-05-11: qty 10

## 2017-05-11 SURGICAL SUPPLY — 62 items
BAG DECANTER FOR FLEXI CONT (MISCELLANEOUS) ×3 IMPLANT
BLADE SAW 1 (BLADE) ×3 IMPLANT
CANISTER SUCT 1200ML W/VALVE (MISCELLANEOUS) ×3 IMPLANT
CANISTER SUCT 3000ML PPV (MISCELLANEOUS) ×6 IMPLANT
CAPT HIP HEMI 1 ×3 IMPLANT
CATH FOL LEG HOLDER (MISCELLANEOUS) ×3 IMPLANT
CEMENT HV SMART SET (Cement) ×6 IMPLANT
DRAPE INCISE IOBAN 66X60 STRL (DRAPES) ×3 IMPLANT
DRAPE SHEET LG 3/4 BI-LAMINATE (DRAPES) ×3 IMPLANT
DRAPE TABLE BACK 80X90 (DRAPES) ×3 IMPLANT
DRSG DERMACEA 8X12 NADH (GAUZE/BANDAGES/DRESSINGS) ×3 IMPLANT
DRSG OPSITE POSTOP 4X12 (GAUZE/BANDAGES/DRESSINGS) IMPLANT
DRSG OPSITE POSTOP 4X14 (GAUZE/BANDAGES/DRESSINGS) IMPLANT
DRSG TEGADERM 4X4.75 (GAUZE/BANDAGES/DRESSINGS) ×3 IMPLANT
DURAPREP 26ML APPLICATOR (WOUND CARE) ×6 IMPLANT
ELECT BLADE 6.5 EXT (BLADE) ×3 IMPLANT
ELECT CAUTERY BLADE 6.4 (BLADE) ×3 IMPLANT
ELECT REM PT RETURN 9FT ADLT (ELECTROSURGICAL) ×3
ELECTRODE REM PT RTRN 9FT ADLT (ELECTROSURGICAL) ×1 IMPLANT
GAUZE PACK 2X3YD (MISCELLANEOUS) ×3 IMPLANT
GLOVE BIO SURGEON STRL SZ7.5 (GLOVE) ×3 IMPLANT
GLOVE BIO SURGEON STRL SZ8 (GLOVE) ×3 IMPLANT
GLOVE BIOGEL M STRL SZ7.5 (GLOVE) ×6 IMPLANT
GLOVE BIOGEL PI IND STRL 8 (GLOVE) ×1 IMPLANT
GLOVE BIOGEL PI IND STRL 9 (GLOVE) ×1 IMPLANT
GLOVE BIOGEL PI INDICATOR 8 (GLOVE) ×2
GLOVE BIOGEL PI INDICATOR 9 (GLOVE) ×2
GLOVE INDICATOR 8.0 STRL GRN (GLOVE) ×3 IMPLANT
GOWN STRL REUS W/ TWL LRG LVL3 (GOWN DISPOSABLE) ×3 IMPLANT
GOWN STRL REUS W/TWL 2XL LVL3 (GOWN DISPOSABLE) ×3 IMPLANT
GOWN STRL REUS W/TWL LRG LVL3 (GOWN DISPOSABLE) ×6
HEMOVAC 400CC 10FR (MISCELLANEOUS) IMPLANT
HOOD PEEL AWAY FLYTE STAYCOOL (MISCELLANEOUS) ×6 IMPLANT
IV NS 100ML SINGLE PACK (IV SOLUTION) ×3 IMPLANT
KIT RM TURNOVER STRD PROC AR (KITS) ×3 IMPLANT
NDL SAFETY 18GX1.5 (NEEDLE) ×3 IMPLANT
NEEDLE FILTER BLUNT 18X 1/2SAF (NEEDLE) ×2
NEEDLE FILTER BLUNT 18X1 1/2 (NEEDLE) ×1 IMPLANT
NS IRRIG 1000ML POUR BTL (IV SOLUTION) ×3 IMPLANT
PACK HIP PROSTHESIS (MISCELLANEOUS) ×3 IMPLANT
PRESSURIZER CEMENT PROX FEM SM (MISCELLANEOUS) ×3 IMPLANT
PRESSURIZER FEM CANAL M (MISCELLANEOUS) ×3 IMPLANT
PREVENA INCISION MGT 90 150 (MISCELLANEOUS) ×3 IMPLANT
PULSAVAC PLUS IRRIG FAN TIP (DISPOSABLE) ×3
SOL .9 NS 3000ML IRR  AL (IV SOLUTION) ×2
SOL .9 NS 3000ML IRR UROMATIC (IV SOLUTION) ×1 IMPLANT
SOL PREP PVP 2OZ (MISCELLANEOUS)
SOLUTION PREP PVP 2OZ (MISCELLANEOUS) IMPLANT
SPONGE DRAIN TRACH 4X4 STRL 2S (GAUZE/BANDAGES/DRESSINGS) IMPLANT
STAPLER SKIN PROX 35W (STAPLE) ×3 IMPLANT
SUT ETHIBOND #5 BRAIDED 30INL (SUTURE) ×3 IMPLANT
SUT VIC AB 0 CT1 36 (SUTURE) ×3 IMPLANT
SUT VIC AB 1 CT1 36 (SUTURE) ×6 IMPLANT
SUT VIC AB 2-0 CT1 27 (SUTURE) ×2
SUT VIC AB 2-0 CT1 TAPERPNT 27 (SUTURE) ×1 IMPLANT
SYR 20CC LL (SYRINGE) ×3 IMPLANT
SYR TB 1ML LUER SLIP (SYRINGE) ×3 IMPLANT
TAPE TRANSPORE STRL 2 31045 (GAUZE/BANDAGES/DRESSINGS) ×3 IMPLANT
TIP BRUSH PULSAVAC PLUS 24.33 (MISCELLANEOUS) ×3 IMPLANT
TIP FAN IRRIG PULSAVAC PLUS (DISPOSABLE) ×1 IMPLANT
TOWER CARTRIDGE SMART MIX (DISPOSABLE) ×3 IMPLANT
WND VAC CANISTER 500ML (MISCELLANEOUS) ×3 IMPLANT

## 2017-05-11 NOTE — Transfer of Care (Signed)
Immediate Anesthesia Transfer of Care Note  Patient: Cindy Weaver  Procedure(s) Performed: Procedure(s): ARTHROPLASTY BIPOLAR HIP (HEMIARTHROPLASTY) (Left)  Patient Location: PACU  Anesthesia Type:Spinal  Level of Consciousness: awake and alert   Airway & Oxygen Therapy: Patient Spontanous Breathing and Patient connected to face mask oxygen  Post-op Assessment: Report given to RN and Post -op Vital signs reviewed and stable  Post vital signs: Reviewed  Last Vitals:  Vitals:   04/26/2017 1044 05/04/2017 1047  BP: (!) 100/58 (!) 100/58  Pulse:  91  Resp: 14 19  Temp: (!) 36.2 C 36.5 C    Last Pain:  Vitals:   04/21/2017 0621  TempSrc:   PainSc: Asleep         Complications: No apparent anesthesia complications

## 2017-05-11 NOTE — Care Management (Signed)
Patient placed in icu stepdown after hemiarthroplasty due to post operative hypoxia.  PPD has been read and documented.  Notified Dimas ChyleAmanda Morris with [facility] [employer] that patient now in icu

## 2017-05-11 NOTE — Progress Notes (Signed)
ANTICOAGULATION CONSULT NOTE - Initial Consult  Pharmacy Consult for warfarin Indication: VTE prophylaxis and warfarin  Allergies  Allergen Reactions  . Penicillins Itching and Rash    Other reaction(s): RASH  . Enalapril Other (See Comments)    cough  . Statins Other (See Comments)    Other reaction(s): MUSCLE PAIN    Patient Measurements: Height: 5' 8.5" (174 cm) Weight: 153 lb 12.8 oz (69.8 kg) IBW/kg (Calculated) : 65.05  Vital Signs: Temp: 97.8 F (36.6 C) (07/27 1700) Temp Source: Oral (07/27 1700) BP: 105/61 (07/27 1700) Pulse Rate: 102 (07/27 1703)  Labs:  Recent Labs  05/09/17 [id number] 05/09/17 0815 05/09/17 1658 05/10/17 0306 05/10/17 1615 05/08/2017 0210  HGB  --   --   --  7.9*  --  9.1*  HCT  --   --   --  23.6*  --  27.1*  PLT  --   --   --  202  --  194  APTT  --  41*  --   --   --   --   LABPROT 19.5*  --   --  16.6*  --  17.1*  INR 1.63  --   --  1.33  --  1.38  HEPARINUNFRC  --   --  0.25* <0.10* 0.29*  --   CREATININE 3.07*  --   --  2.48*  --  1.87*    Estimated Creatinine Clearance: 21.4 mL/min (A) (by C-G formula based on SCr of 1.87 mg/dL (H)).   Medical History: Past Medical History:  Diagnosis Date  . A-fib (HCC)    a. on Coumadin; b. CHADS2VASc => 5 (CHF, age x 2, vascular disease, female)  . Chronic systolic CHF (congestive heart failure) (HCC)   . Coronary artery disease    a. s/p CABG w/ SVG-PRDA  . S/P mitral valve clip implantation   . Status post aortic valve replacement     Assessment: Patient was taking warfarin PTA which is now being resumed post-op. Patient underwent surgery for femoral neck fracture today.   Warfarin has been held for the past 9 days and patient has received phytonadione the past 9 days. Patient was bridged with heparin drip prior to surgery.   Home dose = warfarin 2.5 mg PO daily INR today = 1.38  Goal of Therapy:  INR 2-3 Monitor platelets by anticoagulation protocol: Yes   Plan:  Give  warfarin 5 mg PO this evening. Will check INR with AM labs tomorrow.  Cindi CarbonMary M Linna Thebeau, PharmD Clinical Pharmacist 04/23/2017,5:42 PM

## 2017-05-11 NOTE — Anesthesia Post-op Follow-up Note (Cosign Needed)
Anesthesia QCDR form completed.        

## 2017-05-11 NOTE — Op Note (Signed)
OPERATIVE NOTE  DATE OF SURGERY:  04/29/2017  PATIENT NAME:  Prudencio PairLiller W Oxendine   DOB: 02/25/1926  MRN: [id number]  PRE-OPERATIVE DIAGNOSIS: Left femoral neck fracture  POST-OPERATIVE DIAGNOSIS:  Same  PROCEDURE:  Left hip hemiarthroplasty  SURGEON:  Jena GaussJames P Valerian Jewel, Jr. M.D.  ANESTHESIA: spinal  ESTIMATED BLOOD LOSS: 200 mL  FLUIDS REPLACED: 800 mL of crystalloid  DRAINS: 2 medium drains to a Hemovac reservoir  IMPLANTS UTILIZED: DePuy size 7 Summit femoral stem (cemented), 13 mm Cementralizer, 47 mm OD Cathcart hip ball, +0 mm tapered spacer, and a size 5 femoral cement restrictor  INDICATIONS FOR SURGERY: Prudencio PairLiller W Mault is a 83 y.o. year old female who fell and sustained a displaced left femoral neck fracture. After discussion of the risks and benefits of surgical intervention, the patient expressed understanding of the risks benefits and agree with plans for hip hemiarthroplasty.   The risks, benefits, and alternatives were discussed at length including but not limited to the risks of infection, bleeding, nerve injury, stiffness, blood clots, the need for revision surgery, limb length inequality, dislocation, cardiopulmonary complications, among others, and they were willing to proceed.  PROCEDURE IN DETAIL: The patient was brought into the operating room and, after adequate spinal anesthesia was achieved, patient was placed in a right lateral decubitus position. Axillary roll was placed and all bony prominences were well-padded. The patient's left hip was cleaned and prepped with alcohol and DuraPrep and draped in the usual sterile fashion. A "timeout" was performed as per usual protocol. A lateral curvilinear incision was made gently curving towards the posterior superior iliac spine. The IT band was incised in line with the skin incision and the fibers of the gluteus maximus were split in line. The piriformis tendon was identified, skeletonized, and incised at its insertion to the  proximal femur and reflected posteriorly. A T type posterior capsulotomy was performed. The femoral head was then removed using a corkscrew device. The femoral head was measured using calipers and ring gauges and determined to be 47 mm in diameter.The femoral neck cut was performed using an oscillating saw. The acetabulum was inspected for any bony fragments. The articular surface was in good condition.  Attention was then directed to the proximal femur. A pilot hole for preparation of the proximal femoral canal was created using a high-speed bur. The femoral canal finder was inserted followed by insertion of the conical reamer. Serial broaches were inserted up to a size 7 broach. Calcar region was planed and a trial reduction was performed using a 47 mm OD Cathcart ball with a +0 mm neck length. Good equalization of limb lengths was appreciated and excellent stability was noted both anteriorly and posteriorly. Trial components were removed. The femoral canal was sized and was felt that a size 5 cement restrictor was appropriate. The cement restrictor was inserted to the appropriate depth in the femoral canal was irrigated with copious amounts of fluid using the pulse lavage and suctioned dry. The femoral canal was then packed with vaginal packing soaked in dilute Neo-Synephrine. Polymethylmethacrylate cement was prepared in the usual fashion using a vacuum mixer. Vaginal packing was removed and the canal again irrigated and suctioned dry. The polymethylmethacrylate cement was inserted in retrograde fashion and pressurized. The size 7 Summit femoral component with a 13 mm Cementralizer was positioned and impacted into place. Excess cement was removed using Personal assistantreer elevators. After adequate curing of the cement, the Morse taper was cleaned and dried. A 47 mm outer diameter Cathcart  hip ball with a +0 mm tapered spacer was placed on the trunnion and impacted into place. The acetabulum was again irrigated and suctioned  dry, making sure to inspect for any residal bony debris. The femoral head was then reduced and placed through a range of motion. Excellent stability was noted both anteriorly and posteriorly. Good equalization of limb lengths was appreciated.   The wound was irrigated with copious amounts of normal saline with antibiotic solution and suctioned dry. Good hemostasis was appreciated. The posterior capsulotomy was repaired using #5 Ethibond. Piriformis tendon was reapproximated to the undersurface of the gluteus medius tendon using #5 Ethibond. The IT band was reapproximated using interrupted sutures of #1 Vicryl. Subcutaneous tissue was proximal phalanx using first #0 Vicryl followed by #2-0 Vicryl. The skin was closed with skin staples. A Prevena Plus wound VAC was applied.  The patient tolerated the procedure well and was transported to the recovery room in stable condition.   Jena GaussJames P Shambria Camerer, Jr., M.D.

## 2017-05-11 NOTE — Progress Notes (Signed)
To O.R. via bed

## 2017-05-11 NOTE — Progress Notes (Signed)
[facility]

## 2017-05-11 NOTE — Progress Notes (Signed)
eLink Physician-Brief Progress Note Patient Name: Prudencio PairLiller W Favor DOB: 09/02/1926 MRN: [id number]   Date of Service  04/18/2017  HPI/Events of Note  83 year old female with known history of  coronary artery disease status post bypass as well as congestive heart failure.  Patient underwent hip hemiarthroplasty. Patient with hypoxia postoperatively requiring nasal cannula oxygen. Camera shows patient resting comfortably with family member at bedside. Normal work of breathing.   eICU Interventions  1. Plan for hemodialysis tomorrow 2. Continue to increase FiO2 to maintain saturation greater than 88% 3. Remainder of care as per primary service      Intervention Category Evaluation Type: New Patient Evaluation  Lawanda CousinsJennings Nestor 05/03/2017, 4:58 PM

## 2017-05-11 NOTE — Progress Notes (Signed)
SLP Cancellation Note  Patient Details Name: Cindy Weaver MRN: [id number] DOB: 12/30/1925   Cancelled treatment:       Reason Eval/Treat Not Completed: Patient at procedure or test/unavailable   Meredith PelStacie Harris Sauber 04/29/2017, 11:59 AM

## 2017-05-11 NOTE — Progress Notes (Signed)
   05/10/17 1448  PPD Results  Does patient have an induration at the injection site? No  Induration(mm) 0 mm  Name of Physician Notified Dr. Elisabeth PigeonVachhani

## 2017-05-11 NOTE — Progress Notes (Signed)
[facility]  ROUNDING NOTE   Subjective:   Left hip surgery today. Blood pressure was low. Started IV fluids and PRBC transfusion.  Objective:  Vital signs in last 24 hours:  Temp:  [97.1 F (36.2 C)-99.3 F (37.4 C)] 98.4 F (36.9 C) (07/27 1515) Pulse Rate:  [88-109] 96 (07/27 1629) Resp:  [14-27] 22 (07/27 1629) BP: (85-104)/(52-65) 96/52 (07/27 1629) SpO2:  [91 %-100 %] 95 % (07/27 1629) Weight:  [69.8 kg (153 lb 12.8 oz)] 69.8 kg (153 lb 12.8 oz) (07/27 0509)  Weight change: 3.1 kg (6 lb 13.4 oz) Filed Weights   05/10/17 0500 05/10/17 1050 04/29/2017 0509  Weight: 70.6 kg (155 lb 9.6 oz) 70.6 kg (155 lb 10.3 oz) 69.8 kg (153 lb 12.8 oz)    Intake/Output: I/O last 3 completed shifts: In: 3555.8 [P.O.:100; I.V.:2965.8; Blood:320; IV Piggyback:170] Out: 1400 [Urine:400; Other:1000]   Intake/Output this shift:  Total I/O In: 3895 [I.V.:3375; Blood:330; Other:90; IV Piggyback:100] Out: 320 [Urine:120; Blood:200]  Physical Exam: General: NAD  Head: Normocephalic, atraumatic. Moist oral mucosal membranes  Eyes: Anicteric, PERRL  Neck: Supple, trachea midline  Lungs:  + bilateral crackles  Heart: irregular  Abdomen:  Soft, nontender  Extremities: no peripheral edema. Left hip in traction  Neurologic: Nonfocal, moving all four extremities  Skin: No lesions  Access: Right femoral temp HD cathter Dr. Lucky Cowboy 0/62    Basic Metabolic Panel:  Recent Labs Lab 04/20/2017 0348 05/08/17 0552 05/09/17 0655 05/10/17 0306 04/17/2017 0210  NA 133* 128* 134* 137 140  K 5.2* 5.6* 4.7 3.9 4.0  CL 93* 90* 95* 100* 103  CO2 _0 GLUCOSE 137* 101* 94 99 102*  BUN 81* 90* 73* 46* 35*  CREATININE 3.50* 3.59* 3.07* 2.48* 1.87*  CALCIUM 8.5* 8.5* 8.3* 8.0* 8.1*  PHOS  --   --   --  2.2*  --     Liver Function Tests:  Recent Labs Lab 05/09/17 0655 05/10/17 0306  AST 23  --   ALT 15  --   ALKPHOS 73  --   BILITOT 0.7  --   PROT 5.5*  --   ALBUMIN 2.3*  2.1*   No results for input(s): LIPASE, AMYLASE in the last 168 hours. No results for input(s): AMMONIA in the last 168 hours.  CBC:  Recent Labs Lab 05/06/17 0526 05/06/17 1757 05/08/17 1058 05/10/17 0306 05/04/2017 0210  WBC 10.9 10.5 8.5 12.1* 14.6*  HGB 8.3* 9.3* 8.3* 7.9* 9.1*  HCT 24.2* 27.2* 24.3* 23.6* 27.1*  MCV 95.8 96.4 94.4 95.4 90.0  PLT 219 244 230 202 194    Cardiac Enzymes: No results for input(s): CKTOTAL, CKMB, CKMBINDEX, TROPONINI in the last 168 hours.  BNP: Invalid input(s): POCBNP  CBG: No results for input(s): GLUCAP in the last 168 hours.  Microbiology: Results for orders placed or performed during the hospital encounter of 05/01/2017  MRSA PCR Screening     Status: None   Collection Time: 05/04/17  8:27 AM  Result Value Ref Range Status   MRSA by PCR NEGATIVE NEGATIVE Final    Comment:        The GeneXpert MRSA Assay (FDA approved for NASAL specimens only), is one component of a comprehensive MRSA colonization surveillance program. It is not intended to diagnose MRSA infection nor to guide or monitor treatment for MRSA infections.   Culture, expectorated sputum-assessment     Status: None   Collection Time: 05/05/17 10:55 AM  Result  Value Ref Range Status   Specimen Description SPUTUM  Final   Special Requests Normal  Final   Sputum evaluation THIS SPECIMEN IS ACCEPTABLE FOR SPUTUM CULTURE  Final   Report Status 05/05/2017 FINAL  Final  Culture, respiratory (NON-Expectorated)     Status: None   Collection Time: 05/05/17 10:55 AM  Result Value Ref Range Status   Specimen Description SPUTUM  Final   Special Requests Normal Reflexed from [id number]  Final   Gram Stain   Final    FEW WBC PRESENT, PREDOMINANTLY PMN FEW GRAM NEGATIVE RODS FEW GRAM POSITIVE COCCI IN PAIRS FEW GRAM POSITIVE RODS RARE BUDDING YEAST SEEN    Culture   Final    Consistent with normal respiratory flora. Performed at [facility], [place] [address]  [place], [place] [address]    Report Status 04/17/2017 FINAL  Final    Coagulation Studies:  Recent Labs  05/09/17 0655 05/10/17 0306 04/25/2017 0210  LABPROT 19.5* 16.6* 17.1*  INR 1.63 1.33 1.38    Urinalysis: No results for input(s): COLORURINE, LABSPEC, PHURINE, GLUCOSEU, HGBUR, BILIRUBINUR, KETONESUR, PROTEINUR, UROBILINOGEN, NITRITE, LEUKOCYTESUR in the last 72 hours.  Invalid input(s): APPERANCEUR    Imaging: Dg Hip Port Unilat With Pelvis 1v Left  Result Date: 05/03/2017 CLINICAL DATA:  Postop left total hip arthroplasty. EXAM: DG HIP (WITH OR WITHOUT PELVIS) 1V PORT LEFT COMPARISON:  05/01/2017 radiographs. FINDINGS: 1117 hours. AP pelvis and cross-table lateral view of the left hip demonstrate interval left hip bipolar hemiarthroplasty. The hardware is well positioned. No evidence of acute fracture or dislocation. There is air within the joint and soft tissues surrounding the hip. Possible right femoral line. IMPRESSION: No demonstrated complication following left hip hemiarthroplasty. Electronically Signed   By: Richardean Sale M.D.   On: 05/04/2017 11:37     Medications:   . sodium chloride 60 mL/hr at 04/16/2017 1210  . [MAR Hold] sodium chloride    . sodium chloride    . acetaminophen Stopped (05/01/2017 1249)  . [MAR Hold] cefTRIAXone (ROCEPHIN) 1 g IVPB Stopped (05/10/17 1009)   . [MAR Hold] albuterol  2.5 mg Nebulization Q4H  . [MAR Hold] amiodarone  200 mg Oral BID  . azithromycin  250 mg Oral Daily  . [MAR Hold] budesonide (PULMICORT) nebulizer solution  0.5 mg Nebulization BID  . [MAR Hold] docusate sodium  100 mg Oral BID  . [MAR Hold] feeding supplement (ENSURE ENLIVE)  237 mL Oral TID BM  . [MAR Hold] ferrous sulfate  325 mg Oral Q breakfast  . [MAR Hold] fluticasone  1 spray Each Nare Daily  . [MAR Hold] guaiFENesin  1,200 mg Oral BID  . [MAR Hold] mometasone-formoterol  2 puff Inhalation BID  . [MAR Hold] multivitamin with minerals  1 tablet Oral Daily  .  [MAR Hold] pantoprazole  40 mg Oral Daily  . [MAR Hold] polyethylene glycol  17 g Oral Daily  . [MAR Hold] predniSONE  20 mg Oral Q breakfast  . [MAR Hold] senna  1 tablet Oral BID  . sodium chloride      . [MAR Hold] tiotropium  18 mcg Inhalation Daily   [MAR Hold] acetaminophen **OR** [MAR Hold] acetaminophen, [MAR Hold] bisacodyl, fentaNYL (SUBLIMAZE) injection, [MAR Hold] HYDROcodone-acetaminophen, [MAR Hold] metoCLOPramide (REGLAN) injection, [MAR Hold]  morphine injection, [MAR Hold] ondansetron **OR** [MAR Hold] ondansetron (ZOFRAN) IV, ondansetron (ZOFRAN) IV, [MAR Hold] pneumococcal 23 valent vaccine, [MAR Hold] senna  Assessment/ Plan:  Ms. Cindy Weaver is a 83  y.o. black female with Chronic kidney disease stage III baseline EGFR 52, congestive heart failure, hyperlipidemia, disease status post CABG, anemia of chronic kidney disease, chronic atrial fibrillation, severe mitral regurgitation, hypertension, GERD, COPD, who was admitted to [facility] on 04/27/2017 for evaluation of Fall.  Patient developed left hip fracture and also had Atrial fibrillation with RVR and acute renal failure.  1. Acute renal failure on chronic kidney disease stage III: baseline creatinine 1.18, GFR of 52 from 04/01/17 Oliguric urine output.  - Discontinue IV fluids. Give IV furosemide 26m x 1.  - Hemodialysis treatment on 7/24 and 7/26 - Plan on dialysis tomorrow  2. Anemia of chronic kidney disease. Hemoglobin 9.1. Status post multiple transfusions this admission  3. Hypotension with atrial fibrillation with rapid ventricular response:  - on amiodarone - Appreciate cardiology input. Monitor for exacerbation of congestive heart failure.     LOS: 9 Tanith Dagostino 7/27/20184:32 PM

## 2017-05-11 NOTE — Anesthesia Procedure Notes (Signed)
Spinal  Patient location during procedure: OR Staffing Anesthesiologist: Alvin Critchley Resident/CRNA: Rolla Plate Performed: resident/CRNA  Preanesthetic Checklist Completed: patient identified, site marked, surgical consent, pre-op evaluation, timeout performed, IV checked, risks and benefits discussed and monitors and equipment checked Spinal Block Patient position: right lateral decubitus Prep: ChloraPrep and site prepped and draped Patient monitoring: heart rate, continuous pulse ox, blood pressure and cardiac monitor Approach: midline Location: L4-5 Injection technique: single-shot Needle Needle type: Introducer and Pencan  Needle gauge: 24 G Needle length: 9 cm Additional Notes Negative paresthesia. Negative blood return. Positive free-flowing CSF. Expiration date of kit checked and confirmed. Patient tolerated procedure well, without complications.

## 2017-05-11 NOTE — Progress Notes (Addendum)
[employer] [facility] [place] [facility]   PATIENT NAME: Cindy FoleyLiller Weaver    MR#:  [id number]  DATE OF BIRTH:  01/11/1926  SUBJECTIVE:  CHIEF COMPLAINT:   Chief Complaint  Patient presents with  . Fall  . Cough   -  No fevers. Pain better controlled. Requiring less amounts of oxygen today. -Heart rate is controlled, IV amiodarone and switched to oral. INR came down to 1.6. Renal function continued to get worse so finally started on hemodialysis. She have c/o cough, better than yesterday now. Seen in postop recovery area, tolerated surgery well but had hypotension after that so received 500 mL of fluid bolus and required blood transfusion to stabilize the blood pressure. Pt had no complains.  REVIEW OF SYSTEMS:  Review of Systems  Constitutional: Positive for malaise/fatigue. Negative for chills and fever.  HENT: Negative for ear discharge, hearing loss, nosebleeds and sore throat.   Eyes: Negative for blurred vision and double vision.  Respiratory: Positive for cough. Negative for shortness of breath and wheezing.   Cardiovascular: Negative for chest pain, palpitations and leg swelling.  Gastrointestinal: Positive for constipation. Negative for abdominal pain, diarrhea, nausea and vomiting.  Genitourinary: Negative for dysuria.  Musculoskeletal: Positive for joint pain and myalgias.  Neurological: Negative for dizziness and seizures.    DRUG ALLERGIES:   Allergies  Allergen Reactions  . Penicillins Itching and Rash    Other reaction(s): RASH  . Enalapril Other (See Comments)    cough  . Statins Other (See Comments)    Other reaction(s): MUSCLE PAIN    VITALS:  Blood pressure 105/61, pulse (!) 102, temperature 97.8 F (36.6 C), temperature source Oral, resp. rate (!) 22, height 5' 8.5" (1.74 m), weight 69.8 kg (153 lb 12.8 oz), SpO2 90 %.  PHYSICAL EXAMINATION:  Physical Exam  GENERAL:  83 y.o.-year-old patient lying in the bed with no acute distress.  Appears uncomfortable in bed. Denies any pain EYES: Pupils equal, round, reactive to light and accommodation. No scleral icterus. Extraocular muscles intact.  HEENT: Head atraumatic, normocephalic. Oropharynx and nasopharynx clear.  NECK:  Supple, no jugular venous distention. No thyroid enlargement, no tenderness.  LUNGS: Normal breath sounds bilaterally, no wheezing, some crepitation. No use of accessory muscles of respiration. Rhonchi at the Lung bases, left greater than right. CARDIOVASCULAR: S1, S2 normal. No  rubs, or gallops. Loud 4/6 systolic murmur heard ABDOMEN: Soft, nontender, nondistended. Bowel sounds present. No organomegaly or mass.  EXTREMITIES: No pedal edema, cyanosis, or clubbing. Left leg in traction NEUROLOGIC: Cranial nerves II through XII are intact. Muscle strength 4/5 in all extremities except left leg due to pain from fracture and being on traction. Sensation intact. Gait not checked.  PSYCHIATRIC: The patient is alert and oriented x 3.  SKIN: No obvious rash, lesion, or ulcer.    LABORATORY PANEL:   CBC  Recent Labs Lab 05/15/2017 0210  WBC 14.6*  HGB 9.1*  HCT 27.1*  PLT 194   ------------------------------------------------------------------------------------------------------------------  Chemistries   Recent Labs Lab 05/09/17 0655  05/04/2017 0210  NA 134*  < > 140  K 4.7  < > 4.0  CL 95*  < > 103  CO2 26  < > 29  GLUCOSE 94  < > 102*  BUN 73*  < > 35*  CREATININE 3.07*  < > 1.87*  CALCIUM 8.3*  < > 8.1*  AST 23  --   --   ALT 15  --   --  ALKPHOS 73  --   --   BILITOT 0.7  --   --   < > = values in this interval not displayed. ------------------------------------------------------------------------------------------------------------------  Cardiac Enzymes No results for input(s): TROPONINI in the last 168  hours. ------------------------------------------------------------------------------------------------------------------  RADIOLOGY:  Dg Hip Port Unilat With Pelvis 1v Left  Result Date: 04/28/2017 CLINICAL DATA:  Postop left total hip arthroplasty. EXAM: DG HIP (WITH OR WITHOUT PELVIS) 1V PORT LEFT COMPARISON:  05/15/2017 radiographs. FINDINGS: 1117 hours. AP pelvis and cross-table lateral view of the left hip demonstrate interval left hip bipolar hemiarthroplasty. The hardware is well positioned. No evidence of acute fracture or dislocation. There is air within the joint and soft tissues surrounding the hip. Possible right femoral line. IMPRESSION: No demonstrated complication following left hip hemiarthroplasty. Electronically Signed   By: Carey BullocksWilliam  Veazey M.D.   On: 05/09/2017 11:37    EKG:   Orders placed or performed during the hospital encounter of 21-Dec-2016  . ED EKG  . ED EKG    ASSESSMENT AND PLAN:   83 year old female with multiple medical problems including A. fib on Coumadin, CAD status post CABG, mitral valve clipping for severe MR, bioprosthetic aortic valve replacement surgery, chronic respiratory failure on 2 L home oxygen, Systolic CHF with EF of 45% presents to the hospital secondary to fall and noted to have left hip fracture.  #1 atrial fibrillation with rapid ventricular response-likely triggered by her acute systolic failure and hypoxia - Initially given amiodarone drip- switched to oral amio. started on oral metoprolol. -Echocardiogram with EF of 35-40%. -Appreciate cardiology consult. not a candidate for digoxin due to renal failure. -Due to her surgery, Coumadin on hold.  - amiodarone. HR is under control. INR is < 2 now, with oral vitamin K.   start on heparin drip until she finishes the surgery and able to restart the Coumadin.  #2 acute on chronic systolic CHF- received IV Lasix on admission. -Due to worsening renal failure, will hold Lasix . - Appreciate  cardiology consult - now on gentle hydration for renal failure. - have pleural effusion, Appreciated nephro help with HD.  - post surgical fluid bolus and PRBC- so suggest to give IV lasix one time  #3 acute on chronic respiratory failure with hypoxia-secondary to systolic CHF-acute and chronic. EF- 35% -also significant pneumonitis noted on chest x-ray. MRSA PCR is negative. Was on cefepime. Switch to rocephin now. We may stop after total 7 days of treatment. -Continue to wean oxygen as tolerated. At home she is only on 2 L. - requested pulmonary consult as well- since she will need surgery. -Continue nebulizers.   Encouraged to use incentive spirometry.  #4 acute renal failure-Baseline creatinine seems to be between 1.2-1.3 - Gradually getting worse. avoid nephrotoxins - ATn from hypotension and also diuresis, gentle hydration today - gradual worsening- called nephro consult. - Cont hydration. Does not have much urine output.  - nephro started on HD 05/08/17. - advised to cont HD as she have some edema and pleural effusion. - IV lasix as received Bolus fluid and PRBC after surgery.  #5 left hip fracture-secondary to mechanical fall. Ortho has been consulted. -Patient ready for surgery from cardiac point optimized. HR is stable now.  -Coumadin  On hold,  Continue pain medications for pain control. - heparin IV, now after sx- ortho suggest to start from tomorrow, on heparin and coumadin.  #6 DVT prophylaxis - was on coumadin, now on heparin drip.  #7 constipation  Added miralax and senna. Poor oral intake.  #8 anemia of blood loss   Hb < 8 , and will go for surgery tomorrow, so transfused one unit PRBC during HD today.   discussed with pt about possible side effects of blood transfusion- including more common like low grade fever to rare and serious like renal and lung involvement and infections.  She understood- and due to necessity of transfusion- agreed to receive the  transfusion.  Transfused one more unit after sx.  Her BP dropped to 80 and 70 systolic and received bolus and PRBC post op, so will monitor in stepdown unit tonight.  All the records are reviewed and case discussed with Care Management/Social Worker. Management plans discussed with the patient, family and they are in agreement.  CODE STATUS: Full code  TOTAL TIME TAKING CARE OF THIS PATIENT: 35 minutes.   POSSIBLE D/C IN 3-4 DAYS, DEPENDING ON CLINICAL CONDITION.   Altamese Dilling M.D on 05/15/2017 at 6:29 PM  Between 7am to 6pm - Pager - [phone]  After 6pm go to [email or web address] - Social research officer, government  [facility]  Office  [phone]  CC: Primary care physician; [facility]

## 2017-05-11 NOTE — Pre-Procedure Instructions (Signed)
Dr Noralyn Pickarroll at bedside, paged Dr Ernest PineHooten.

## 2017-05-12 ENCOUNTER — Encounter: Payer: Self-pay | Admitting: Orthopedic Surgery

## 2017-05-12 ENCOUNTER — Inpatient Hospital Stay: Payer: Medicare Other

## 2017-05-12 DIAGNOSIS — S72001A Fracture of unspecified part of neck of right femur, initial encounter for closed fracture: Secondary | ICD-10-CM

## 2017-05-12 DIAGNOSIS — J90 Pleural effusion, not elsewhere classified: Secondary | ICD-10-CM

## 2017-05-12 LAB — CBC
HCT: 30.3 % — ABNORMAL LOW (ref 35.0–47.0)
Hemoglobin: 10.1 g/dL — ABNORMAL LOW (ref 12.0–16.0)
MCH: 30 pg (ref 26.0–34.0)
MCHC: 33.5 g/dL (ref 32.0–36.0)
MCV: 89.5 fL (ref 80.0–100.0)
PLATELETS: 167 10*3/uL (ref 150–440)
RBC: 3.38 MIL/uL — AB (ref 3.80–5.20)
RDW: 18.2 % — AB (ref 11.5–14.5)
WBC: 16 10*3/uL — AB (ref 3.6–11.0)

## 2017-05-12 LAB — TYPE AND SCREEN
ABO/RH(D): O POS
ANTIBODY SCREEN: NEGATIVE
UNIT DIVISION: 0
UNIT DIVISION: 0

## 2017-05-12 LAB — MAGNESIUM: MAGNESIUM: 2 mg/dL (ref 1.7–2.4)

## 2017-05-12 LAB — BPAM RBC
BLOOD PRODUCT EXPIRATION DATE: 201808222359
Blood Product Expiration Date: 201808222359
ISSUE DATE / TIME: 201807271334
ISSUE DATE / TIME: 201807261149
UNIT TYPE AND RH: 5100
Unit Type and Rh: 5100

## 2017-05-12 LAB — BASIC METABOLIC PANEL
ANION GAP: 10 (ref 5–15)
BUN: 42 mg/dL — AB (ref 6–20)
CALCIUM: 8.2 mg/dL — AB (ref 8.9–10.3)
CO2: 26 mmol/L (ref 22–32)
Chloride: 103 mmol/L (ref 101–111)
Creatinine, Ser: 2.59 mg/dL — ABNORMAL HIGH (ref 0.44–1.00)
GFR calc Af Amer: 18 mL/min — ABNORMAL LOW (ref >60)
GFR, EST NON AFRICAN AMERICAN: 15 mL/min — AB (ref >60)
GLUCOSE: 77 mg/dL (ref 65–99)
POTASSIUM: 4.5 mmol/L (ref 3.5–5.1)
SODIUM: 139 mmol/L (ref 135–145)

## 2017-05-12 LAB — HEPARIN LEVEL (UNFRACTIONATED): HEPARIN UNFRACTIONATED: 0.1 [IU]/mL — AB (ref 0.30–0.70)

## 2017-05-12 LAB — PHOSPHORUS: PHOSPHORUS: 3.5 mg/dL (ref 2.5–4.6)

## 2017-05-12 LAB — GLUCOSE, CAPILLARY: Glucose-Capillary: 117 mg/dL — ABNORMAL HIGH (ref 65–99)

## 2017-05-12 LAB — PROTIME-INR
INR: 1.55
PROTHROMBIN TIME: 18.7 s — AB (ref 11.4–15.2)

## 2017-05-12 MED ORDER — CHLORHEXIDINE GLUCONATE 0.12 % MT SOLN
15.0000 mL | Freq: Two times a day (BID) | OROMUCOSAL | Status: DC
  Administered 2017-05-12 – 2017-05-17 (×9): 15 mL via OROMUCOSAL
  Filled 2017-05-12 (×8): qty 15

## 2017-05-12 MED ORDER — HEPARIN (PORCINE) IN NACL 100-0.45 UNIT/ML-% IJ SOLN
1400.0000 [IU]/h | INTRAMUSCULAR | Status: DC
  Administered 2017-05-12: 1200 [IU]/h via INTRAVENOUS
  Administered 2017-05-13: 1400 [IU]/h via INTRAVENOUS
  Filled 2017-05-12 (×2): qty 250

## 2017-05-12 MED ORDER — WARFARIN SODIUM 5 MG PO TABS
5.0000 mg | ORAL_TABLET | Freq: Once | ORAL | Status: AC
  Administered 2017-05-12: 5 mg via ORAL
  Filled 2017-05-12: qty 1

## 2017-05-12 MED ORDER — HEPARIN BOLUS VIA INFUSION
2000.0000 [IU] | Freq: Once | INTRAVENOUS | Status: AC
  Administered 2017-05-12: 2000 [IU] via INTRAVENOUS
  Filled 2017-05-12: qty 2000

## 2017-05-12 MED ORDER — ORAL CARE MOUTH RINSE
15.0000 mL | Freq: Two times a day (BID) | OROMUCOSAL | Status: DC
  Administered 2017-05-12 – 2017-05-18 (×10): 15 mL via OROMUCOSAL

## 2017-05-12 NOTE — Evaluation (Signed)
Physical Therapy Evaluation Patient Details Name: Cindy Weaver MRN: [id number] DOB: 10/10/1926 Today's Date: 05/12/2017   History of Present Illness  Pt is an 83 yo female, POD1 s/p L hip hemiarthroplasty with complicated hospital stay. Pt transported to ED on 7/18 after fall at home (sustained transverse fx L femoral neck), admitted to telemetry to help make pt more stable for surgery (d/t a-fib with RVR and acute on chronic CHF).  Right temporary femoral cath inserted on 7/23 and hemodialysis initiated on 7/24 d/t acute renal failure.  S/p 1 unit PRBC's transfusion 7/26.  L hip hemiarthroplasty performed 7/27 with pt transfered to ICU following surgery due to hypoxia (imaging showing enlarging R pleural effusion and unchanged small L pleural effusion).   PMHx significant for CAD s/p CABG, A-fib, chronic renal failure, on 2L O2 at home, bioprosthetic aortic valve replacement surgery, systolic CHF with EF 45%.  Clinical Impression  Prior to hospital admission, pt was ambulating short distances in home (holding onto furniture as needed).  Pt has a friend that lives with her in 2 level home with stair lift to 2nd floor bedrooms; also has supportive family.  Currently pt has R temporary fem dialysis cath in place and per hospital protocol no R hip ROM and pt is on strict bedrest (mobility deferred during eval d/t this).  Currently will see pt QD but will increase daily frequency as appropriate once R temporary fem dialysis cath is removed and OOB mobility is able to be assessed/performed.  Pt demonstrating weakness with allowed LE ex's and has had an extended hospital stay d/t medical concerns.  Pt will benefit from skilled PT to address noted impairments and anticipated functional limitations (see below for any additional details).  Upon hospital discharge, recommend pt discharge to STR.    Follow Up Recommendations SNF    Equipment Recommendations  Rolling walker with 5" wheels    Recommendations  for Other Services       Precautions / Restrictions Precautions Precautions: Fall;Posterior Hip Precaution Comments: R temporary fem cath restrictions; L hip wound vac Restrictions Weight Bearing Restrictions: Yes LLE Weight Bearing: Weight bearing as tolerated      Mobility  Bed Mobility Overal bed mobility: Needs Assistance             General bed mobility comments: deferred due to R temporary femoral catheter restrictions  Transfers                 General transfer comment: deferred due to R temporary femoral catheter restrictions  Ambulation/Gait             General Gait Details: deferred due to R temporary femoral catheter restrictions  Stairs            Wheelchair Mobility    Modified Rankin (Stroke Patients Only)       Balance Overall balance assessment:  (deferred due to R temporary femoral catheter restrictions)                                           Pertinent Vitals/Pain Pain Assessment: 0-10 Pain Score: 0-No pain (3/10 with movement but 0/10 at rest) Pain Location: L hip Pain Descriptors / Indicators: Sore;Tender;Operative site guarding Pain Intervention(s): Limited activity within patient's tolerance;Monitored during session;Repositioned  Pt's BP 87/55 beginning of session but increased to 104/59 end of session.  HR 94-109 bpm and O2 >93%  on 4 L O2 via nasal cannula during session.    Home Living Family/patient expects to be discharged to:: Private residence Living Arrangements: Non-relatives/Friends Available Help at Discharge: Family;Available 24 hours/day Type of Home: House Home Access: Stairs to enter Entrance Stairs-Rails:  (see above) Entrance Stairs-Number of Steps: 2 steps from garage w/ R handrail (preferred), 2 steps from front of house with bilat handrails (unable to reach both rails at the same time) Home Layout: Two level Home Equipment: Shower seat - built in;Walker - 2 wheels;Cane - single  point;Bedside commode;Grab bars - toilet;Grab bars - tub/shower;Hand held shower head;Wheelchair - manual Additional Comments: has suction grab bars that can be installed in shower (not currently in place)    Prior Function Level of Independence: Needs assistance   Gait / Transfers Assistance Needed: Pt endorses "furniture walking", no formal AD for mobility (holds onto somebody when ambulating in community)  ADL's / Homemaking Assistance Needed: Pt able to perform bathing, dressing, toileting independently; friend staying with pt assisted with med mgt, cooking, cleaning, groceries, and laundry  Comments: Pt endorses 2 falls in past 12 months both due to a loss of balance     Hand Dominance   Dominant Hand: Right    Extremity/Trunk Assessment   Upper Extremity Assessment Upper Extremity Assessment: Defer to OT evaluation    Lower Extremity Assessment Lower Extremity Assessment: LLE deficits/detail (deferred R LE testing d/t R temporary fem cath but demonstrates fair R quad set and ankle DF/PF; R ankle DF ROM to neutral) LLE Deficits / Details: hip flexion at least 2+/5; knee flexion/extension at least 3-/5; DF at least 3/5 AROM; DF ROM to neutral       Communication   Communication: No difficulties  Cognition Arousal/Alertness: Awake/alert (pt initially sleeping but woke for eval) Behavior During Therapy: WFL for tasks assessed/performed Overall Cognitive Status: Within Functional Limits for tasks assessed                                        General Comments General comments (skin integrity, edema, etc.): Pt sleeping in bed with family present upon PT arrival.  Nursing cleared pt for participation in physical therapy.  Pt agreeable to PT session.    Exercises Total Joint Exercises Ankle Circles/Pumps: AROM;Strengthening;Both;10 reps;Supine Quad Sets: AROM;Strengthening;Both;10 reps;Supine Gluteal Sets: AROM;Strengthening;Both;10 reps;Supine Short Arc  Quad: AAROM;Strengthening;Left;10 reps;Supine Heel Slides: AAROM;Strengthening;Left;10 reps;Supine Hip ABduction/ADduction: AAROM;Strengthening;Left;10 reps;Supine Straight Leg Raises: AAROM;Strengthening;Left;10 reps;Supine   Assessment/Plan    PT Assessment Patient needs continued PT services  PT Problem List Decreased strength;Decreased range of motion;Decreased activity tolerance;Decreased balance;Decreased mobility;Decreased knowledge of use of DME;Decreased knowledge of precautions;Pain       PT Treatment Interventions DME instruction;Gait training;Stair training;Functional mobility training;Therapeutic activities;Therapeutic exercise;Balance training;Patient/family education    PT Goals (Current goals can be found in the Care Plan section)  Acute Rehab PT Goals Patient Stated Goal: get better and go home PT Goal Formulation: With patient Time For Goal Achievement: 05/26/17 Potential to Achieve Goals: Fair    Frequency 7X/week   Barriers to discharge Decreased caregiver support      Co-evaluation               AM-PAC PT "6 Clicks" Daily Activity  Outcome Measure Difficulty turning over in bed (including adjusting bedclothes, sheets and blankets)?: Total Difficulty moving from lying on back to sitting on the side of the bed? : Total  Difficulty sitting down on and standing up from a chair with arms (e.g., wheelchair, bedside commode, etc,.)?: Total Help needed moving to and from a bed to chair (including a wheelchair)?: A Lot Help needed walking in hospital room?: Total Help needed climbing 3-5 steps with a railing? : Total 6 Click Score: 7    End of Session Equipment Utilized During Treatment: Oxygen (4 L O2 via nasal cannula) Activity Tolerance: Patient limited by fatigue Patient left: in bed;with call bell/phone within reach;with bed alarm set;with family/visitor present (B heels elevated via pillows) Nurse Communication: Mobility status;Precautions;Weight  bearing status PT Visit Diagnosis: Muscle weakness (generalized) (M62.81);Other abnormalities of gait and mobility (R26.89);History of falling (Z91.81);Pain Pain - Right/Left: Left Pain - part of body: Hip    Time: 1510-1535 PT Time Calculation (min) (ACUTE ONLY): 25 min   Charges:   PT Evaluation $PT Eval Low Complexity: 1 Procedure PT Treatments $Therapeutic Exercise: 8-22 mins   PT G Codes:   PT G-Codes **NOT FOR INPATIENT CLASS** Functional Assessment Tool Used: AM-PAC 6 Clicks Basic Mobility Functional Limitation: Mobility: Walking and moving around Mobility: Walking and Moving Around Current Status (A2130(G8978): At least 80 percent but less than 100 percent impaired, limited or restricted Mobility: Walking and Moving Around Goal Status [phone](G8979): At least 20 percent but less than 40 percent impaired, limited or restricted    Hendricks Limesmily Evanna Washinton, PT 05/12/17, 5:28 PM [phone]

## 2017-05-12 NOTE — Plan of Care (Signed)
Problem: Pain Managment: Goal: General experience of comfort will improve Outcome: Progressing Pt remained pain free overnight

## 2017-05-12 NOTE — Progress Notes (Signed)
Speech Language Pathology Dysphagia Treatment Patient Details Name: Cindy Weaver W Rasch MRN: [id number] DOB: 09/09/1926 Today's Date: 05/12/2017 Time: [id number] SLP Time Calculation (min) (ACUTE ONLY): 23 min  Assessment / Plan / Recommendation Clinical Impression   pt presents with a moderate to severe oral pharyngeal dysphagia characterized by immediate coughing and delayed coughing with thin liquids via cup and while utilizing straw. slp discussed diet with nursing as pt had been placed on full thin liquid diet post surgery. SLP educated nsg on previous order for honey thick liquids. NSG reports pt has been coughing with liquids and meds were being crushed in puree. Pt was noted to cough with each sip thin liquid via cup and straw for thin liquids. Pt had no coughing or O2 changes with honey thick liquids. St educated pt and nsg to continue honey thick liquid diet at this time and solids could be advanced to dys 2 as MD sees appropriate.     Diet Recommendation    Full liquid honey thick , advance to dys 2 solids as MD sees appropriate.    SLP Plan Continue with current plan of care   Pertinent Vitals/Pain None reported   Swallowing Goals     General Behavior/Cognition: Alert;Cooperative;Pleasant mood Patient Positioning: Partially reclined Oral care provided: N/A  Oral Cavity - Oral Hygiene Does patient have any of the following "at risk" factors?: Oxygen therapy - cannula, mask, simple oxygen devices Patient is AT RISK: Order set for Adult Oral Care Standing Orders initiated -  "At Risk Patients" option selected (see row information)   Dysphagia Treatment Treatment Methods: Skilled observation;Upgraded PO texture trial;Patient/caregiver education Patient observed directly with PO's: Yes Type of PO's observed: Thin liquids;Dysphagia 1 (puree);Honey-thick liquids Feeding: Able to feed self Liquids provided via: Teaspoon;Cup;Straw Pharyngeal Phase Signs & Symptoms: Audible swallow;Wet  vocal quality;Immediate cough;Delayed cough Type of cueing: Verbal Amount of cueing: Minimal   GO     Meredith PelStacie Harris Sauber 05/12/2017, 11:48 AM

## 2017-05-12 NOTE — Progress Notes (Signed)
[employer] - [place] at [facility]   PATIENT NAME: Cindy Weaver    MR#:  [id number]  DATE OF BIRTH:  12/09/1925  SUBJECTIVE:   Status post left hip hemiarthroplasty, postop day #1 today. Having some pain in the left hip. No other acute events overnight. Making some urine and the plan for hemodialysis today. Remains in atrial fibrillation but rate controlled.  REVIEW OF SYSTEMS:    Review of Systems  Constitutional: Negative for chills and fever.  HENT: Negative for congestion and tinnitus.   Eyes: Negative for blurred vision and double vision.  Respiratory: Negative for cough, shortness of breath and wheezing.   Cardiovascular: Negative for chest pain, orthopnea and PND.  Gastrointestinal: Negative for abdominal pain, diarrhea, nausea and vomiting.  Genitourinary: Negative for dysuria and hematuria.  Musculoskeletal: Positive for joint pain (Left Hip).  Neurological: Negative for dizziness, sensory change and focal weakness.  All other systems reviewed and are negative.   Nutrition: Full liquid Tolerating Diet: Yes Tolerating PT: Await Eval.   DRUG ALLERGIES:   Allergies  Allergen Reactions  . Penicillins Itching and Rash    Other reaction(s): RASH  . Enalapril Other (See Comments)    cough  . Statins Other (See Comments)    Other reaction(s): MUSCLE PAIN    VITALS:  Blood pressure (!) 97/56, pulse (!) 102, temperature 97.8 F (36.6 C), temperature source Axillary, resp. rate 15, height 5' 8.5" (1.74 m), weight 69.8 kg (153 lb 12.8 oz), SpO2 95 %.  PHYSICAL EXAMINATION:   Physical Exam  GENERAL:  83 y.o.-year-old patient lying in bed in no acute distress.  EYES: Pupils equal, round, reactive to light and accommodation. No scleral icterus. Extraocular muscles intact.  HEENT: Head atraumatic, normocephalic. Oropharynx and nasopharynx clear.  NECK:  Supple, no jugular venous distention. No thyroid enlargement, no tenderness.  LUNGS: Normal breath  sounds bilaterally, no wheezing, rales, rhonchi. No use of accessory muscles of respiration.  CARDIOVASCULAR: S1, S2 Irregular. No murmurs, rubs, or gallops.  ABDOMEN: Soft, nontender, nondistended. Bowel sounds present. No organomegaly or mass.  EXTREMITIES: No cyanosis, clubbing or edema b/l. Left Hip dressing w/ wound vac in place.     NEUROLOGIC: Cranial nerves II through XII are intact. No focal Motor or sensory deficits b/l.  Globally weak.  PSYCHIATRIC: The patient is alert and oriented x 3.  SKIN: No obvious rash, lesion, or ulcer.    LABORATORY PANEL:   CBC  Recent Labs Lab 05/12/17 0441  WBC 16.0*  HGB 10.1*  HCT 30.3*  PLT 167   ------------------------------------------------------------------------------------------------------------------  Chemistries   Recent Labs Lab 05/09/17 0655  05/12/17 0441  NA 134*  < > 139  K 4.7  < > 4.5  CL 95*  < > 103  CO2 26  < > 26  GLUCOSE 94  < > 77  BUN 73*  < > 42*  CREATININE 3.07*  < > 2.59*  CALCIUM 8.3*  < > 8.2*  MG  --   --  2.0  AST 23  --   --   ALT 15  --   --   ALKPHOS 73  --   --   BILITOT 0.7  --   --   < > = values in this interval not displayed. ------------------------------------------------------------------------------------------------------------------  Cardiac Enzymes No results for input(s): TROPONINI in the last 168 hours. ------------------------------------------------------------------------------------------------------------------  RADIOLOGY:  Dg Chest Port 1 View  Result Date: 05/12/2017 CLINICAL DATA:  Acute respiratory failure. EXAM: PORTABLE  CHEST 1 VIEW COMPARISON:  05/09/2017 FINDINGS: Sequelae of prior CABG are again identified. The cardiac silhouette remains enlarged. Aortic atherosclerosis is noted. There is a moderate-sized right pleural effusion which appears to have increased in size with increasing right lower lobe opacity. A small left pleural effusion is again seen.  Diffusely increased interstitial markings do not appear significantly changed. No pneumothorax is identified. IMPRESSION: 1. Enlarging right pleural effusion with worsening right basilar aeration. 2. Unchanged small left pleural effusion. 3. Unchanged interstitial densities which may reflect mild edema superimposed on chronic interstitial lung disease. Electronically Signed   By: Sebastian AcheAllen  Grady M.D.   On: 05/12/2017 07:33   Dg Hip Port Unilat With Pelvis 1v Left  Result Date: 04/24/2017 CLINICAL DATA:  Postop left total hip arthroplasty. EXAM: DG HIP (WITH OR WITHOUT PELVIS) 1V PORT LEFT COMPARISON:  2017-08-09 radiographs. FINDINGS: 1117 hours. AP pelvis and cross-table lateral view of the left hip demonstrate interval left hip bipolar hemiarthroplasty. The hardware is well positioned. No evidence of acute fracture or dislocation. There is air within the joint and soft tissues surrounding the hip. Possible right femoral line. IMPRESSION: No demonstrated complication following left hip hemiarthroplasty. Electronically Signed   By: Carey BullocksWilliam  Veazey M.D.   On: 05/09/2017 11:37     ASSESSMENT AND PLAN:   83 year old female with past medical history of chronic afibrillation, history of coronary artery disease status post bypass, history of mitral levels placement, chronic respiratory failure, acute systolic CHF who presented to the hospital after a fall and noted to have left hip fracture.  1. Status post fall and left hip fracture-seen by orthopedics and is status post left hip hemiarthroplasty postoperative day #1 today. -Continue pain control as per orthopedics. Await physical therapy evaluation.  2. Atrial fibrillation with rapid ventricular response-rates are much improved now but she remains in atrial fibrillation. Initially was on amiodarone drip now weaned off of it. Continue oral amiodarone and metoprolol. -Appreciate cardiology input. Continue heparin drip and will resume Coumadin today.  3.  Acute kidney injury-baseline creatinine around 1.2-1.3. Suspect this is ATN from hypotension -Did not respond to fluids, therefore started on hemodialysis. Making some urine today, no acute indication for hemodialysis today. Nephrology will continue to follow and assess need for hemodialysis on daily basis.  4. COPD-no acute exacerbation-continue albuterol nebulizers, prednisone, Spiriva.  5. GERD-continue Protonix.  6. History of chronic systolic CHF-clinically patient is not in congestive heart failure,  7. Acute blood loss anemia-secondary to recent surgery. -Transfuse 1 unit of packed red blood cells and hemoglobin is stable. Continue to follow serial hemoglobins for now.  8. Constipation-continue MiraLAX, Senokot.  All the records are reviewed and case discussed with Care Management/Social Worker. Management plans discussed with the patient, family and they are in agreement.  CODE STATUS: Full  DVT Prophylaxis: Heparin drip/Coumadin  TOTAL TIME TAKING CARE OF THIS PATIENT: 30 minutes.   POSSIBLE D/C IN 1-2 DAYS, DEPENDING ON CLINICAL CONDITION.   Houston SirenSAINANI,Yahye Siebert J M.D on 05/12/2017 at 1:11 PM  Between 7am to 6pm - Pager - [phone]  After 6pm go to [email or web address] - Therapist, nutritionalpassword EPAS [facility]  [facility]  Office  [phone]  CC: Primary care physician; Elita QuickHill, [facility]

## 2017-05-12 NOTE — Consult Note (Signed)
ORTHOPAEDICS PROGRESS NOTE  PATIENT NAME: Cindy Weaver DOB: 09/26/1926  MRN: [id number]  POD # 1: Left hip hemiarthroplasty for femoral neck fracture  Subjective: Patient states that she rested well last PM. Pain has been well controlled. No nausea or vomiting.  Objective: Vital signs in last 24 hours: Temp:  [97.1 F (36.2 C)-99.3 F (37.4 C)] 98.1 F (36.7 C) (07/28 0000) Pulse Rate:  [88-114] 103 (07/28 0600) Resp:  [13-28] 14 (07/28 0600) BP: (82-105)/(48-68) 87/57 (07/28 0600) SpO2:  [87 %-100 %] 95 % (07/28 0600)  Intake/Output from previous day: 07/27 0701 - 07/28 0700 In: 4145 [I.V.:3375; Blood:330; IV Piggyback:350] Out: 320 [Urine:120; Blood:200]   Recent Labs  05/10/17 0306 06/24/2017 0210 05/12/17 0441  WBC 12.1* 14.6* 16.0*  HGB 7.9* 9.1* 10.1*  HCT 23.6* 27.1* 30.3*  PLT 202 194 167  K 3.9 4.0 4.5  CL 100* 103 103  CO2 28 29 26   BUN 46* 35* 42*  CREATININE 2.48* 1.87* 2.59*  GLUCOSE 99 102* 77  CALCIUM 8.0* 8.1* 8.2*  INR 1.33 1.38 1.55    EXAM General: Elderly female seen in no apparent discomfort. Arouses easily. Lungs: clear to auscultation Cardiac: Irregular rate and rhythm. Left lower extremity: Wound vac in place to left hip. No significant ecchymosis or swelling. Heel protector in place (Area of breakdown to heel & dorsal foot noted yesterday before surgery.) Homan test is negative. Neurologic: Awake, alert, and oriented. Sensory & motor function grossly intact.  Assessment: Left hip hemiarthroplasty for femoral neck fracture  Secondary diagnoses: S/p aortic valve replacement CAD Chronic systolic CHF Atrial fibrillation Acute renal failure  Anemia   Plan: Spoke with Pharmacy. Will be bridged on heparin until therapeutic on coumadin (INR 1.55 this AM). Creatinine up to 2.59 will little UOP last PM. Probable dialysis per Nephrology. Would like to begin PT today if OK with Medicine (to at least have patient up to a chair). Plan  is to go Skilled nursing facility after hospital stay.  Kenna Kirn P. Angie FavaHooten, Jr. M.D.

## 2017-05-12 NOTE — Anesthesia Postprocedure Evaluation (Signed)
Anesthesia Post Note  Patient: Cindy Weaver  Procedure(s) Performed: Procedure(s) (LRB): ARTHROPLASTY BIPOLAR HIP (HEMIARTHROPLASTY) (Left)  Patient location during evaluation: ICU Anesthesia Type: Spinal Level of consciousness: oriented and awake and alert Pain management: pain level controlled Vital Signs Assessment: post-procedure vital signs reviewed and stable Respiratory status: spontaneous breathing, respiratory function stable and patient connected to nasal cannula oxygen Cardiovascular status: blood pressure returned to baseline and stable Postop Assessment: no headache and no backache Anesthetic complications: no     Last Vitals:  Vitals:   05/12/17 0800 05/12/17 0900  BP: 116/60 91/60  Pulse: 100 (!) 108  Resp: 19 17  Temp: 36.7 C     Last Pain:  Vitals:   05/12/17 0800  TempSrc:   PainSc: 0-No pain                 Missie Gehrig

## 2017-05-12 NOTE — Clinical Social Work Note (Signed)
CSW received consult for possible SNF placement. CSW will follow pending PT recommendations.  Mozel Burdett Martha Sahra Converse, MSW, LCSWA [phone] 

## 2017-05-12 NOTE — Progress Notes (Signed)
ANTICOAGULATION CONSULT NOTE - Initial Consult  Pharmacy Consult for warfarin Indication: VTE prophylaxis and warfarin  Allergies  Allergen Reactions  . Penicillins Itching and Rash    Other reaction(s): RASH  . Enalapril Other (See Comments)    cough  . Statins Other (See Comments)    Other reaction(s): MUSCLE PAIN    Patient Measurements: Height: 5' 8.5" (174 cm) Weight: 153 lb 12.8 oz (69.8 kg) IBW/kg (Calculated) : 65.05  Vital Signs: Temp: 98.1 F (36.7 C) (07/28 0800) Temp Source: Axillary (07/28 0000) BP: 91/60 (07/28 0900) Pulse Rate: 108 (07/28 0900)  Labs:  Recent Labs  05/09/17 1658  05/10/17 0306 05/10/17 1615 04/19/2017 0210 05/12/17 0441  HGB  --   < > 7.9*  --  9.1* 10.1*  HCT  --   --  23.6*  --  27.1* 30.3*  PLT  --   --  202  --  194 167  LABPROT  --   --  16.6*  --  17.1* 18.7*  INR  --   --  1.33  --  1.38 1.55  HEPARINUNFRC 0.25*  --  <0.10* 0.29*  --   --   CREATININE  --   --  2.48*  --  1.87* 2.59*  < > = values in this interval not displayed.  Estimated Creatinine Clearance: 15.4 mL/min (A) (by C-G formula based on SCr of 2.59 mg/dL (H)).   Medical History: Past Medical History:  Diagnosis Date  . A-fib (HCC)    a. on Coumadin; b. CHADS2VASc => 5 (CHF, age x 2, vascular disease, female)  . Chronic systolic CHF (congestive heart failure) (HCC)   . Coronary artery disease    a. s/p CABG w/ SVG-PRDA  . S/P mitral valve clip implantation   . Status post aortic valve replacement     Assessment: Patient was taking warfarin PTA which is now being resumed post-op. Patient underwent surgery for femoral neck fracture today.   Warfarin has been held for the past 9 days and patient has received phytonadione the past 9 days. Patient was bridged with heparin drip prior to surgery.   Home dose = warfarin 2.5 mg PO daily INR today = 1.55 Patient received warfarin 5 mg PO on 7/27  7/27:        1.38; 5 mg  7/28:       1.55  Goal of Therapy:   INR 2-3 Monitor platelets by anticoagulation protocol: Yes   Plan:  Give warfarin 5 mg PO this evening. Patient also currently on heparin gtt for bridging.  Will check INR with AM labs tomorrow.  Cindy Weaver,Cindy Weaver, PharmD Clinical Pharmacist 05/12/2017,10:27 AM

## 2017-05-12 NOTE — Evaluation (Signed)
Occupational Therapy Evaluation Patient Details Name: Cindy Weaver MRN: [id number] DOB: 11/25/1925 Today's Date: 05/12/2017    History of Present Illness Pt is an 83yo female, POD1 s/p L hip hemiarthroplasty with complicated hospital stay. Pt transported to ED on 7/18 after fall at home, admitted to telemetry to help make pt more stable for surgery. Right femoral cath inserted on 7/23 and hemodialysis initiated on 7/24. L hip hemi performed 7/27 with pt transfered to ICU following surgery due to hypoxia.  PMHx significant for CAD s/p CABG, A-fib, chronic renal failure, on 2L O2 at home.   Clinical Impression   Pt seen for OT evaluation this date. Pt was ambulating independently (although endorses furniture walking, 2 falls in past 12 months due to a loss of balance), requiring assist for community mobility, groceries, cooking, cleaning, medication mgt, and laundry from friend who lives with her. Pt presents with significant generalized weakness, impaired activity tolerance, and impaired cardiopulmonary status leading to decreased independence with basic self care tasks and increased falls risk. Functional mobility deferred at this time due to right femoral catheter in place for hemodialysis. Pt will benefit from skilled OT services to address noted impairments and functional deficits including education/training in AE/DME for self care tasks, bed mobility training, fatigue mgt and energy conservation strategies, and falls prevention education to maximize return to PLOF and minimize risk of future falls/injury/rehospitalization. Recommend STR following hospitalization. Pt notes she strongly prefers to go home with home health. Based on current functional status and increased caregiver burden due to impairments and deficits, OT explained that STR is more appropriate at this time. Will continue to assess for appropriateness of HHOT as therapy progresses.     Follow Up Recommendations  SNF    Equipment  Recommendations  None recommended by OT    Recommendations for Other Services       Precautions / Restrictions Precautions Precautions: Fall Precaution Comments: R femoral cath for HD in place; possible posterior hip precautions given L hip hemiarthroplasty? Need to confirm when able to participate in more mobility Restrictions Weight Bearing Restrictions: Yes LLE Weight Bearing: Weight bearing as tolerated      Mobility Bed Mobility Overal bed mobility: Needs Assistance             General bed mobility comments: deferred due to R femoral catheter in place at this time.  Transfers                 General transfer comment: deferred due to R femoral catheter in place at this time.    Balance                                           ADL either performed or assessed with clinical judgement   ADL Overall ADL's : Needs assistance/impaired                                       General ADL Comments: pt generally max assist for all ADL at this time due to weakness and poor activity tolerance     Vision Baseline Vision/History: Wears glasses Wears Glasses: Reading only Patient Visual Report: No change from baseline Vision Assessment?: No apparent visual deficits     Perception     Praxis      Pertinent  Vitals/Pain Pain Assessment: No/denies pain     Hand Dominance Right   Extremity/Trunk Assessment Upper Extremity Assessment Upper Extremity Assessment: Generalized weakness (grossly 4-/5 bilaterally for shoulder flexion, 3/5 elbow extension, fair grip strength)   Lower Extremity Assessment Lower Extremity Assessment: Generalized weakness;Defer to PT evaluation (deferred BLE testing due to R femoral catheter in place)       Communication Communication Communication: No difficulties   Cognition Arousal/Alertness: Awake/alert (pt reports being very tired, keeps eyes closed for majority of evaluation, but continues to  participate appropriately) Behavior During Therapy: WFL for tasks assessed/performed Overall Cognitive Status: Within Functional Limits for tasks assessed                                     General Comments       Exercises Other Exercises Other Exercises: Pt/daughter educated in pursed lip breathing to support fatigue mgt/SOB with pt able to demo understanding   Shoulder Instructions      Home Living Family/patient expects to be discharged to:: Private residence Living Arrangements: Non-relatives/Friends Available Help at Discharge: Family;Available 24 hours/day Type of Home: House Home Access: Stairs to enter [employer] of Steps: 2 steps from garage w/ L handrail (preferred), 2 steps from front of house with bilat handrails Entrance Stairs-Rails: Right Home Layout: Two level Alternate Level Stairs-Number of Steps: bedrooms on 2nd floor, chair lift for stairs   Bathroom Shower/Tub: Walk-in shower;Door   [employer] Toilet: Handicapped height Bathroom Accessibility: Yes How Accessible: Accessible via walker Home Equipment: Shower seat - built in;Walker - 2 wheels;Cane - single point;Bedside commode;Grab bars - toilet;Grab bars - tub/shower;Hand held shower head;Wheelchair - manual   Additional Comments: has suction grab bars that can be installed in shower (not currently in place)      Prior Functioning/Environment Level of Independence: Needs assistance  Gait / Transfers Assistance Needed: endorses "furniture walking", no AD for mobility ADL's / Homemaking Assistance Needed: Pt able to perform bathing, dressing, toileting independently; friend staying with pt assisted with med mgt, cooking, cleaning, groceries, and laundry   Comments: Pt endorses 2 falls in past 12 months both due to a loss of balance        OT Problem List: Decreased strength;Cardiopulmonary status limiting activity;Decreased activity tolerance;Decreased knowledge of use of DME  or AE      OT Treatment/Interventions: Self-care/ADL training;Therapeutic exercise;Therapeutic activities;Energy conservation;DME and/or AE instruction;Patient/family education    OT Goals(Current goals can be found in the care plan section) Acute Rehab OT Goals Patient Stated Goal: get better and go home OT Goal Formulation: With patient/family Time For Goal Achievement: 05/26/17 Potential to Achieve Goals: Fair  OT Frequency: Min 2X/week   Barriers to D/C: Inaccessible home environment;Decreased caregiver support          Co-evaluation              AM-PAC PT "6 Clicks" Daily Activity     Outcome Measure Help from another person eating meals?: A Lot Help from another person taking care of personal grooming?: A Lot Help from another person toileting, which includes using toliet, bedpan, or urinal?: A Lot Help from another person bathing (including washing, rinsing, drying)?: A Lot Help from another person to put on and taking off regular upper body clothing?: A Lot Help from another person to put on and taking off regular lower body clothing?: A Lot 6 Click Score: 12   End  of Session    Activity Tolerance: Patient limited by fatigue Patient left: in bed;with call bell/phone within reach;with bed alarm set;with family/visitor present  OT Visit Diagnosis: Other abnormalities of gait and mobility (R26.89);Muscle weakness (generalized) (M62.81);History of falling (Z91.81)                Time: 9604-54091355-1412 OT Time Calculation (min): 17 min Charges:  OT General Charges $OT Visit: 1 Procedure OT Evaluation $OT Eval Moderate Complexity: 1 Procedure G-Codes:     Richrd PrimeJamie Stiller, MPH, MS, OTR/L ascom [phone] 05/12/17, 3:41 PM

## 2017-05-12 NOTE — Progress Notes (Addendum)
ANTICOAGULATION CONSULT NOTE -  Pharmacy Consult for Heparin Drip/Bridging   Indication: atrial fibrillation  Allergies  Allergen Reactions  . Penicillins Itching and Rash    Other reaction(s): RASH  . Enalapril Other (See Comments)    cough  . Statins Other (See Comments)    Other reaction(s): MUSCLE PAIN    Patient Measurements: Height: 5' 8.5" (174 cm) Weight: 153 lb 12.8 oz (69.8 kg) IBW/kg (Calculated) : 65.05 Vital Signs: Temp: 97.9 F (36.6 C) (07/28 1600) Temp Source: Axillary (07/28 1600) BP: 99/51 (07/28 1800) Pulse Rate: 113 (07/28 1800)  Labs:  Recent Labs  05/10/17 0306 05/10/17 1615 04/22/2017 0210 05/12/17 0441 05/12/17 1747  HGB 7.9*  --  9.1* 10.1*  --   HCT 23.6*  --  27.1* 30.3*  --   PLT 202  --  194 167  --   LABPROT 16.6*  --  17.1* 18.7*  --   INR 1.33  --  1.38 1.55  --   HEPARINUNFRC <0.10* 0.29*  --   --  0.10*  CREATININE 2.48*  --  1.87* 2.59*  --     Estimated Creatinine Clearance: 15.4 mL/min (A) (by C-G formula based on SCr of 2.59 mg/dL (H)).   Medical History: Past Medical History:  Diagnosis Date  . A-fib (HCC)    a. on Coumadin; b. CHADS2VASc => 5 (CHF, age x 2, vascular disease, female)  . Chronic systolic CHF (congestive heart failure) (HCC)   . Coronary artery disease    a. s/p CABG w/ SVG-PRDA  . S/P mitral valve clip implantation   . Status post aortic valve replacement     Assessment: 83 yo female with PMH of A.Fib, admitted with left femoral neck fracture secondary to fall. Patient takes warfarin, which has been placed on hold.  Pharmacy consulted for heparin dosing for bridge therapy for orthopedic surgery.   7/18 INR: 3.59 (Vit K 0.2 mg PO) 7/19 INR: 4.24 (Vit K 0.2 mg PO) 7/20 INR 3.29 (Vit K 1mg  PO) 7/21 INR 3.50 (Vit K 1mg  PO) 7/22 INR 3.30 (Vit K 1mg  PO) 7/23 INR 2.85 (Vit K 1 mg PO) 7/24: INR: 2.48  (Vit K 2.5 mg PO) 7/25: INR: 1.63 (Vit K 3 mg PO) 7/26: INR: 1.33 (Vit K 3 mg PO)   Goal of Therapy:   INR 2-3 Heparin level 0.3-0.7 units/ml Monitor platelets by anticoagulation protocol: Yes  Plan:   7/28:  HL @ 18:00 = 0.1  Will order Heparin 2000 units units IV X 1 bolus and increase drip rate to 1400 units/hr.  Will recheck HL 8 hrs after rate change.   Robbins,Jason D, PharmD Clinical Pharmacist 05/12/2017 7:17 PM    7/29 0300 heparin level 0.39. Continue current regimen. Recheck in 8 hours to confirm.   Fulton ReekMatt Jackson Fetters, PharmD, BCPS  05/13/17 3:28 AM

## 2017-05-12 NOTE — Consult Note (Signed)
PULMONARY / CRITICAL CARE MEDICINE   Name: DANAMARIE MINAMI MRN: [id number] DOB: March 24, 1926    ADMISSION DATE:  04/19/2017   CONSULTATION DATE:  05/07/2017  REFERRING MD:  04/20/2017  REASON: Acute respiratory failure  CHIEF COMPLAINT:  hypoxia  HISTORY OF PRESENT ILLNESS:   This is an 83 y/o female who was transferred to the ICU from the OR for hypoxia following a left hip hemiarthoplasty. Her CXR showed pulmonary edema and bilateral pleural effusions. She is currently on 4L [place]. Patient has baseline CKD stage III but her labs showed worsening kidney functions. She is still c/o mild dyspnea and a wet but minimally productive cough.   PAST MEDICAL HISTORY :  She  has a past medical history of A-fib (HCC); Chronic systolic CHF (congestive heart failure) (HCC); Coronary artery disease; S/P mitral valve clip implantation; and Status post aortic valve replacement.  PAST SURGICAL HISTORY: She  has a past surgical history that includes Coronary artery bypass graft; Aortic valve replacement; and Mitral valve repair.  Allergies  Allergen Reactions  . Penicillins Itching and Rash    Other reaction(s): RASH  . Enalapril Other (See Comments)    cough  . Statins Other (See Comments)    Other reaction(s): MUSCLE PAIN    No current facility-administered medications on file prior to encounter.    Current Outpatient Prescriptions on File Prior to Encounter  Medication Sig  . furosemide (LASIX) 40 MG tablet Take 80 mg by mouth daily.   . Multiple Vitamins-Minerals (MULTIVITAMIN WITH MINERALS) tablet Take 1 tablet by mouth daily.   . potassium chloride (K-DUR) 10 MEQ tablet Take 10 mEq by mouth daily.     FAMILY HISTORY:  Her indicated that the status of her mother is unknown.    SOCIAL HISTORY: She  reports that she quit smoking about 23 years ago. She has a 25.00 pack-year smoking history. She has quit using smokeless tobacco. She reports that she does not drink alcohol or use drugs.  REVIEW  OF SYSTEMS:   Constitutional: Negative for fever and chills.  HENT: Negative for congestion and rhinorrhea.  Eyes: Negative for redness and visual disturbance.  Respiratory: Positive for shortness of breath, cough but negative for wheezing.  Cardiovascular: Negative for chest pain and palpitations.  Gastrointestinal: Negative  for nausea , vomiting and abdominal pain and  Loose stools Genitourinary: Negative for dysuria and urgency.  Endocrine: Denies polyuria, polyphagia and heat intolerance Musculoskeletal: Negative for myalgias and arthralgias; pain with left leg movement.  Skin: Negative for pallor and wound.  Neurological: Negative for dizziness and headaches   SUBJECTIVE:   VITAL SIGNS: BP (!) 88/56   Pulse 99   Temp 98.1 F (36.7 C) (Axillary)   Resp 15   Ht 5' 8.5" (1.74 m)   Wt 153 lb 12.8 oz (69.8 kg)   SpO2 99%   BMI 23.05 kg/m   HEMODYNAMICS:    VENTILATOR SETTINGS:    INTAKE / OUTPUT: I/O last 3 completed shifts: In: 5990.8 [P.O.:100; I.V.:4900.8; Blood:650; Other:90; IV Piggyback:250] Out: 1520 [Urine:320; Other:1000; Blood:200]  PHYSICAL EXAMINATION: General: chronically ill looking Neuro:  AAOX 3, CN intact, no deficits HEENT:  PERRLA, mild JVD, trachea midline Cardiovascular:  AP tachycardic, regular, S1/S2, no MRG Lungs: Normal WOB, bilateral breath sounds, +rhonchi and crackles in all lung fields, breath sounds significantly diminished in the bases Abdomen:  Non-distended, normal bowel sounds, no organomegaly Musculoskeletal:  Left leg immobilized, left hip incision intact Skin:  Warm and dry  LABS:  BMET  Recent Labs Lab 05/09/17 0655 05/10/17 0306 04/23/2017 0210  NA 134* 137 140  K 4.7 3.9 4.0  CL 95* 100* 103  CO2 26 28 29   BUN 73* 46* 35*  CREATININE 3.07* 2.48* 1.87*  GLUCOSE 94 99 102*    Electrolytes  Recent Labs Lab 05/09/17 0655 05/10/17 0306 04/21/2017 0210  CALCIUM 8.3* 8.0* 8.1*  PHOS  --  2.2*  --      CBC  Recent Labs Lab 05/08/17 1058 05/10/17 0306 04/20/2017 0210  WBC 8.5 12.1* 14.6*  HGB 8.3* 7.9* 9.1*  HCT 24.3* 23.6* 27.1*  PLT 230 202 194    Coag's  Recent Labs Lab 05/09/17 0655 05/09/17 0815 05/10/17 0306 05/04/2017 0210  APTT  --  41*  --   --   INR 1.63  --  1.33 1.38    Sepsis Markers  Recent Labs Lab 05/06/17 0526 05/08/17 0552  PROCALCITON 3.35 1.67    ABG No results for input(s): PHART, PCO2ART, PO2ART in the last 168 hours.  Liver Enzymes  Recent Labs Lab 05/09/17 0655 05/10/17 0306  AST 23  --   ALT 15  --   ALKPHOS 73  --   BILITOT 0.7  --   ALBUMIN 2.3* 2.1*    Cardiac Enzymes No results for input(s): TROPONINI, PROBNP in the last 168 hours.  Glucose  Recent Labs Lab 04/22/2017 1638  GLUCAP 83    Imaging Dg Hip Port Unilat With Pelvis 1v Left  Result Date: 05/13/2017 CLINICAL DATA:  Postop left total hip arthroplasty. EXAM: DG HIP (WITH OR WITHOUT PELVIS) 1V PORT LEFT COMPARISON:  04/18/2017 radiographs. FINDINGS: 1117 hours. AP pelvis and cross-table lateral view of the left hip demonstrate interval left hip bipolar hemiarthroplasty. The hardware is well positioned. No evidence of acute fracture or dislocation. There is air within the joint and soft tissues surrounding the hip. Possible right femoral line. IMPRESSION: No demonstrated complication following left hip hemiarthroplasty. Electronically Signed   By: Carey BullocksWilliam  Veazey M.D.   On: 05/15/2017 11:37     ANTIBIOTICS: Azithromycin clindamycin   SIGNIFICANT EVENTS: 07/27>Left hemiarthroplasty  LINES/TUBES: PIVs Foley  DISCUSSION: 83 y/o female with a h/o afib, CAD, CKD and CHF presenting with acute hypoxic respiratory failure s/p left hemiarthroplasty  ASSESSMENT Acute hypoxic respiratory failure Bilateral pleural effusions Pulmonary edema Acute on chronic renal failure-decreased urine output Femoral neck fracture s/p Left hip hemiarthroplasty H/O Aortic  valve replacement H/o CHF H/O Anemia  PLAN Hemodynamics per ICU Supplemental O2 via [place] to keep SPO2>90% IV diuretics HD per nephrology CXR daily prn Monitor I/O Abx for prophylaxis as above Flutter valve IS   FAMILY  - Updates: No family at bedside. Will update when available  - Inter-disciplinary family meet or Palliative Care meeting due by:  day 7    Magdalene S. [facility] ANP-BC Pulmonary and Critical Care Medicine [facility] Pager [phone] or [phone]  05/12/2017, 4:40 AM   STAFF NOTE: I. Dr. Nicholos Johnsamachandran, have personally reviewed the patient's available data including medical history , events of notes, physican examination and test results as part of my evaluation. I have discussed with the  Care with the NP and other care providers including  pharmacist, ICU RN, RRT, dietary.  Physical Exam Lungs - Decreased air entry bilaterally r>l She is doing significantly better today, I personally reviewed. Chest x-ray images, there is a moderate right-sided pleural effusion, however, the patient's respiratory status appears to be fairly well compensated.  Discussed with nephrology,  the patient is likely going to be dialyzed tomorrow. We'll continue to monitor, the patient's respiratory status declines, we'll consider a thoracentesis, otherwise we'll continue to monitor.  Wells Guileseep Jamara Vary, MD.   Board Certified in Internal Medicine, Pulmonary Medicine, Critical Care Medicine, and Sleep Medicine.  [place] [facility] Office Number: [phone] Pager: [phone]  Santiago Gladavid Kasa, M.D.  Billy Fischeravid Simonds, M.D

## 2017-05-12 NOTE — Progress Notes (Signed)
[facility]  ROUNDING NOTE   Subjective:   Left hip surgery 7/27 POD 1  UOP 120  [place] 2 liters  Objective:  Vital signs in last 24 hours:  Temp:  [97.1 F (36.2 C)-99.3 F (37.4 C)] 98.1 F (36.7 C) (07/28 0000) Pulse Rate:  [88-114] 111 (07/28 0700) Resp:  [13-28] 16 (07/28 0700) BP: (82-105)/(48-68) 89/56 (07/28 0700) SpO2:  [87 %-100 %] 98 % (07/28 0700)  Weight change:  Filed Weights   05/10/17 0500 05/10/17 1050 05/05/2017 0509  Weight: 70.6 kg (155 lb 9.6 oz) 70.6 kg (155 lb 10.3 oz) 69.8 kg (153 lb 12.8 oz)    Intake/Output: I/O last 3 completed shifts: In: 7793 [I.V.:4108; Blood:330; Other:90; IV Piggyback:350] Out: 520 [Urine:320; Blood:200]   Intake/Output this shift:  No intake/output data recorded.  Physical Exam: General: NAD  Head: Normocephalic, atraumatic. Moist oral mucosal membranes  Eyes: Anicteric, PERRL  Neck: Supple, trachea midline  Lungs:  + bilateral crackles  Heart: irregular  Abdomen:  Soft, nontender  Extremities: no peripheral edema. Left foot in boot  Neurologic: Nonfocal, moving all four extremities  Skin: No lesions  Access: Right femoral temp HD cathter Dr. Lucky Cowboy 9/03    Basic Metabolic Panel:  Recent Labs Lab 05/08/17 0092 05/09/17 0655 05/10/17 0306 05/09/2017 0210 05/12/17 0441  NA 128* 134* 137 140 139  K 5.6* 4.7 3.9 4.0 4.5  CL 90* 95* 100* 103 103  CO2 23 26 28 29 26   GLUCOSE 101* 94 99 102* 77  BUN 90* 73* 46* 35* 42*  CREATININE 3.59* 3.07* 2.48* 1.87* 2.59*  CALCIUM 8.5* 8.3* 8.0* 8.1* 8.2*  MG  --   --   --   --  2.0  PHOS  --   --  2.2*  --  3.5    Liver Function Tests:  Recent Labs Lab 05/09/17 0655 05/10/17 0306  AST 23  --   ALT 15  --   ALKPHOS 73  --   BILITOT 0.7  --   PROT 5.5*  --   ALBUMIN 2.3* 2.1*   No results for input(s): LIPASE, AMYLASE in the last 168 hours. No results for input(s): AMMONIA in the last 168 hours.  CBC:  Recent Labs Lab 05/06/17 1757  05/08/17 1058 05/10/17 0306 05/03/2017 0210 05/12/17 0441  WBC 10.5 8.5 12.1* 14.6* 16.0*  HGB 9.3* 8.3* 7.9* 9.1* 10.1*  HCT 27.2* 24.3* 23.6* 27.1* 30.3*  MCV 96.4 94.4 95.4 90.0 89.5  PLT 244 230 202 194 167    Cardiac Enzymes: No results for input(s): CKTOTAL, CKMB, CKMBINDEX, TROPONINI in the last 168 hours.  BNP: Invalid input(s): POCBNP  CBG:  Recent Labs Lab 05/04/2017 1638  GLUCAP 83    Microbiology: Results for orders placed or performed during the hospital encounter of 05/10/2017  MRSA PCR Screening     Status: None   Collection Time: 05/04/17  8:27 AM  Result Value Ref Range Status   MRSA by PCR NEGATIVE NEGATIVE Final    Comment:        The GeneXpert MRSA Assay (FDA approved for NASAL specimens only), is one component of a comprehensive MRSA colonization surveillance program. It is not intended to diagnose MRSA infection nor to guide or monitor treatment for MRSA infections.   Culture, expectorated sputum-assessment     Status: None   Collection Time: 05/05/17 10:55 AM  Result Value Ref Range Status   Specimen Description SPUTUM  Final   Special Requests Normal  Final   Sputum evaluation THIS SPECIMEN IS ACCEPTABLE FOR SPUTUM CULTURE  Final   Report Status 05/05/2017 FINAL  Final  Culture, respiratory (NON-Expectorated)     Status: None   Collection Time: 05/05/17 10:55 AM  Result Value Ref Range Status   Specimen Description SPUTUM  Final   Special Requests Normal Reflexed from [id number]  Final   Gram Stain   Final    FEW WBC PRESENT, PREDOMINANTLY PMN FEW GRAM NEGATIVE RODS FEW GRAM POSITIVE COCCI IN PAIRS FEW GRAM POSITIVE RODS RARE BUDDING YEAST SEEN    Culture   Final    Consistent with normal respiratory flora. Performed at [facility], [place] [address] [place], [place] [address]    Report Status 04/23/2017 FINAL  Final    Coagulation Studies:  Recent Labs  05/10/17 0306 04/18/2017 0210 05/12/17 0441  LABPROT 16.6* 17.1* 18.7*   INR 1.33 1.38 1.55    Urinalysis: No results for input(s): COLORURINE, LABSPEC, PHURINE, GLUCOSEU, HGBUR, BILIRUBINUR, KETONESUR, PROTEINUR, UROBILINOGEN, NITRITE, LEUKOCYTESUR in the last 72 hours.  Invalid input(s): APPERANCEUR    Imaging: Dg Chest Port 1 View  Result Date: 05/12/2017 CLINICAL DATA:  Acute respiratory failure. EXAM: PORTABLE CHEST 1 VIEW COMPARISON:  05/09/2017 FINDINGS: Sequelae of prior CABG are again identified. The cardiac silhouette remains enlarged. Aortic atherosclerosis is noted. There is a moderate-sized right pleural effusion which appears to have increased in size with increasing right lower lobe opacity. A small left pleural effusion is again seen. Diffusely increased interstitial markings do not appear significantly changed. No pneumothorax is identified. IMPRESSION: 1. Enlarging right pleural effusion with worsening right basilar aeration. 2. Unchanged small left pleural effusion. 3. Unchanged interstitial densities which may reflect mild edema superimposed on chronic interstitial lung disease. Electronically Signed   By: Logan Bores M.D.   On: 05/12/2017 07:33   Dg Hip Port Unilat With Pelvis 1v Left  Result Date: 04/25/2017 CLINICAL DATA:  Postop left total hip arthroplasty. EXAM: DG HIP (WITH OR WITHOUT PELVIS) 1V PORT LEFT COMPARISON:  05/03/2017 radiographs. FINDINGS: 1117 hours. AP pelvis and cross-table lateral view of the left hip demonstrate interval left hip bipolar hemiarthroplasty. The hardware is well positioned. No evidence of acute fracture or dislocation. There is air within the joint and soft tissues surrounding the hip. Possible right femoral line. IMPRESSION: No demonstrated complication following left hip hemiarthroplasty. Electronically Signed   By: Richardean Sale M.D.   On: 05/13/2017 11:37     Medications:   . heparin 1,200 Units/hr (05/12/17 0753)   . albuterol  2.5 mg Nebulization Q4H  . amiodarone  200 mg Oral BID  . budesonide  (PULMICORT) nebulizer solution  0.5 mg Nebulization BID  . chlorhexidine  15 mL Mouth Rinse BID  . feeding supplement (ENSURE ENLIVE)  237 mL Oral TID BM  . ferrous sulfate  325 mg Oral Q breakfast  . fluticasone  1 spray Each Nare Daily  . guaiFENesin  1,200 mg Oral BID  . mouth rinse  15 mL Mouth Rinse q12n4p  . metoCLOPramide  10 mg Oral TID AC & HS  . multivitamin with minerals  1 tablet Oral Daily  . pantoprazole  40 mg Oral Daily  . polyethylene glycol  17 g Oral Daily  . predniSONE  20 mg Oral Q breakfast  . senna-docusate  1 tablet Oral BID  . tiotropium  18 mcg Inhalation Daily  . Warfarin - Pharmacist Dosing Inpatient   Does not apply [phone]  acetaminophen **OR** acetaminophen, bisacodyl, magnesium hydroxide, menthol-cetylpyridinium **OR** phenol, metoCLOPramide **OR** metoCLOPramide (REGLAN) injection, morphine injection, ondansetron **OR** ondansetron (ZOFRAN) IV, oxyCODONE, pneumococcal 23 valent vaccine, sodium phosphate, traMADol  Assessment/ Plan:  Ms. Cindy Weaver is a 83 y.o. black female with Chronic kidney disease stage III baseline EGFR 52, congestive heart failure, hyperlipidemia, disease status post CABG, anemia of chronic kidney disease, chronic atrial fibrillation, severe mitral regurgitation, hypertension, GERD, COPD, who was admitted to [facility] on 05/03/2017 for evaluation of Fall.  Patient developed left hip fracture and also had Atrial fibrillation with RVR and acute renal failure.  1. Acute renal failure on chronic kidney disease stage III: baseline creatinine 1.18, GFR of 52 from 04/01/17 Oliguric urine output.  Required Hemodialysis treatment on 7/24 and 7/26 High dose furosemide yesterday.  No acute indication for dialysis. Respiratory status improved. However with large right pleural effusion. Monitor respiratory status and need for dialysis.   2. Anemia of chronic kidney disease. Hemoglobin 10.1. Status post multiple transfusions this admission  3.  Hypotension with atrial fibrillation with rapid ventricular response:  - on amiodarone - Appreciate cardiology input. Monitor for exacerbation of congestive heart failure.     LOS: [place], Benjamin 7/28/20188:16 AM

## 2017-05-12 NOTE — Progress Notes (Signed)
SUBJECTIVE: The patient is still quite ill.  + difficulty with volume overload and shortness of breath  . albuterol  2.5 mg Nebulization Q4H  . amiodarone  200 mg Oral BID  . budesonide (PULMICORT) nebulizer solution  0.5 mg Nebulization BID  . chlorhexidine  15 mL Mouth Rinse BID  . feeding supplement (ENSURE ENLIVE)  237 mL Oral TID BM  . ferrous sulfate  325 mg Oral Q breakfast  . fluticasone  1 spray Each Nare Daily  . guaiFENesin  1,200 mg Oral BID  . mouth rinse  15 mL Mouth Rinse q12n4p  . metoCLOPramide  10 mg Oral TID AC & HS  . multivitamin with minerals  1 tablet Oral Daily  . pantoprazole  40 mg Oral Daily  . polyethylene glycol  17 g Oral Daily  . predniSONE  20 mg Oral Q breakfast  . senna-docusate  1 tablet Oral BID  . tiotropium  18 mcg Inhalation Daily  . warfarin  5 mg Oral ONCE-1800  . Warfarin - Pharmacist Dosing Inpatient   Does not apply q1800   . heparin 1,200 Units/hr (05/12/17 0753)    OBJECTIVE: Physical Exam: Vitals:   05/12/17 0900 05/12/17 1000 05/12/17 1100 05/12/17 1200  BP: 91/60 99/69 (!) 91/58 (!) 97/56  Pulse: (!) 108 (!) 112 (!) 114 (!) 102  Resp: 17 17 (!) 22 15  Temp:    97.8 F (36.6 C)  TempSrc:    Axillary  SpO2: 99%  92% 95%  Weight:      Height:        Intake/Output Summary (Last 24 hours) at 05/12/17 1247 Last data filed at 05/12/17 1200  Gross per 24 hour  Intake           3062.4 ml  Output              140 ml  Net           2922.4 ml    Telemetry reveals afib, V rates 110s  GEN- The patient is elderly and ill appearing, alert  Head- normocephalic, atraumatic Eyes-  Sclera clear, conjunctiva pink Ears- hearing intact Oropharynx- clear Neck- supple, + JVD Lungs- decreased BS at bases, normal work of breathing Heart- irregular rate and rhythm  GI- soft, NT, ND, + BS Extremities- no clubbing, cyanosis, or edema Skin- no rash or lesion Psych- euthymic mood, full affect Neuro- strength and sensation are  intact  LABS: Basic Metabolic Panel:  Recent Labs  40/98/1107/26/18 0306 04/17/2017 0210 05/12/17 0441  NA 137 140 139  K 3.9 4.0 4.5  CL 100* 103 103  CO2 28 29 26   GLUCOSE 99 102* 77  BUN 46* 35* 42*  CREATININE 2.48* 1.87* 2.59*  CALCIUM 8.0* 8.1* 8.2*  MG  --   --  2.0  PHOS 2.2*  --  3.5   Liver Function Tests:  Recent Labs  05/10/17 0306  ALBUMIN 2.1*   No results for input(s): LIPASE, AMYLASE in the last 72 hours. CBC:  Recent Labs  05/01/2017 0210 05/12/17 0441  WBC 14.6* 16.0*  HGB 9.1* 10.1*  HCT 27.1* 30.3*  MCV 90.0 89.5  PLT 194 167     RADIOLOGY: [place]  Result Date: 05/06/2017 CLINICAL DATA:  83 year old female with history of acute renal failure. EXAM: RENAL / URINARY TRACT ULTRASOUND COMPLETE COMPARISON:  None. FINDINGS: Right Kidney: Length: 9.7 cm. Echogenicity within normal limits. Severe cortical thinning. No hydronephrosis. Trace amount of perinephric fluid. Two small  anechoic lesions with increased through transmission in the upper and interpolar regions, measuring up to 1.5 x 1.3 x 1.9 cm. Left Kidney: Length: 11.2 cm. Echogenicity within normal limits. In the upper pole there is a 5.2 x 3.4 x 5.3 cm anechoic lesion with increased through transmission, compatible with a simple cyst. No mass hydronephrosis. Bladder: Completely decompressed with a Foley balloon catheter in place. IMPRESSION: 1. No hydronephrosis. 2. Diffuse cortical thinning in the right kidney. 3. Multiple small simple cysts in both kidneys. Electronically Signed   By: Trudie Reed M.D.   On: 05/12/2017 11:10   Dg Chest Port 1 View  Result Date: 05/12/2017 CLINICAL DATA:  Acute respiratory failure. EXAM: PORTABLE CHEST 1 VIEW COMPARISON:  05/09/2017 FINDINGS: Sequelae of prior CABG are again identified. The cardiac silhouette remains enlarged. Aortic atherosclerosis is noted. There is a moderate-sized right pleural effusion which appears to have increased in size with increasing right  lower lobe opacity. A small left pleural effusion is again seen. Diffusely increased interstitial markings do not appear significantly changed. No pneumothorax is identified. IMPRESSION: 1. Enlarging right pleural effusion with worsening right basilar aeration. 2. Unchanged small left pleural effusion. 3. Unchanged interstitial densities which may reflect mild edema superimposed on chronic interstitial lung disease. Electronically Signed   By: Sebastian Ache M.D.   On: 05/12/2017 07:33   Dg Chest Port 1 View  Result Date: 05/09/2017 CLINICAL DATA:  Followup cough, congestion and wheezing. EXAM: PORTABLE CHEST 1 VIEW COMPARISON:  05/03/2017.  05/08/2017. FINDINGS: Lung bases excluded from the film. Previous median sternotomy. Chronic cardiomegaly and aortic atherosclerosis. Enlarged right effusion with right lung volume loss. Volume loss also at the left base. Possible small or effusion on the left. Chronic interstitial lung markings probably related to a combination of chronic lung disease and mild edema. IMPRESSION: Effusions, right larger than left, with volume loss right more than left. Cardiomegaly and aortic atherosclerosis. Chronic lung disease and likely mild interstitial edema. Electronically Signed   By: Paulina Fusi M.D.   On: 05/09/2017 11:14   Dg Chest Port 1 View  Result Date: 05/03/2017 CLINICAL DATA:  83 y/o female with acute onset shortness of breath today. Hypoxia. EXAM: PORTABLE CHEST 1 VIEW COMPARISON:  04/15/2017 and earlier. FINDINGS: Portable AP upright view at 0801 hours. Stable large lung volumes. Stable cardiomegaly and mediastinal contours. Calcified aortic atherosclerosis. Mildly increased bilateral pulmonary interstitial opacity, which appears to be acute on chronic. No pneumothorax. No definite pleural effusion. No consolidation. Negative visible bowel gas pattern. Prior sternotomy. IMPRESSION: 1. Acute on chronic bilateral pulmonary interstitial opacity has mildly increased since  yesterday. Consider acute viral/atypical respiratory infection and interstitial edema. No definite pleural effusion. 2. Underlying pulmonary hyperinflation, cardiomegaly, Calcified aortic atherosclerosis. Electronically Signed   By: Odessa Fleming M.D.   On: 05/03/2017 08:52   Dg Chest Port 1 View  Result Date: 05/13/2017 CLINICAL DATA:  Fall EXAM: PORTABLE CHEST 1 VIEW COMPARISON:  Chest radiograph 11/24/2012 FINDINGS: Cardiomegaly is worsened from the prior study. There bilateral interstitial opacities, worst in the left lung base. No pneumothorax or sizable pleural effusion. No focal consolidation. IMPRESSION: Cardiomegaly, and calcific aortic atherosclerosis and suspected mild interstitial edema. Electronically Signed   By: Deatra Robinson M.D.   On: 04/30/2017 03:56   Dg Hip Port Unilat With Pelvis 1v Left  Result Date: 04/15/2017 CLINICAL DATA:  Postop left total hip arthroplasty. EXAM: DG HIP (WITH OR WITHOUT PELVIS) 1V PORT LEFT COMPARISON:  04/25/2017 radiographs. FINDINGS: 1117 hours.  AP pelvis and cross-table lateral view of the left hip demonstrate interval left hip bipolar hemiarthroplasty. The hardware is well positioned. No evidence of acute fracture or dislocation. There is air within the joint and soft tissues surrounding the hip. Possible right femoral line. IMPRESSION: No demonstrated complication following left hip hemiarthroplasty. Electronically Signed   By: Carey BullocksWilliam  Veazey M.D.   On: 05/09/2017 11:37   Dg Hip Unilat W Or Wo Pelvis 2-3 Views Left  Result Date: 04/23/2017 CLINICAL DATA:  Left hip pain after falling EXAM: DG HIP (WITH OR WITHOUT PELVIS) 2-3V LEFT COMPARISON:  None. FINDINGS: There is a transverse fracture of the left femoral neck with associated foreshortening. The femoral head remains approximated to the acetabulum. The right hip is unremarkable. No pelvic diastasis. IMPRESSION: Transverse fracture of the left femoral neck. Electronically Signed   By: Deatra RobinsonKevin  Herman M.D.   On:  04/23/2017 04:24    ASSESSMENT AND PLAN:   1. Chronic Afib Currently receiving amiodarone for rate control Also on heparin drip Would consider addition of carvedilol if her BP allows (currently BP is low) Would not advise digoxin given renal function Probably not a candidate for rhythm control  2. Acute on chronic systolic CHF: -Echo showed worsening ejection fraction of 35-40% - volume overloaded this am - diuresis limited by renal failure - would continue IV lasix as directed by nephrology  3. Elevated troponin: -Likely supply demand ischemia - conservative management  Prognosis is guarded at best She is at risk for further decompensation.  A high level of decision making was required for this encounter.   Hillis RangeJames Brenton Joines, MD 05/12/2017 12:47 PM

## 2017-05-12 NOTE — Plan of Care (Signed)
Problem: Pain Managment: Goal: General experience of comfort will improve Outcome: Progressing Pt was pain free overnight

## 2017-05-12 NOTE — Progress Notes (Addendum)
ANTICOAGULATION CONSULT NOTE -  Pharmacy Consult for Heparin Drip/Bridging   Indication: atrial fibrillation  Allergies  Allergen Reactions  . Penicillins Itching and Rash    Other reaction(s): RASH  . Enalapril Other (See Comments)    cough  . Statins Other (See Comments)    Other reaction(s): MUSCLE PAIN    Patient Measurements: Height: 5' 8.5" (174 cm) Weight: 153 lb 12.8 oz (69.8 kg) IBW/kg (Calculated) : 65.05 Vital Signs: Temp: 98.1 F (36.7 C) (07/28 0000) Temp Source: Axillary (07/28 0000) BP: 88/56 (07/28 0300) Pulse Rate: 99 (07/28 0300)  Labs:  Recent Labs  05/09/17 0815 05/09/17 1658  05/10/17 0306 05/10/17 1615 05/14/2017 0210 05/12/17 0441  HGB  --   --   < > 7.9*  --  9.1* 10.1*  HCT  --   --   --  23.6*  --  27.1* 30.3*  PLT  --   --   --  202  --  194 167  APTT 41*  --   --   --   --   --   --   LABPROT  --   --   --  16.6*  --  17.1* 18.7*  INR  --   --   --  1.33  --  1.38 1.55  HEPARINUNFRC  --  0.25*  --  <0.10* 0.29*  --   --   CREATININE  --   --   --  2.48*  --  1.87* 2.59*  < > = values in this interval not displayed.  Estimated Creatinine Clearance: 15.4 mL/min (A) (by C-G formula based on SCr of 2.59 mg/dL (H)).   Medical History: Past Medical History:  Diagnosis Date  . A-fib (HCC)    a. on Coumadin; b. CHADS2VASc => 5 (CHF, age x 2, vascular disease, female)  . Chronic systolic CHF (congestive heart failure) (HCC)   . Coronary artery disease    a. s/p CABG w/ SVG-PRDA  . S/P mitral valve clip implantation   . Status post aortic valve replacement     Assessment: 83 yo female with PMH of A.Fib, admitted with left femoral neck fracture secondary to fall. Patient takes warfarin, which has been placed on hold.  Pharmacy consulted for heparin dosing for bridge therapy for orthopedic surgery.   7/18 INR: 3.59 (Vit K 0.2 mg PO) 7/19 INR: 4.24 (Vit K 0.2 mg PO) 7/20 INR 3.29 (Vit K 1mg  PO) 7/21 INR 3.50 (Vit K 1mg  PO) 7/22 INR 3.30  (Vit K 1mg  PO) 7/23 INR 2.85 (Vit K 1 mg PO) 7/24: INR: 2.48  (Vit K 2.5 mg PO) 7/25: INR: 1.63 (Vit K 3 mg PO) 7/26: INR: 1.33 (Vit K 3 mg PO)   Goal of Therapy:  INR 2-3 Heparin level 0.3-0.7 units/ml Monitor platelets by anticoagulation protocol: Yes  Plan:  HL = 0.29 which is slightly subtherapeutic. Confirmed with RN that there have been no interruptions in infusion. Will give 1000 units bolus and increase infusion to 1200 units/hr. CBC to be ordered with AM labs. HL to be checked in 8 hours.  7/28 Heparin reordered to start at 1200 units/hr at 0800. Heparin level 8 hours after restart.  Erich MontaneMcBane,Sophie Quiles S, PharmD, BCPS Clinical Pharmacist 05/12/2017 5:49 AM

## 2017-05-12 NOTE — ED Provider Notes (Signed)
[facility] Emergency Department Provider Note    First MD Initiated Contact with Patient 04/19/2017 [phone]     (approximate)  I have reviewed the triage vital signs and the nursing notes.   HISTORY  Chief Complaint Fall and Cough   HPI Cindy Weaver is a 83 y.o. female with a low-dose of chronic medical conditions presents to the emergency Department after accidental fall patient states that she went to pick something up off the floor and then lost her balance. Patient does not admit to any loss of consciousness. Patient admits to 10 out of 10 left hip pain worse with any movement.   Past Medical History:  Diagnosis Date  . A-fib (HCC)    a. on Coumadin; b. CHADS2VASc => 5 (CHF, age x 2, vascular disease, female)  . Chronic systolic CHF (congestive heart failure) (HCC)   . Coronary artery disease    a. s/p CABG w/ SVG-PRDA  . S/P mitral valve clip implantation   . Status post aortic valve replacement     Patient Active Problem List   Diagnosis Date Noted  . Cough   . Non-intractable vomiting with nausea   . Acute renal failure (ARF) (HCC)   . Goals of care, counseling/discussion   . Palliative care encounter   . Malnutrition of moderate degree 05/04/2017  . Atrial fibrillation with RVR (HCC) 04/25/2017  . Hip fracture (HCC) 05/01/2017  . Acute on chronic systolic CHF (congestive heart failure) (HCC) 04/29/2017  . Acute on chronic respiratory failure with hypoxia (HCC) 05/06/2017  . Status post aortic valve replacement 04/17/2017  . Status post mitral valve repair 05/07/2017  . AKI (acute kidney injury) (HCC) 04/19/2017  . History of Coumadin therapy 04/17/2017  . Elevated troponin 04/29/2017  . Hypotension 05/15/2017  . A-fib (HCC) 06/17/2015  . Dental disease 06/17/2015  . MI (mitral incompetence) 04/25/2015  . Bursitis of knee 10/19/2014  . Anemia 02/18/2014  . Positional vertigo 12/15/2013  . CCF (congestive cardiac failure) (HCC)  05/22/2008    Past Surgical History:  Procedure Laterality Date  . AORTIC VALVE REPLACEMENT    . CORONARY ARTERY BYPASS GRAFT    . MITRAL VALVE REPAIR      Prior to Admission medications   Medication Sig Start Date End Date Taking? Authorizing Provider  ferrous sulfate 325 (65 FE) MG tablet Take 325 mg by mouth daily with breakfast.   Yes [provider]  fluticasone (FLONASE) 50 MCG/ACT nasal spray Place 1 spray into both nostrils daily.   Yes [provider]  Fluticasone-Salmeterol (ADVAIR) 250-50 MCG/DOSE AEPB Inhale 1 puff into the lungs 2 (two) times daily.   Yes [provider]  furosemide (LASIX) 40 MG tablet Take 80 mg by mouth daily.    Yes [provider]  metoprolol tartrate (LOPRESSOR) 25 MG tablet Take 25 mg by mouth 2 (two) times daily.    Yes [provider]  Multiple Vitamins-Minerals (MULTIVITAMIN WITH MINERALS) tablet Take 1 tablet by mouth daily.    Yes [provider]  pantoprazole (PROTONIX) 40 MG tablet Take 40 mg by mouth daily.   Yes [provider]  potassium chloride (K-DUR) 10 MEQ tablet Take 10 mEq by mouth daily.    Yes [provider]  Vitamin K, Phytonadione, 100 MCG TABS Take 200 mcg by mouth daily.   Yes [provider]  warfarin (COUMADIN) 2.5 MG tablet Take 2.5 mg by mouth daily.   Yes [provider]  Allergies Penicillins; Enalapril; and Statins  Family History  Problem Relation Age of Onset  . CAD Mother     Social History Social History  Substance Use Topics  . Smoking status: Former Smoker    Packs/day: 1.00    Years: 25.00    Quit date: 10/16/1993  . Smokeless tobacco: Former Neurosurgeon  . Alcohol use No    Review of Systems Constitutional: No fever/chills Eyes: No visual changes. ENT: No sore throat. Cardiovascular: Denies chest pain. Respiratory: Denies shortness of breath. Gastrointestinal: No abdominal pain.  No nausea, no vomiting.  No  diarrhea.  No constipation. Genitourinary: Negative for dysuria. Musculoskeletal: Negative for neck pain.  Negative for back pain.Positive for left hip pain Integumentary: Negative for rash. Neurological: Negative for headaches, focal weakness or numbness.   ____________________________________________   PHYSICAL EXAM:  VITAL SIGNS: ED Triage Vitals  Enc Vitals Group     BP 05/12/2017 0325 101/76     Pulse Rate 05/03/2017 0325 (!) 156     Resp 04/15/2017 0325 (!) 25     Temp 04/15/2017 0325 98.5 F (36.9 C)     Temp Source 05/11/2017 0325 Oral     SpO2 05/15/2017 0325 95 %     Weight 05/11/2017 0306 56.2 kg (124 lb)     Height 04/16/2017 0306 1.74 m (5' 8.5")     Head Circumference --      Peak Flow --      Pain Score 04/21/2017 0306 6     Pain Loc --      Pain Edu? --      Excl. in GC? --     Constitutional: Alert and oriented. Well appearing and in no acute distress. Eyes: Conjunctivae are normal. PERRL. EOMI. Head: Atraumatic. Mouth/Throat: Mucous membranes are moist. Neck: No stridor.  Cardiovascular:Tachycardia, regular rhythm Good peripheral circulation. Grossly normal heart sounds. Respiratory: Normal respiratory effort.  No retractions. Bibasilar rales Gastrointestinal: Soft and nontender. No distention.  Musculoskeletal: Left hip pain with palpation  Neurologic:  Normal speech and language. No gross focal neurologic deficits are appreciated.  Skin:  Skin is warm, dry and intact. No rash noted. Psychiatric: Mood and affect are normal. Speech and behavior are normal.  ____________________________________________   LABS (all labs ordered are listed, but only abnormal results are displayed)  Labs Reviewed  BASIC METABOLIC PANEL - Abnormal; Notable for the following:       Result Value   Chloride 99 (*)    Glucose, Bld 163 (*)    BUN 25 (*)    Creatinine, Ser 1.40 (*)    GFR calc non Af Amer 32 (*)    GFR calc Af Amer 38 (*)    All other components within normal limits    CBC - Abnormal; Notable for the following:    WBC 15.2 (*)    RBC 2.96 (*)    Hemoglobin 9.7 (*)    HCT 28.8 (*)    All other components within normal limits  TROPONIN I - Abnormal; Notable for the following:    Troponin I 0.03 (*)    All other components within normal limits  BRAIN NATRIURETIC PEPTIDE - Abnormal; Notable for the following:    B Natriuretic Peptide 485.0 (*)    All other components within normal limits  TSH - Abnormal; Notable for the following:    TSH 4.718 (*)    All other components within normal limits  PROTIME-INR - Abnormal; Notable for the following:    Prothrombin  Time 36.7 (*)    All other components within normal limits  TROPONIN I - Abnormal; Notable for the following:    Troponin I 0.13 (*)    All other components within normal limits  TROPONIN I - Abnormal; Notable for the following:    Troponin I 0.16 (*)    All other components within normal limits  TROPONIN I - Abnormal; Notable for the following:    Troponin I 0.16 (*)    All other components within normal limits  APTT - Abnormal; Notable for the following:    aPTT 78 (*)    All other components within normal limits  BASIC METABOLIC PANEL - Abnormal; Notable for the following:    Sodium 134 (*)    Chloride 94 (*)    Glucose, Bld 123 (*)    BUN 31 (*)    Creatinine, Ser 1.67 (*)    Calcium 8.5 (*)    GFR calc non Af Amer 26 (*)    GFR calc Af Amer 30 (*)    All other components within normal limits  CBC - Abnormal; Notable for the following:    WBC 12.8 (*)    RBC 2.84 (*)    Hemoglobin 9.2 (*)    HCT 27.0 (*)    All other components within normal limits  PROTIME-INR - Abnormal; Notable for the following:    Prothrombin Time 41.9 (*)    INR 4.24 (*)    All other components within normal limits  URINALYSIS, COMPLETE (UACMP) WITH MICROSCOPIC - Abnormal; Notable for the following:    Color, Urine YELLOW (*)    APPearance HAZY (*)    Hgb urine dipstick MODERATE (*)    Protein, ur 30  (*)    Leukocytes, UA SMALL (*)    Squamous Epithelial / LPF 0-5 (*)    All other components within normal limits  BASIC METABOLIC PANEL - Abnormal; Notable for the following:    Sodium 134 (*)    Chloride 93 (*)    Glucose, Bld 117 (*)    BUN 44 (*)    Creatinine, Ser 2.24 (*)    Calcium 8.8 (*)    GFR calc non Af Amer 18 (*)    GFR calc Af Amer 21 (*)    All other components within normal limits  CBC - Abnormal; Notable for the following:    WBC 14.6 (*)    RBC 2.97 (*)    Hemoglobin 9.4 (*)    HCT 28.3 (*)    All other components within normal limits  PROTIME-INR - Abnormal; Notable for the following:    Prothrombin Time 34.2 (*)    All other components within normal limits  PROTIME-INR - Abnormal; Notable for the following:    Prothrombin Time 36.0 (*)    All other components within normal limits  BASIC METABOLIC PANEL - Abnormal; Notable for the following:    Chloride 93 (*)    Glucose, Bld 131 (*)    BUN 58 (*)    Creatinine, Ser 2.45 (*)    Calcium 8.5 (*)    GFR calc non Af Amer 17 (*)    GFR calc Af Amer 19 (*)    All other components within normal limits  PROTIME-INR - Abnormal; Notable for the following:    Prothrombin Time 34.3 (*)    All other components within normal limits  CBC - Abnormal; Notable for the following:    RBC 2.52 (*)    Hemoglobin  8.3 (*)    HCT 24.2 (*)    All other components within normal limits  PROTEIN / CREATININE RATIO, URINE - Abnormal; Notable for the following:    Protein Creatinine Ratio 1.81 (*)    All other components within normal limits  PROTEIN ELECTROPHORESIS, SERUM - Abnormal; Notable for the following:    Total Protein ELP 5.1 (*)    Albumin ELP 2.2 (*)    All other components within normal limits  FERRITIN - Abnormal; Notable for the following:    Ferritin 395 (*)    All other components within normal limits  IRON AND TIBC - Abnormal; Notable for the following:    Iron 17 (*)    TIBC 98 (*)    All other  components within normal limits  CBC - Abnormal; Notable for the following:    RBC 2.82 (*)    Hemoglobin 9.3 (*)    HCT 27.2 (*)    All other components within normal limits  BASIC METABOLIC PANEL - Abnormal; Notable for the following:    Sodium 131 (*)    Potassium 5.3 (*)    Chloride 93 (*)    Glucose, Bld 140 (*)    BUN 72 (*)    Creatinine, Ser 3.17 (*)    Calcium 8.5 (*)    GFR calc non Af Amer 12 (*)    GFR calc Af Amer 14 (*)    All other components within normal limits  PROTIME-INR - Abnormal; Notable for the following:    Prothrombin Time 30.5 (*)    All other components within normal limits  BASIC METABOLIC PANEL - Abnormal; Notable for the following:    Sodium 133 (*)    Potassium 5.2 (*)    Chloride 93 (*)    Glucose, Bld 137 (*)    BUN 81 (*)    Creatinine, Ser 3.50 (*)    Calcium 8.5 (*)    GFR calc non Af Amer 11 (*)    GFR calc Af Amer 12 (*)    All other components within normal limits  PROTIME-INR - Abnormal; Notable for the following:    Prothrombin Time 27.3 (*)    All other components within normal limits  BASIC METABOLIC PANEL - Abnormal; Notable for the following:    Sodium 128 (*)    Potassium 5.6 (*)    Chloride 90 (*)    Glucose, Bld 101 (*)    BUN 90 (*)    Creatinine, Ser 3.59 (*)    Calcium 8.5 (*)    GFR calc non Af Amer 10 (*)    GFR calc Af Amer 12 (*)    All other components within normal limits  CBC - Abnormal; Notable for the following:    RBC 2.58 (*)    Hemoglobin 8.3 (*)    HCT 24.3 (*)    All other components within normal limits  PROTIME-INR - Abnormal; Notable for the following:    Prothrombin Time 19.5 (*)    All other components within normal limits  COMPREHENSIVE METABOLIC PANEL - Abnormal; Notable for the following:    Sodium 134 (*)    Chloride 95 (*)    BUN 73 (*)    Creatinine, Ser 3.07 (*)    Calcium 8.3 (*)    Total Protein 5.5 (*)    Albumin 2.3 (*)    GFR calc non Af Amer 13 (*)    GFR calc Af Amer 15  (*)  All other components within normal limits  HEPARIN LEVEL (UNFRACTIONATED) - Abnormal; Notable for the following:    Heparin Unfractionated 0.25 (*)    All other components within normal limits  APTT - Abnormal; Notable for the following:    aPTT 41 (*)    All other components within normal limits  PROTIME-INR - Abnormal; Notable for the following:    Prothrombin Time 16.6 (*)    All other components within normal limits  RENAL FUNCTION PANEL - Abnormal; Notable for the following:    Chloride 100 (*)    BUN 46 (*)    Creatinine, Ser 2.48 (*)    Calcium 8.0 (*)    Phosphorus 2.2 (*)    Albumin 2.1 (*)    GFR calc non Af Amer 16 (*)    GFR calc Af Amer 19 (*)    All other components within normal limits  CBC - Abnormal; Notable for the following:    WBC 12.1 (*)    RBC 2.47 (*)    Hemoglobin 7.9 (*)    HCT 23.6 (*)    All other components within normal limits  HEPARIN LEVEL (UNFRACTIONATED) - Abnormal; Notable for the following:    Heparin Unfractionated <0.10 (*)    All other components within normal limits  HEPARIN LEVEL (UNFRACTIONATED) - Abnormal; Notable for the following:    Heparin Unfractionated 0.29 (*)    All other components within normal limits  PROTIME-INR - Abnormal; Notable for the following:    Prothrombin Time 17.1 (*)    All other components within normal limits  CBC - Abnormal; Notable for the following:    WBC 14.6 (*)    RBC 3.01 (*)    Hemoglobin 9.1 (*)    HCT 27.1 (*)    RDW 17.8 (*)    All other components within normal limits  BASIC METABOLIC PANEL - Abnormal; Notable for the following:    Glucose, Bld 102 (*)    BUN 35 (*)    Creatinine, Ser 1.87 (*)    Calcium 8.1 (*)    GFR calc non Af Amer 23 (*)    GFR calc Af Amer 27 (*)    All other components within normal limits  PROTEIN ELECTRO, RANDOM URINE - Abnormal; Notable for the following:    M Component, Ur 8.0 (*)    All other components within normal limits  PROTIME-INR -  Abnormal; Notable for the following:    Prothrombin Time 18.7 (*)    All other components within normal limits  CBC - Abnormal; Notable for the following:    WBC 16.0 (*)    RBC 3.38 (*)    Hemoglobin 10.1 (*)    HCT 30.3 (*)    RDW 18.2 (*)    All other components within normal limits  BASIC METABOLIC PANEL - Abnormal; Notable for the following:    BUN 42 (*)    Creatinine, Ser 2.59 (*)    Calcium 8.2 (*)    GFR calc non Af Amer 15 (*)    GFR calc Af Amer 18 (*)    All other components within normal limits  MRSA PCR SCREENING  CULTURE, EXPECTORATED SPUTUM-ASSESSMENT  CULTURE, RESPIRATORY (NON-EXPECTORATED)  HEMOGLOBIN A1C  MAGNESIUM  MAGNESIUM  PROCALCITONIN  PROCALCITONIN  PROCALCITONIN  HEPATITIS B SURFACE ANTIGEN  HEPATITIS B SURFACE ANTIBODY  HEPATITIS B CORE ANTIBODY, TOTAL  HEPATITIS C ANTIBODY  GLUCOSE, CAPILLARY  MAGNESIUM  PHOSPHORUS  PROTEIN ELECTRO, RANDOM URINE  PTT FACTOR INHIBITOR (  MIXING STUDY)  HEPARIN LEVEL (UNFRACTIONATED)  TYPE AND SCREEN  PREPARE RBC (CROSSMATCH)  ABO/RH  PREPARE RBC (CROSSMATCH)  SURGICAL PATHOLOGY   ____________________________________________  EKG  ED ECG REPORT I, [place] N Gaylene Moylan, the attending physician, personally viewed and interpreted this ECG.   Date: 05/12/2017  EKG Time: 3:00 AM  Rate: 120  Rhythm: Atrial fibrillation with rapid ventricular response  Axis: Normal  Intervals: Normal  ST&T Change: None  ____________________________________________  RADIOLOGY I, [place] N Martesha Niedermeier, personally viewed and evaluated these images (plain radiographs) as part of my medical decision making, as well as reviewing the written report by the radiologist.  Dg Chest Port 1 View  Result Date: 05/12/2017 CLINICAL DATA:  Acute respiratory failure. EXAM: PORTABLE CHEST 1 VIEW COMPARISON:  05/09/2017 FINDINGS: Sequelae of prior CABG are again identified. The cardiac silhouette remains enlarged. Aortic atherosclerosis is noted.  There is a moderate-sized right pleural effusion which appears to have increased in size with increasing right lower lobe opacity. A small left pleural effusion is again seen. Diffusely increased interstitial markings do not appear significantly changed. No pneumothorax is identified. IMPRESSION: 1. Enlarging right pleural effusion with worsening right basilar aeration. 2. Unchanged small left pleural effusion. 3. Unchanged interstitial densities which may reflect mild edema superimposed on chronic interstitial lung disease. Electronically Signed   By: Sebastian AcheAllen  Grady M.D.   On: 05/12/2017 07:33   Dg Hip Port Unilat With Pelvis 1v Left  Result Date: 05/03/2017 CLINICAL DATA:  Postop left total hip arthroplasty. EXAM: DG HIP (WITH OR WITHOUT PELVIS) 1V PORT LEFT COMPARISON:  05/01/2017 radiographs. FINDINGS: 1117 hours. AP pelvis and cross-table lateral view of the left hip demonstrate interval left hip bipolar hemiarthroplasty. The hardware is well positioned. No evidence of acute fracture or dislocation. There is air within the joint and soft tissues surrounding the hip. Possible right femoral line. IMPRESSION: No demonstrated complication following left hip hemiarthroplasty. Electronically Signed   By: Carey BullocksWilliam  Veazey M.D.   On: 04/28/2017 11:37    ____________________________________________   PROCEDURES  Critical Care performed:CRITICAL CARE Performed by: Darci CurrentANDOLPH N Cydni Reddoch   Total critical care time: 45 minutes  Critical care time was exclusive of separately billable procedures and treating other patients.  Critical care was necessary to treat or prevent imminent or life-threatening deterioration.  Critical care was time spent personally by me on the following activities: development of treatment plan with patient and/or surrogate as well as nursing, discussions with consultants, evaluation of patient's response to treatment, examination of patient, obtaining history from patient or surrogate,  ordering and performing treatments and interventions, ordering and review of laboratory studies, ordering and review of radiographic studies, pulse oximetry and re-evaluation of patient's condition.   Procedures   ____________________________________________   INITIAL IMPRESSION / ASSESSMENT AND PLAN / ED COURSE  Pertinent labs & imaging results that were available during my care of the patient were reviewed by me and considered in my medical decision making (see chart for details).  [age] year old female with left hip fracture, pulmonary edema, atrial fibrillation with rapid ventricular response acute renal injury      ____________________________________________  FINAL CLINICAL IMPRESSION(S) / ED DIAGNOSES  Final diagnoses:  Hypoxia  Acute renal failure (ARF) (HCC)  Wheezing  Cough  Hip fracture (HCC)  Status post hip hemiarthroplasty  Acute respiratory failure (HCC)     MEDICATIONS GIVEN DURING THIS VISIT:  Medications  multivitamin with minerals tablet 1 tablet ( Oral MAR [place] 05/04/2017 1637)  pantoprazole (PROTONIX) EC tablet 40  mg ( Oral MAR Un[place] 04/26/2017 1637)  fluticasone (FLONASE) 50 MCG/ACT nasal spray 1 spray ( Each Nare MAR Unhold 05/01/2017 1637)  ferrous sulfate tablet 325 mg (325 mg Oral Given 05/12/17 0748)  amiodarone (NEXTERONE PREMIX) 360-4.14 MG/200ML-% (1.8 mg/mL) IV infusion (0 mg/hr Intravenous Stopped 05/06/2017 1300)  clindamycin (CLEOCIN) IVPB 600 mg (not administered)  amiodarone (NEXTERONE PREMIX) 360-4.14 MG/200ML-% (1.8 mg/mL) IV infusion (0 mg/hr Intravenous Stopped 05/03/17 1628)  feeding supplement (ENSURE ENLIVE) (ENSURE ENLIVE) liquid 237 mL (237 mLs Oral Given 04/24/2017 2028)  amiodarone (NEXTERONE PREMIX) 360-4.14 MG/200ML-% (1.8 mg/mL) IV infusion (0 mg/hr Intravenous Stopped 05/04/17 0100)  pneumococcal 23 valent vaccine (PNU-IMMUNE) injection 0.5 mL ( Intramuscular MAR [place] 05/08/2017 1637)  0.9 %  sodium chloride infusion ( Intravenous Stopped  05/05/17 0841)  budesonide (PULMICORT) nebulizer solution 0.5 mg (0.5 mg Nebulization Given 04/26/2017 1919)  predniSONE (DELTASONE) tablet 20 mg (20 mg Oral Given 05/12/17 0747)  tiotropium (SPIRIVA) inhalation capsule 18 mcg ( Inhalation MAR Unhold 04/29/2017 1637)  polyethylene glycol (MIRALAX / GLYCOLAX) packet 17 g ( Oral MAR Unhold 04/21/2017 1637)  cefTRIAXone (ROCEPHIN) 1 g in dextrose 5 % 50 mL IVPB ( Intravenous MAR Unhold 04/22/2017 1637)  amiodarone (PACERONE) tablet 200 mg (200 mg Oral Given 05/10/2017 2211)  albuterol (PROVENTIL) (2.5 MG/3ML) 0.083% nebulizer solution 2.5 mg (2.5 mg Nebulization Given 05/12/17 0435)  guaiFENesin (MUCINEX) 12 hr tablet 1,200 mg (1,200 mg Oral Given 04/20/2017 2211)  clindamycin (CLEOCIN) IVPB 600 mg (600 mg Intravenous Not Given 05/01/2017 1709)  heparin ADULT infusion 100 units/mL (25000 units/24050mL sodium chloride 0.45%) (0 Units/hr Intravenous Stopped 05/10/17 2355)  metoCLOPramide (REGLAN) tablet 10 mg (10 mg Oral Given 05/12/17 0747)  senna-docusate (Senokot-S) tablet 1 tablet (1 tablet Oral Given 05/09/2017 2211)  traMADol (ULTRAM) tablet 50-100 mg (not administered)  acetaminophen (TYLENOL) tablet 650 mg (not administered)    Or  acetaminophen (TYLENOL) suppository 650 mg (not administered)  oxyCODONE (Oxy IR/ROXICODONE) immediate release tablet 5-10 mg (not administered)  morphine 2 MG/ML injection 2 mg (not administered)  magnesium hydroxide (MILK OF MAGNESIA) suspension 30 mL (not administered)  bisacodyl (DULCOLAX) suppository 10 mg (not administered)  sodium phosphate (FLEET) 7-19 GM/118ML enema 1 enema (not administered)  ondansetron (ZOFRAN) tablet 4 mg (not administered)    Or  ondansetron (ZOFRAN) injection 4 mg (not administered)  metoCLOPramide (REGLAN) tablet 5-10 mg (not administered)    Or  metoCLOPramide (REGLAN) injection 5-10 mg (not administered)  menthol-cetylpyridinium (CEPACOL) lozenge 3 mg (not administered)    Or  phenol (CHLORASEPTIC)  mouth spray 1 spray (not administered)  sodium chloride 0.9 % injection (  Not Given 04/27/2017 1709)  Warfarin - Pharmacist Dosing Inpatient ( Does not apply Given 05/10/2017 1800)  heparin ADULT infusion 100 units/mL (25000 units/24850mL sodium chloride 0.45%) (1,200 Units/hr Intravenous New Bag/Given 05/12/17 0753)  chlorhexidine (PERIDEX) 0.12 % solution 15 mL (not administered)  MEDLINE mouth rinse (not administered)  diltiazem (CARDIZEM) injection 10 mg (10 mg Intravenous Given 05/15/2017 0410)  morphine 2 MG/ML injection 2 mg (2 mg Intravenous Given 04/29/2017 0503)  furosemide (LASIX) injection 40 mg (40 mg Intravenous Given 04/19/2017 0933)  digoxin (LANOXIN) 0.25 MG/ML injection 0.25 mg (0.25 mg Intravenous Given 05/06/2017 1510)  metoprolol tartrate (LOPRESSOR) injection 2.5 mg (2.5 mg Intravenous Given 05/03/17 0820)  sodium chloride 0.9 % bolus 250 mL (0 mLs Intravenous Stopped 05/04/17 1100)  sodium chloride 0.9 % bolus 500 mL (0 mLs Intravenous Stopped 05/06/17 1730)  tuberculin injection 5 Units (5 Units  Intradermal Given 05/08/17 0930)  phytonadione (VITAMIN K) oral solution 1 mg/0.5 mL (2 mg Oral Given 05/08/17 1503)  heparin bolus via infusion 1,000 Units (1,000 Units Intravenous Bolus from Bag 05/09/17 1909)  heparin bolus via infusion 2,000 Units (2,000 Units Intravenous Bolus from Bag 05/10/17 0503)  heparin bolus via infusion 1,000 Units (1,000 Units Intravenous Bolus from Bag 05/10/17 1746)  acetaminophen (OFIRMEV) IV 1,000 mg (0 mg Intravenous Stopped 05/12/17 0627)  clindamycin (CLEOCIN) IVPB 600 mg (0 mg Intravenous Stopped 04/18/2017 2312)  furosemide (LASIX) injection 5 mg (5 mg Intravenous Given 04/30/2017 1249)  azithromycin (ZITHROMAX) tablet 250 mg (250 mg Oral Given 04/20/2017 1828)  furosemide (LASIX) injection 10 mg (10 mg Intravenous Given 04/19/2017 1523)  furosemide (LASIX) injection 40 mg (40 mg Intravenous Given 05/10/2017 1622)  warfarin (COUMADIN) tablet 5 mg (5 mg Oral Given 04/26/2017 1829)    furosemide (LASIX) injection 40 mg (40 mg Intravenous Given 05/12/17 0032)     NEW OUTPATIENT MEDICATIONS STARTED DURING THIS VISIT:  Current Discharge Medication List      Current Discharge Medication List      Current Discharge Medication List       Note:  This document was prepared using Dragon voice recognition software and may include unintentional dictation errors.    Darci Current, MD 05/12/17 [phone]

## 2017-05-13 ENCOUNTER — Inpatient Hospital Stay: Payer: Medicare Other

## 2017-05-13 DIAGNOSIS — L8990 Pressure ulcer of unspecified site, unspecified stage: Secondary | ICD-10-CM | POA: Insufficient documentation

## 2017-05-13 LAB — BASIC METABOLIC PANEL
Anion gap: 9 (ref 5–15)
BUN: 58 mg/dL — AB (ref 6–20)
CALCIUM: 8.2 mg/dL — AB (ref 8.9–10.3)
CHLORIDE: 102 mmol/L (ref 101–111)
CO2: 26 mmol/L (ref 22–32)
CREATININE: 3.2 mg/dL — AB (ref 0.44–1.00)
GFR, EST AFRICAN AMERICAN: 14 mL/min — AB (ref >60)
GFR, EST NON AFRICAN AMERICAN: 12 mL/min — AB (ref >60)
Glucose, Bld: 104 mg/dL — ABNORMAL HIGH (ref 65–99)
Potassium: 4.5 mmol/L (ref 3.5–5.1)
SODIUM: 137 mmol/L (ref 135–145)

## 2017-05-13 LAB — CBC
HCT: 29.7 % — ABNORMAL LOW (ref 35.0–47.0)
HEMOGLOBIN: 9.8 g/dL — AB (ref 12.0–16.0)
MCH: 29.9 pg (ref 26.0–34.0)
MCHC: 32.9 g/dL (ref 32.0–36.0)
MCV: 90.7 fL (ref 80.0–100.0)
PLATELETS: 186 10*3/uL (ref 150–440)
RBC: 3.27 MIL/uL — AB (ref 3.80–5.20)
RDW: 18.1 % — ABNORMAL HIGH (ref 11.5–14.5)
WBC: 19.3 10*3/uL — ABNORMAL HIGH (ref 3.6–11.0)

## 2017-05-13 LAB — PROTIME-INR
INR: 3.35
PROTHROMBIN TIME: 34.7 s — AB (ref 11.4–15.2)

## 2017-05-13 LAB — HEPARIN LEVEL (UNFRACTIONATED): HEPARIN UNFRACTIONATED: 0.39 [IU]/mL (ref 0.30–0.70)

## 2017-05-13 MED ORDER — METOPROLOL TARTRATE 25 MG PO TABS
12.5000 mg | ORAL_TABLET | Freq: Two times a day (BID) | ORAL | Status: DC
  Administered 2017-05-13 (×2): 12.5 mg via ORAL
  Filled 2017-05-13 (×2): qty 1

## 2017-05-13 MED ORDER — ALBUTEROL SULFATE (2.5 MG/3ML) 0.083% IN NEBU
2.5000 mg | INHALATION_SOLUTION | Freq: Three times a day (TID) | RESPIRATORY_TRACT | Status: DC
  Administered 2017-05-13 – 2017-05-18 (×14): 2.5 mg via RESPIRATORY_TRACT
  Filled 2017-05-13 (×14): qty 3

## 2017-05-13 NOTE — Progress Notes (Signed)
Alert and Oriented.  No c/o pain. Monitors Afib with controlled rate. Heparin gtt Dc'd at 1000.  Pharmacy to dose coumadin today. Pt tires easily. Has productive cough of thick cream sputum. Lungs clear and diminished upper lobes and diminished in lower lobes. O2@ 3L/[place]. Wears home O2. Trialysis cath right groin without problems. Dr Wynelle LinkKolluru monitoring. May need HD tomorrow. Reported catheter may be switched from groin to neck for pt mobility needs. Creat increasing. Making some cloudy yellow urine.Foley cath. Appetite decreased but improving. On dysphagia thick diet.Left hip wound vac without drainage at 125mm suction. No neurovascular deficit noted to left leg. Seen by PT today. Seen by Dr Ardyth Manam, Dr Wynelle LinkKolluru, Dr Alison MurrayWooten,and Dr Cherlynn KaiserSainani. Report called to 1 C. Will transfer after she eats her lunch.  Family here and aware.

## 2017-05-13 NOTE — Plan of Care (Signed)
Problem: Pain Managment: Goal: General experience of comfort will improve Outcome: Progressing Pt has no complaints of pain   

## 2017-05-13 NOTE — Progress Notes (Addendum)
Subjective: 2 Days Post-Op Procedure(s) (LRB): ARTHROPLASTY BIPOLAR HIP (HEMIARTHROPLASTY) (Left) Patient not able at this time to rate pain 2/2 treatment We will start therapy today if cleared by medicine Plan is to go Rehab after hospital stay. no nausea and no vomiting Patient denies any chest pains or shortness of breath. Objective: Vital signs in last 24 hours: Temp:  [97.8 F (36.6 C)-98.6 F (37 C)] 98.2 F (36.8 C) (07/29 0715) Pulse Rate:  [96-114] 106 (07/29 0700) Resp:  [13-24] 15 (07/29 0700) BP: (87-113)/(51-95) 109/68 (07/29 0700) SpO2:  [87 %-99 %] 97 % (07/29 0700) FiO2 (%):  [28 %-36 %] 36 % (07/28 1616) Wound vac in place and working well No significant swelling to thigh Heels are non tender and elevated off the bed using rolled towels Intake/Output from previous day: 07/28 0701 - 07/29 0700 In: 612.7 [P.O.:354; I.V.:258.7] Out: 294 [Urine:294] Intake/Output this shift: No intake/output data recorded.   Recent Labs  05/08/2017 0210 05/12/17 0441 05/13/17 0252  HGB 9.1* 10.1* 9.8*    Recent Labs  05/12/17 0441 05/13/17 0252  WBC 16.0* 19.3*  RBC 3.38* 3.27*  HCT 30.3* 29.7*  PLT 167 186    Recent Labs  05/12/17 0441 05/13/17 0252  NA 139 137  K 4.5 4.5  CL 103 102  CO2 26 26  BUN 42* 58*  CREATININE 2.59* 3.20*  GLUCOSE 77 104*  CALCIUM 8.2* 8.2*    Recent Labs  05/12/17 0441 05/13/17 0252  INR 1.55 3.35    EXAM General - Patient is Alert Extremity - heels elevated off bed. Dressing - dressing C/D/I Motor Function - intact, moving foot and toes well on exam.    Past Medical History:  Diagnosis Date  . A-fib (HCC)    a. on Coumadin; b. CHADS2VASc => 5 (CHF, age x 2, vascular disease, female)  . Chronic systolic CHF (congestive heart failure) (HCC)   . Coronary artery disease    a. s/p CABG w/ SVG-PRDA  . S/P mitral valve clip implantation   . Status post aortic valve replacement     Assessment/Plan: 2 Days  Post-Op Procedure(s) (LRB): ARTHROPLASTY BIPOLAR HIP (HEMIARTHROPLASTY) (Left) Principal Problem:   Hip fracture (HCC) Active Problems:   Anemia   Atrial fibrillation with RVR (HCC)   Acute on chronic systolic CHF (congestive heart failure) (HCC)   Acute on chronic respiratory failure with hypoxia (HCC)   Status post aortic valve replacement   Status post mitral valve repair   AKI (acute kidney injury) (HCC)   History of Coumadin therapy   Elevated troponin   Hypotension   Malnutrition of moderate degree   Acute renal failure (ARF) (HCC)   Goals of care, counseling/discussion   Palliative care encounter   Cough   Non-intractable vomiting with nausea   Pressure injury of skin  Estimated body mass index is 23.05 kg/m as calculated from the following:   Height as of this encounter: 5' 8.5" (1.74 m).   Weight as of this encounter: 69.8 kg (153 lb 12.8 oz). Discharge to SNF when medically cleared  Labs: bun/creat going up. INR 3.35. Should be able to d/c heparin  DVT Prophylaxis - Coumadin Weight-Bearing as tolerated to left leg  Jon R. [facility] PA [facility] 05/13/2017, 8:12 AM  Addendum: Therapeutic on Coumadin (INR = 3.35). Heparin drip discontinued as per Dr. Nicholos Johnsamachandran. Patient is apparently on strict bedrest due to right femoral HD catheter. Subsequently, PT was limited to bed exercises this AM. Sherron MondaySpoke  with Dr. Wynelle LinkKolluru (Nephrology) who anticipates the possible need for dialysis this week. He will consider removing the femoral catheter and possibly converting her to a jugular HD catheter tomorrow so as to facilitate her ability to be up in a chair.  Large right pleural effusion on today's CXR. Medicine may repeat CXR in AM and consider thoracentesis.  Ansley Mangiapane P. Angie FavaHooten, Jr. M.D.

## 2017-05-13 NOTE — Progress Notes (Signed)
[employer] [facility]   PATIENT NAME: [name]    MR#:  [id number]  DATE OF BIRTH:  07/12/1926  SUBJECTIVE:   Status post left hip hemiarthroplasty, postop day #2. today.  No other acute events overnight. Making some urine and no plan for hemodialysis today. Remains in atrial fibrillation but rate controlled.  CXR this morning showing a significant right-sided pleural effusion.  REVIEW OF SYSTEMS:    Review of Systems  Constitutional: Negative for chills and fever.  HENT: Negative for congestion and tinnitus.   Eyes: Negative for blurred vision and double vision.  Respiratory: Negative for cough, shortness of breath and wheezing.   Cardiovascular: Negative for chest pain, orthopnea and PND.  Gastrointestinal: Negative for abdominal pain, diarrhea, nausea and vomiting.  Genitourinary: Negative for dysuria and hematuria.  Musculoskeletal: Positive for joint pain (Left Hip).  Neurological: Negative for dizziness, sensory change and focal weakness.  All other systems reviewed and are negative.   Nutrition: Dysphagia II with nectar thick liquids Tolerating Diet: Yes Tolerating PT: Await Eval.   DRUG ALLERGIES:   Allergies  Allergen Reactions  . Penicillins Itching and Rash    Other reaction(s): RASH  . Enalapril Other (See Comments)    cough  . Statins Other (See Comments)    Other reaction(s): MUSCLE PAIN    VITALS:  Blood pressure 100/62, pulse 100, temperature 98 F (36.7 C), temperature source Oral, resp. rate (!) 22, height 5' 8.5" (1.74 m), weight 69.8 kg (153 lb 12.8 oz), SpO2 93 %.  PHYSICAL EXAMINATION:   Physical Exam  GENERAL:  83 y.o.-year-old patient lying in bed in no acute distress.  EYES: Pupils equal, round, reactive to light and accommodation. No scleral icterus. Extraocular muscles intact.  HEENT: Head atraumatic, normocephalic. Oropharynx and nasopharynx clear.  NECK:  Supple, no jugular venous distention. No  thyroid enlargement, no tenderness.  LUNGS: Normal breath sounds bilaterally, no wheezing, rales, rhonchi. No use of accessory muscles of respiration.  CARDIOVASCULAR: S1, S2 Irregular. No murmurs, rubs, or gallops.  ABDOMEN: Soft, nontender, nondistended. Bowel sounds present. No organomegaly or mass.  EXTREMITIES: No cyanosis, clubbing or edema b/l. Left Hip dressing w/ wound vac in place.     NEUROLOGIC: Cranial nerves II through XII are intact. No focal Motor or sensory deficits b/l.  Globally weak.  PSYCHIATRIC: The patient is alert and oriented x 3.  SKIN: No obvious rash, lesion, or ulcer.    LABORATORY PANEL:   CBC  Recent Labs Lab 05/13/17 0252  WBC 19.3*  HGB 9.8*  HCT 29.7*  PLT 186   ------------------------------------------------------------------------------------------------------------------  Chemistries   Recent Labs Lab 05/09/17 0655  05/12/17 0441 05/13/17 0252  NA 134*  < > 139 137  K 4.7  < > 4.5 4.5  CL 95*  < > 103 102  CO2 26  < > 26 26  GLUCOSE 94  < > 77 104*  BUN 73*  < > 42* 58*  CREATININE 3.07*  < > 2.59* 3.20*  CALCIUM 8.3*  < > 8.2* 8.2*  MG  --   --  2.0  --   AST 23  --   --   --   ALT 15  --   --   --   ALKPHOS 73  --   --   --   BILITOT 0.7  --   --   --   < > = values in this interval not displayed. ------------------------------------------------------------------------------------------------------------------  Cardiac  Enzymes No results for input(s): TROPONINI in the last 168 hours. ------------------------------------------------------------------------------------------------------------------  RADIOLOGY:  Dg Chest 1 View  Result Date: 05/13/2017 CLINICAL DATA:  Shortness of Breath EXAM: CHEST 1 VIEW COMPARISON:  05/12/2017 FINDINGS: Prior CABG. Cardiomegaly. Diffuse interstitial prominence throughout the lungs compatible with interstitial edema. Bilateral pleural effusions, right greater than left. Bibasilar  atelectasis. IMPRESSION: Continued interstitial prominence, likely edema. Bilateral effusions, right greater than left with bibasilar atelectasis. Electronically Signed   By: Charlett NoseKevin  Dover M.D.   On: 05/13/2017 07:35   Dg Chest Port 1 View  Result Date: 05/12/2017 CLINICAL DATA:  Acute respiratory failure. EXAM: PORTABLE CHEST 1 VIEW COMPARISON:  05/09/2017 FINDINGS: Sequelae of prior CABG are again identified. The cardiac silhouette remains enlarged. Aortic atherosclerosis is noted. There is a moderate-sized right pleural effusion which appears to have increased in size with increasing right lower lobe opacity. A small left pleural effusion is again seen. Diffusely increased interstitial markings do not appear significantly changed. No pneumothorax is identified. IMPRESSION: 1. Enlarging right pleural effusion with worsening right basilar aeration. 2. Unchanged small left pleural effusion. 3. Unchanged interstitial densities which may reflect mild edema superimposed on chronic interstitial lung disease. Electronically Signed   By: Sebastian AcheAllen  Grady M.D.   On: 05/12/2017 07:33     ASSESSMENT AND PLAN:   83 year old female with past medical history of chronic afibrillation, history of coronary artery disease status post bypass, history of mitral levels placement, chronic respiratory failure, acute systolic CHF who presented to the hospital after a fall and noted to have left hip fracture.  1. Status post fall and left hip fracture-seen by orthopedics and is status post left hip hemiarthroplasty postoperative day #2 today. -Continue pain control as per orthopedics. Await physical therapy evaluation.  2. Atrial fibrillation with rapid ventricular response-rates are much improved now but she remains in atrial fibrillation. Initially was on amiodarone drip now weaned off of it. Continue oral amiodarone and metoprolol. -Appreciate cardiology input. Cont. Heparin bridge along with coumadin.  Follow INR.   3.  Acute kidney injury-baseline creatinine around 1.2-1.3. Suspect this is ATN from hypotension -Did not respond to fluids, therefore started on hemodialysis. Nephrology following, no acute indication for hemodialysis today and will continue to monitor.  Cr. Remains at 3.2.   4. Pleural effusion-patient's chest x-ray this morning showing a significant right-sided pleural effusion. Patient is not significantly hypoxic or complaining of shortness of breath. -We'll plan for possible right-sided thoracentesis for therapeutic purposes tomorrow  5. COPD-no acute exacerbation-continue albuterol nebulizers, prednisone, Spiriva.  6. GERD-continue Protonix.  7. Acute blood loss anemia-secondary to recent surgery. -Transfused 1 unit of packed red blood cells and hemoglobin is stable. Continue to follow serial hemoglobins for now.  8. Constipation-continue MiraLAX, Senokot.  Discussed plan of care with pt's daughter at bedside.   All the records are reviewed and case discussed with Care Management/Social Worker. Management plans discussed with the patient, family and they are in agreement.  CODE STATUS: Full  DVT Prophylaxis: Heparin drip/Coumadin  TOTAL TIME TAKING CARE OF THIS PATIENT: 30 minutes.   POSSIBLE D/C IN 1-2 DAYS, DEPENDING ON CLINICAL CONDITION.   Houston SirenSAINANI,VIVEK J M.D on 05/13/2017 at 1:48 PM  Between 7am to 6pm - Pager - [phone]  After 6pm go to [email or web address] - Therapist, nutritionalpassword EPAS [facility]  [facility]  Office  [phone]  CC: Primary care physician; Elita QuickHill, [facility]
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
## 2017-05-13 NOTE — Progress Notes (Signed)
[facility]  ROUNDING NOTE   Subjective:   Left hip surgery 7/27 POD 2  Working with physical therapy this morning.   Patient states she is breathing well. [place] 2 liters  UOP 294  Objective:  Vital signs in last 24 hours:  Temp:  [97.8 F (36.6 C)-98.6 F (37 C)] 98.2 F (36.8 C) (07/29 0715) Pulse Rate:  [96-114] 106 (07/29 0700) Resp:  [13-24] 15 (07/29 0700) BP: (87-113)/(51-95) 109/68 (07/29 0700) SpO2:  [87 %-99 %] 97 % (07/29 0700) FiO2 (%):  [36 %] 36 % (07/28 1616)  Weight change:  Filed Weights   05/10/17 0500 05/10/17 1050 04/26/2017 0509  Weight: 70.6 kg (155 lb 9.6 oz) 70.6 kg (155 lb 10.3 oz) 69.8 kg (153 lb 12.8 oz)    Intake/Output: I/O last 3 completed shifts: In: 762.7 [P.O.:354; I.V.:258.7; IV Piggyback:150] Out: 294 [Urine:294]   Intake/Output this shift:  No intake/output data recorded.  Physical Exam: General: NAD  Head: Normocephalic, atraumatic. Moist oral mucosal membranes  Eyes: Anicteric, PERRL  Neck: Supple, trachea midline  Lungs:  + bilateral crackles  Heart: irregular  Abdomen:  Soft, nontender  Extremities: no peripheral edema. Left foot in boot  Neurologic: Nonfocal, moving all four extremities  Skin: No lesions  Access: Right femoral temp HD cathter Dr. Lucky Cowboy 1/61    Basic Metabolic Panel:  Recent Labs Lab 05/09/17 0960 05/10/17 0306 04/29/2017 0210 05/12/17 0441 05/13/17 0252  NA 134* 137 140 139 137  K 4.7 3.9 4.0 4.5 4.5  CL 95* 100* 103 103 102  CO2 _0 GLUCOSE 94 99 102* 77 104*  BUN 73* 46* 35* 42* 58*  CREATININE 3.07* 2.48* 1.87* 2.59* 3.20*  CALCIUM 8.3* 8.0* 8.1* 8.2* 8.2*  MG  --   --   --  2.0  --   PHOS  --  2.2*  --  3.5  --     Liver Function Tests:  Recent Labs Lab 05/09/17 0655 05/10/17 0306  AST 23  --   ALT 15  --   ALKPHOS 73  --   BILITOT 0.7  --   PROT 5.5*  --   ALBUMIN 2.3* 2.1*   No results for input(s): LIPASE, AMYLASE in the last 168 hours. No results  for input(s): AMMONIA in the last 168 hours.  CBC:  Recent Labs Lab 05/08/17 1058 05/10/17 0306 04/21/2017 0210 05/12/17 0441 05/13/17 0252  WBC 8.5 12.1* 14.6* 16.0* 19.3*  HGB 8.3* 7.9* 9.1* 10.1* 9.8*  HCT 24.3* 23.6* 27.1* 30.3* 29.7*  MCV 94.4 95.4 90.0 89.5 90.7  PLT 230 202 194 167 186    Cardiac Enzymes: No results for input(s): CKTOTAL, CKMB, CKMBINDEX, TROPONINI in the last 168 hours.  BNP: Invalid input(s): POCBNP  CBG:  Recent Labs Lab 05/04/2017 1638 05/12/17 1614  GLUCAP 83 117*    Microbiology: Results for orders placed or performed during the hospital encounter of 04/21/2017  MRSA PCR Screening     Status: None   Collection Time: 05/04/17  8:27 AM  Result Value Ref Range Status   MRSA by PCR NEGATIVE NEGATIVE Final    Comment:        The GeneXpert MRSA Assay (FDA approved for NASAL specimens only), is one component of a comprehensive MRSA colonization surveillance program. It is not intended to diagnose MRSA infection nor to guide or monitor treatment for MRSA infections.   Culture, expectorated sputum-assessment     Status: None  Collection Time: 05/05/17 10:55 AM  Result Value Ref Range Status   Specimen Description SPUTUM  Final   Special Requests Normal  Final   Sputum evaluation THIS SPECIMEN IS ACCEPTABLE FOR SPUTUM CULTURE  Final   Report Status 05/05/2017 FINAL  Final  Culture, respiratory (NON-Expectorated)     Status: None   Collection Time: 05/05/17 10:55 AM  Result Value Ref Range Status   Specimen Description SPUTUM  Final   Special Requests Normal Reflexed from [id number]  Final   Gram Stain   Final    FEW WBC PRESENT, PREDOMINANTLY PMN FEW GRAM NEGATIVE RODS FEW GRAM POSITIVE COCCI IN PAIRS FEW GRAM POSITIVE RODS RARE BUDDING YEAST SEEN    Culture   Final    Consistent with normal respiratory flora. Performed at [facility], [place] [address] [place], [place] [address]    Report Status 05/13/2017 FINAL  Final     Coagulation Studies:  Recent Labs  05/03/2017 0210 05/12/17 0441 05/13/17 0252  LABPROT 17.1* 18.7* 34.7*  INR 1.38 1.55 3.35    Urinalysis: No results for input(s): COLORURINE, LABSPEC, PHURINE, GLUCOSEU, HGBUR, BILIRUBINUR, KETONESUR, PROTEINUR, UROBILINOGEN, NITRITE, LEUKOCYTESUR in the last 72 hours.  Invalid input(s): APPERANCEUR    Imaging: Dg Chest 1 View  Result Date: 05/13/2017 CLINICAL DATA:  Shortness of Breath EXAM: CHEST 1 VIEW COMPARISON:  05/12/2017 FINDINGS: Prior CABG. Cardiomegaly. Diffuse interstitial prominence throughout the lungs compatible with interstitial edema. Bilateral pleural effusions, right greater than left. Bibasilar atelectasis. IMPRESSION: Continued interstitial prominence, likely edema. Bilateral effusions, right greater than left with bibasilar atelectasis. Electronically Signed   By: Rolm Baptise M.D.   On: 05/13/2017 07:35   Dg Chest Port 1 View  Result Date: 05/12/2017 CLINICAL DATA:  Acute respiratory failure. EXAM: PORTABLE CHEST 1 VIEW COMPARISON:  05/09/2017 FINDINGS: Sequelae of prior CABG are again identified. The cardiac silhouette remains enlarged. Aortic atherosclerosis is noted. There is a moderate-sized right pleural effusion which appears to have increased in size with increasing right lower lobe opacity. A small left pleural effusion is again seen. Diffusely increased interstitial markings do not appear significantly changed. No pneumothorax is identified. IMPRESSION: 1. Enlarging right pleural effusion with worsening right basilar aeration. 2. Unchanged small left pleural effusion. 3. Unchanged interstitial densities which may reflect mild edema superimposed on chronic interstitial lung disease. Electronically Signed   By: Logan Bores M.D.   On: 05/12/2017 07:33   Dg Hip Port Unilat With Pelvis 1v Left  Result Date: 04/20/2017 CLINICAL DATA:  Postop left total hip arthroplasty. EXAM: DG HIP (WITH OR WITHOUT PELVIS) 1V PORT LEFT  COMPARISON:  05/01/2017 radiographs. FINDINGS: 1117 hours. AP pelvis and cross-table lateral view of the left hip demonstrate interval left hip bipolar hemiarthroplasty. The hardware is well positioned. No evidence of acute fracture or dislocation. There is air within the joint and soft tissues surrounding the hip. Possible right femoral line. IMPRESSION: No demonstrated complication following left hip hemiarthroplasty. Electronically Signed   By: Richardean Sale M.D.   On: 05/15/2017 11:37     Medications:   . heparin 1,400 Units/hr (05/13/17 0156)   . albuterol  2.5 mg Nebulization Q4H  . amiodarone  200 mg Oral BID  . budesonide (PULMICORT) nebulizer solution  0.5 mg Nebulization BID  . chlorhexidine  15 mL Mouth Rinse BID  . feeding supplement (ENSURE ENLIVE)  237 mL Oral TID BM  . ferrous sulfate  325 mg Oral Q breakfast  . fluticasone  1 spray  Each Nare Daily  . guaiFENesin  1,200 mg Oral BID  . mouth rinse  15 mL Mouth Rinse q12n4p  . metoCLOPramide  10 mg Oral TID AC & HS  . multivitamin with minerals  1 tablet Oral Daily  . pantoprazole  40 mg Oral Daily  . polyethylene glycol  17 g Oral Daily  . predniSONE  20 mg Oral Q breakfast  . senna-docusate  1 tablet Oral BID  . tiotropium  18 mcg Inhalation Daily  . Warfarin - Pharmacist Dosing Inpatient   Does not apply q1800   acetaminophen **OR** acetaminophen, bisacodyl, magnesium hydroxide, menthol-cetylpyridinium **OR** phenol, metoCLOPramide **OR** metoCLOPramide (REGLAN) injection, morphine injection, ondansetron **OR** ondansetron (ZOFRAN) IV, oxyCODONE, pneumococcal 23 valent vaccine, sodium phosphate, traMADol  Assessment/ Plan:  Cindy Weaver is a 83 y.o. black female with Chronic kidney disease stage III baseline EGFR 52, congestive heart failure, hyperlipidemia, disease status post CABG, anemia of chronic kidney disease, chronic atrial fibrillation, severe mitral regurgitation, hypertension, GERD, COPD,   Hospital  course, patient was admitted to [facility] on 04/25/2017 for evaluation of Fall with left hip fracture and also had Atrial fibrillation with RVR and acute renal failure. Hip surgery was postponed until 7/27 due to coagulopathy with elevated INR. Taken off warfarin, restarted after surgery. Now on heparin gtt.   1. Acute renal failure on chronic kidney disease stage III: baseline creatinine 1.18, GFR of 52 from 04/01/17 Oliguric urine output.  Required Hemodialysis treatment on 7/24 and 7/26 No acute indication for dialysis. Respiratory status improved. However with large right pleural effusion. Monitor respiratory status and need for dialysis.   2. Anemia of chronic kidney disease. Hemoglobin 9.8. Status post multiple transfusions this admission - iron supplements  3. Hypotension with atrial fibrillation with rapid ventricular response: not currently on pressors.  - on amiodarone - Appreciate cardiology input. Monitor for exacerbation of congestive heart failure.     LOS: [place], [place] 7/29/20189:12 AM

## 2017-05-13 NOTE — Plan of Care (Signed)
Problem: Nutrition: Goal: Adequate nutrition will be maintained Outcome: Not Progressing Pt still has poor appetite. Will drink ensure, on honey thick liquids.

## 2017-05-13 NOTE — Progress Notes (Signed)
ANTICOAGULATION CONSULT NOTE - Initial Consult  Pharmacy Consult for warfarin Indication: VTE prophylaxis and warfarin  Allergies  Allergen Reactions  . Penicillins Itching and Rash    Other reaction(s): RASH  . Enalapril Other (See Comments)    cough  . Statins Other (See Comments)    Other reaction(s): MUSCLE PAIN    Patient Measurements: Height: 5' 8.5" (174 cm) Weight: 153 lb 12.8 oz (69.8 kg) IBW/kg (Calculated) : 65.05  Vital Signs: Temp: 98.2 F (36.8 C) (07/29 0715) Temp Source: Oral (07/29 0715) BP: 95/58 (07/29 1000) Pulse Rate: 109 (07/29 1000)  Labs:  Recent Labs  05/10/17 1615  Jul 21, 2017 0210 05/12/17 0441 05/12/17 1747 05/13/17 0252  HGB  --   < > 9.1* 10.1*  --  9.8*  HCT  --   --  27.1* 30.3*  --  29.7*  PLT  --   --  194 167  --  186  LABPROT  --   --  17.1* 18.7*  --  34.7*  INR  --   --  1.38 1.55  --  3.35  HEPARINUNFRC 0.29*  --   --   --  0.10* 0.39  CREATININE  --   --  1.87* 2.59*  --  3.20*  < > = values in this interval not displayed.  Estimated Creatinine Clearance: 12.5 mL/min (A) (by C-G formula based on SCr of 3.2 mg/dL (H)).   Medical History: Past Medical History:  Diagnosis Date  . A-fib (HCC)    a. on Coumadin; b. CHADS2VASc => 5 (CHF, age x 2, vascular disease, female)  . Chronic systolic CHF (congestive heart failure) (HCC)   . Coronary artery disease    a. s/p CABG w/ SVG-PRDA  . S/P mitral valve clip implantation   . Status post aortic valve replacement     Assessment: Patient was taking warfarin PTA which is now being resumed post-op. Patient underwent surgery for femoral neck fracture today.   Warfarin has been held for the past 9 days and patient has received phytonadione the past 9 days. Patient was bridged with heparin drip prior to surgery.   Home dose = warfarin 2.5 mg PO daily INR today = 1.55 Patient received warfarin 5 mg PO on 7/27  7/27:        1.38; 5 mg  7/28:       1.55; 5 mg 7/29:      3.35   Goal of Therapy:  INR 2-3 Monitor platelets by anticoagulation protocol: Yes   Plan:  Heparin gtt discontinued by MD. Will hold warfarin therapy tonight. Will recheck INR with am labs.   Demetrius CharityJames,Mitchelle Goerner D, PharmD Clinical Pharmacist 05/13/2017,10:40 AM

## 2017-05-13 NOTE — Progress Notes (Signed)
Physical Therapy Treatment Patient Details Name: Cindy Weaver MRN: [id number] DOB: 11/02/1925 Today's Date: 05/13/2017    History of Present Illness Pt is an 83 yo female, s/p L hip hemiarthroplasty 7/27 with complicated hospital stay. Pt transported to ED on 7/18 after fall at home (sustained transverse fx L femoral neck), admitted to telemetry to help make pt more stable for surgery (d/t a-fib with RVR and acute on chronic CHF).  Right temporary femoral cath inserted on 7/23 and hemodialysis initiated on 7/24 d/t acute renal failure.  S/p 1 unit PRBC's transfusion 7/26.  L hip hemiarthroplasty performed 7/27 with pt transfered to ICU following surgery due to hypoxia (imaging showing enlarging R pleural effusion and unchanged small L pleural effusion).   PMHx significant for CAD s/p CABG, A-fib, chronic renal failure, on 2L O2 at home, bioprosthetic aortic valve replacement surgery, systolic CHF with EF 45%.    PT Comments    Pt able to tolerate some increase in repetitions for allowed LE ex's but overall session limited d/t pt fatigue (pt requiring pacing and rest breaks).  Pt still has R temporary fem dialysis cath in place (no plan to remove today per discussion with Dr. Wynelle LinkKolluru this morning) and per hospital protocol no R hip ROM and pt is on strict bedrest (mobility deferred during session d/t this).  Will continue to see pt QD but will increase daily frequency as appropriate once R temporary fem dialysis cath is removed and OOB mobility is able to be assessed/performed.  Continued focus will be on strengthening and improving activity tolerance with allowed bed level activities.    Follow Up Recommendations  SNF     Equipment Recommendations  Rolling walker with 5" wheels    Recommendations for Other Services       Precautions / Restrictions Precautions Precautions: Fall;Posterior Hip Precaution Comments: R temporary fem cath restrictions; L hip wound vac Restrictions Weight  Bearing Restrictions: Yes LLE Weight Bearing: Weight bearing as tolerated    Mobility  Bed Mobility               General bed mobility comments: deferred due to R temporary femoral dialysis catheter restrictions  Transfers                 General transfer comment: deferred due to R temporary femoral dialysis catheter restrictions  Ambulation/Gait             General Gait Details: deferred due to R temporary femoral dialysis catheter restrictions   Stairs            Wheelchair Mobility    Modified Rankin (Stroke Patients Only)       Balance Overall balance assessment:  (deferred d/t R temporary femoral dialysis catheter restrictions)                                          Cognition Arousal/Alertness: Awake/alert Behavior During Therapy: WFL for tasks assessed/performed Overall Cognitive Status: Within Functional Limits for tasks assessed                                        Exercises Total Joint Exercises Ankle Circles/Pumps: AROM;Strengthening;Both;10 reps;Supine Quad Sets: AROM;Strengthening;Both;Supine (2 sets of 10 reps B) Gluteal Sets: AROM;Strengthening;Both;10 reps;Supine Short Arc Quad: AAROM;Strengthening;Left;Supine (2 sets of 10  reps B) Heel Slides: AAROM;Strengthening;Left;Supine (2 sets of 10 reps B) Hip ABduction/ADduction: AAROM;Strengthening;Left;Supine (2 sets of 10 reps B) Straight Leg Raises: AAROM;Strengthening;Left;10 reps;Supine    General Comments General comments (skin integrity, edema, etc.): Pt resting in bed upon PT arrival.      Pertinent Vitals/Pain Pain Assessment: 0-10 Pain Score: 2  Pain Location: L hip Pain Descriptors / Indicators: Sore;Tender;Operative site guarding (with movement (none at rest)) Pain Intervention(s): Limited activity within patient's tolerance;Monitored during session;Repositioned  BP 88/59 beginning of session and 94/65 end of session (pt reports  h/o chronic low BP).  HR 106-123 bpm and O2 93% or greater on 4 L O2 via nasal cannula during session.    Home Living                      Prior Function            PT Goals (current goals can now be found in the care plan section) Acute Rehab PT Goals Patient Stated Goal: get better and go home PT Goal Formulation: With patient Time For Goal Achievement: 05/26/17 Potential to Achieve Goals: Fair Additional Goals Additional Goal #1: Assess OOB mobility once temporary fem dialysis cath is removed and update goals as appropriate Progress towards PT goals: Progressing toward goals    Frequency    7X/week      PT Plan Current plan remains appropriate    Co-evaluation              AM-PAC PT "6 Clicks" Daily Activity  Outcome Measure  Difficulty turning over in bed (including adjusting bedclothes, sheets and blankets)?: Total Difficulty moving from lying on back to sitting on the side of the bed? : Total Difficulty sitting down on and standing up from a chair with arms (e.g., wheelchair, bedside commode, etc,.)?: Total Help needed moving to and from a bed to chair (including a wheelchair)?: A Lot Help needed walking in hospital room?: Total Help needed climbing 3-5 steps with a railing? : Total 6 Click Score: 7    End of Session Equipment Utilized During Treatment: Oxygen (4 L O2 via nasal cannula) Activity Tolerance: Patient limited by fatigue Patient left: in bed;with call bell/phone within reach;with bed alarm set (B heels elevated via foam heel protectors) Nurse Communication: Mobility status;Precautions;Weight bearing status PT Visit Diagnosis: Muscle weakness (generalized) (M62.81);Other abnormalities of gait and mobility (R26.89);History of falling (Z91.81);Pain Pain - Right/Left: Left Pain - part of body: Hip     Time: [id number] PT Time Calculation (min) (ACUTE ONLY): 25 min  Charges:  $Therapeutic Exercise: 23-37 mins                    G  CodesHendricks Limes:       Yehudah Standing, PT 05/13/17, 9:45 AM [phone]

## 2017-05-14 ENCOUNTER — Inpatient Hospital Stay: Payer: Medicare Other

## 2017-05-14 ENCOUNTER — Encounter: Admission: EM | Disposition: E | Payer: Self-pay | Source: Home / Self Care | Attending: Internal Medicine

## 2017-05-14 LAB — BODY FLUID CELL COUNT WITH DIFFERENTIAL
Eos, Fluid: 0 %
Lymphs, Fluid: 14 %
Monocyte-Macrophage-Serous Fluid: 11 %
Neutrophil Count, Fluid: 75 %
Other Cells, Fluid: 0 %
WBC FLUID: 62 uL

## 2017-05-14 LAB — BASIC METABOLIC PANEL
Anion gap: 10 (ref 5–15)
BUN: 74 mg/dL — AB (ref 6–20)
CALCIUM: 8.2 mg/dL — AB (ref 8.9–10.3)
CO2: 25 mmol/L (ref 22–32)
CREATININE: 3.68 mg/dL — AB (ref 0.44–1.00)
Chloride: 102 mmol/L (ref 101–111)
GFR calc Af Amer: 12 mL/min — ABNORMAL LOW (ref >60)
GFR, EST NON AFRICAN AMERICAN: 10 mL/min — AB (ref >60)
GLUCOSE: 98 mg/dL (ref 65–99)
POTASSIUM: 4.8 mmol/L (ref 3.5–5.1)
Sodium: 137 mmol/L (ref 135–145)

## 2017-05-14 LAB — PROTIME-INR
INR: 4.92 — AB
INR: 4.41
PROTHROMBIN TIME: 47.2 s — AB (ref 11.4–15.2)
Prothrombin Time: 43.3 seconds — ABNORMAL HIGH (ref 11.4–15.2)

## 2017-05-14 LAB — CBC
HEMATOCRIT: 27.5 % — AB (ref 35.0–47.0)
HEMATOCRIT: 28.5 % — AB (ref 35.0–47.0)
HEMOGLOBIN: 9.5 g/dL — AB (ref 12.0–16.0)
Hemoglobin: 9.1 g/dL — ABNORMAL LOW (ref 12.0–16.0)
MCH: 29.8 pg (ref 26.0–34.0)
MCH: 29.8 pg (ref 26.0–34.0)
MCHC: 33.1 g/dL (ref 32.0–36.0)
MCHC: 33.4 g/dL (ref 32.0–36.0)
MCV: 89.9 fL (ref 80.0–100.0)
MCV: 89.4 fL (ref 80.0–100.0)
Platelets: 190 10*3/uL (ref 150–440)
Platelets: 200 10*3/uL (ref 150–440)
RBC: 3.06 MIL/uL — ABNORMAL LOW (ref 3.80–5.20)
RBC: 3.19 MIL/uL — ABNORMAL LOW (ref 3.80–5.20)
RDW: 18.2 % — AB (ref 11.5–14.5)
RDW: 18.2 % — ABNORMAL HIGH (ref 11.5–14.5)
WBC: 20.4 10*3/uL — ABNORMAL HIGH (ref 3.6–11.0)
WBC: 19.6 10*3/uL — ABNORMAL HIGH (ref 3.6–11.0)

## 2017-05-14 LAB — GLUCOSE, PLEURAL OR PERITONEAL FLUID: GLUCOSE FL: 112 mg/dL

## 2017-05-14 LAB — GRAM STAIN

## 2017-05-14 LAB — PTT FACTOR INHIBITOR (MIXING STUDY): APTT: 31.1 s — AB (ref 22.9–30.2)

## 2017-05-14 LAB — PATHOLOGIST SMEAR REVIEW

## 2017-05-14 SURGERY — DIALYSIS/PERMA CATHETER INSERTION
Anesthesia: Moderate Sedation

## 2017-05-14 MED ORDER — SODIUM CHLORIDE 0.9 % IV SOLN
0.0000 ug/min | INTRAVENOUS | Status: DC
  Filled 2017-05-14: qty 1

## 2017-05-14 MED ORDER — SODIUM CHLORIDE 0.9 % IV BOLUS (SEPSIS): 1000.0000 mL | Freq: Once | INTRAVENOUS | Status: DC

## 2017-05-14 MED ORDER — PHYTONADIONE 1 MG/0.5 ML ORAL SOLUTION
1.0000 mg | Freq: Once | ORAL | Status: DC
  Filled 2017-05-14: qty 0.5

## 2017-05-14 MED ORDER — PHYTONADIONE 1 MG/0.5 ML ORAL SOLUTION
0.2000 mg | Freq: Every day | ORAL | Status: DC
  Administered 2017-05-15 – 2017-05-17 (×3): 0.2 mg via ORAL
  Filled 2017-05-14 (×7): qty 0.5

## 2017-05-14 MED ORDER — PHYTONADIONE 1 MG/0.5 ML ORAL SOLUTION
2.0000 mg | Freq: Every day | ORAL | Status: DC
  Filled 2017-05-14: qty 1

## 2017-05-14 MED ORDER — PHENYLEPHRINE HCL 10 MG/ML IJ SOLN
0.0000 ug/min | Freq: Once | INTRAVENOUS | Status: DC
  Filled 2017-05-14: qty 1

## 2017-05-14 MED ORDER — SENNOSIDES-DOCUSATE SODIUM 8.6-50 MG PO TABS
2.0000 | ORAL_TABLET | Freq: Two times a day (BID) | ORAL | Status: DC
  Administered 2017-05-14 – 2017-05-17 (×4): 2 via ORAL
  Filled 2017-05-14 (×6): qty 2

## 2017-05-14 MED ORDER — AMIODARONE HCL IN DEXTROSE 360-4.14 MG/200ML-% IV SOLN
30.0000 mg/h | INTRAVENOUS | Status: DC
  Filled 2017-05-14: qty 200

## 2017-05-14 MED ORDER — PHYTONADIONE 1 MG/0.5 ML ORAL SOLUTION
3.0000 mg | Freq: Once | ORAL | Status: AC
  Administered 2017-05-14: 3 mg via ORAL
  Filled 2017-05-14: qty 1.5

## 2017-05-14 MED ORDER — GUAIFENESIN 100 MG/5ML PO SOLN
20.0000 mL | ORAL | Status: DC | PRN
  Administered 2017-05-14: 400 mg via ORAL
  Filled 2017-05-14 (×2): qty 20

## 2017-05-14 MED ORDER — AMIODARONE HCL IN DEXTROSE 360-4.14 MG/200ML-% IV SOLN
60.0000 mg/h | INTRAVENOUS | Status: DC
  Administered 2017-05-15 (×2): 60 mg/h via INTRAVENOUS
  Filled 2017-05-14 (×2): qty 200

## 2017-05-14 MED ORDER — AMIODARONE LOAD VIA INFUSION
150.0000 mg | Freq: Once | INTRAVENOUS | Status: AC
  Administered 2017-05-15: 150 mg via INTRAVENOUS
  Filled 2017-05-14 (×2): qty 83.34

## 2017-05-14 NOTE — Care Management (Signed)
[place] with patient pathways. Perm cath has been put on hold due to elevated INR. Nephrology to reevaluate patient for appropriateness of long term dialysis.CSW updated  RNCM following progression.

## 2017-05-14 NOTE — Progress Notes (Signed)
[facility]  ROUNDING NOTE   Subjective:   Left hip surgery 7/27    Underwent RIght thoracentesis. 850 cc of clear fluid was removed Patient continues to be Short of breath She has a cough Currently requiring [place] oxygen  Objective:  Vital signs in last 24 hours:  Temp:  [97.8 F (36.6 C)-97.9 F (36.6 C)] 97.8 F (36.6 C) (07/30 1355) Pulse Rate:  [87-104] 102 (07/30 1355) BP: (87-96)/(51-64) 88/53 (07/30 1355) SpO2:  [88 %-95 %] 93 % (07/30 1355) Weight:  [79.6 kg (175 lb 6.4 oz)] 79.6 kg (175 lb 6.4 oz) (07/30 0644)  Weight change:  Filed Weights   05/10/17 1050 04/22/2017 0509 04/16/2017 0644  Weight: 70.6 kg (155 lb 10.3 oz) 69.8 kg (153 lb 12.8 oz) 79.6 kg (175 lb 6.4 oz)    Intake/Output: I/O last 3 completed shifts: In: 927.3 [P.O.:720; I.V.:207.3] Out: 475 [Urine:475]   Intake/Output this shift:  No intake/output data recorded.  Physical Exam: General: NAD  Head: Normocephalic, atraumatic. Moist oral mucosal membranes  Eyes: Anicteric,    Neck: Supple,   Lungs:  + diffuse bilateral crackles  Heart: irregular  Abdomen:  Soft, nontender  Extremities: + dependent peripheral edema.   Neurologic: Able to answer few questions  Skin: Noacute lesions  Access: Right femoral temp HD cathter Dr. Lucky Cowboy 7/06    Basic Metabolic Panel:  Recent Labs Lab 05/10/17 0306 05/01/2017 0210 05/12/17 0441 05/13/17 0252 04/18/2017 0400  NA 137 140 139 137 137  K 3.9 4.0 4.5 4.5 4.8  CL 100* 103 103 102 102  CO2 _0 GLUCOSE 99 102* 77 104* 98  BUN 46* 35* 42* 58* 74*  CREATININE 2.48* 1.87* 2.59* 3.20* 3.68*  CALCIUM 8.0* 8.1* 8.2* 8.2* 8.2*  MG  --   --  2.0  --   --   PHOS 2.2*  --  3.5  --   --     Liver Function Tests:  Recent Labs Lab 05/09/17 0655 05/10/17 0306  AST 23  --   ALT 15  --   ALKPHOS 73  --   BILITOT 0.7  --   PROT 5.5*  --   ALBUMIN 2.3* 2.1*   No results for input(s): LIPASE, AMYLASE in the last 168 hours. No  results for input(s): AMMONIA in the last 168 hours.  CBC:  Recent Labs Lab 05/10/17 0306 04/24/2017 0210 05/12/17 0441 05/13/17 0252 04/28/2017 0400  WBC 12.1* 14.6* 16.0* 19.3* 20.4*  HGB 7.9* 9.1* 10.1* 9.8* 9.1*  HCT 23.6* 27.1* 30.3* 29.7* 27.5*  MCV 95.4 90.0 89.5 90.7 89.9  PLT 202 194 167 186 190    Cardiac Enzymes: No results for input(s): CKTOTAL, CKMB, CKMBINDEX, TROPONINI in the last 168 hours.  BNP: Invalid input(s): POCBNP  CBG:  Recent Labs Lab 04/22/2017 1638 05/12/17 1614  GLUCAP 83 117*    Microbiology: Results for orders placed or performed during the hospital encounter of 05/06/2017  MRSA PCR Screening     Status: None   Collection Time: 05/04/17  8:27 AM  Result Value Ref Range Status   MRSA by PCR NEGATIVE NEGATIVE Final    Comment:        The GeneXpert MRSA Assay (FDA approved for NASAL specimens only), is one component of a comprehensive MRSA colonization surveillance program. It is not intended to diagnose MRSA infection nor to guide or monitor treatment for MRSA infections.   Culture, expectorated sputum-assessment     Status:  None   Collection Time: 05/05/17 10:55 AM  Result Value Ref Range Status   Specimen Description SPUTUM  Final   Special Requests Normal  Final   Sputum evaluation THIS SPECIMEN IS ACCEPTABLE FOR SPUTUM CULTURE  Final   Report Status 05/05/2017 FINAL  Final  Culture, respiratory (NON-Expectorated)     Status: None   Collection Time: 05/05/17 10:55 AM  Result Value Ref Range Status   Specimen Description SPUTUM  Final   Special Requests Normal Reflexed from [id number]  Final   Gram Stain   Final    FEW WBC PRESENT, PREDOMINANTLY PMN FEW GRAM NEGATIVE RODS FEW GRAM POSITIVE COCCI IN PAIRS FEW GRAM POSITIVE RODS RARE BUDDING YEAST SEEN    Culture   Final    Consistent with normal respiratory flora. Performed at [facility], [place] [address] [place], [place] [address]    Report Status 04/29/2017 FINAL   Final    Coagulation Studies:  Recent Labs  05/12/17 0441 05/13/17 0252 05/12/2017 0400  LABPROT 18.7* 34.7* 47.2*  INR 1.55 3.35 4.92*    Urinalysis: No results for input(s): COLORURINE, LABSPEC, PHURINE, GLUCOSEU, HGBUR, BILIRUBINUR, KETONESUR, PROTEINUR, UROBILINOGEN, NITRITE, LEUKOCYTESUR in the last 72 hours.  Invalid input(s): APPERANCEUR    Imaging: Dg Chest 1 View  Result Date: 05/09/2017 CLINICAL DATA:  Right pleural effusion. EXAM: CHEST 1 VIEW COMPARISON:  Radiograph of May 13, 2017. FINDINGS: Stable cardiomegaly. Atherosclerosis of thoracic aorta is noted. Stable mild diffuse interstitial densities are noted throughout both lungs most consistent with scarring, although superimposed edema or inflammation cannot be excluded. No pneumothorax is noted. Minimal bilateral pleural effusions are noted. Sternotomy wires are noted. IMPRESSION: Aortic atherosclerosis. Stable mild diffuse interstitial densities throughout both lungs most consistent with scarring, although superimposed edema or inflammation cannot be excluded. Minimal bilateral pleural effusions. Electronically Signed   By: Marijo Conception, M.D.   On: 04/17/2017 12:14   Dg Chest 1 View  Result Date: 05/13/2017 CLINICAL DATA:  Shortness of Breath EXAM: CHEST 1 VIEW COMPARISON:  05/12/2017 FINDINGS: Prior CABG. Cardiomegaly. Diffuse interstitial prominence throughout the lungs compatible with interstitial edema. Bilateral pleural effusions, right greater than left. Bibasilar atelectasis. IMPRESSION: Continued interstitial prominence, likely edema. Bilateral effusions, right greater than left with bibasilar atelectasis. Electronically Signed   By: Rolm Baptise M.D.   On: 05/13/2017 07:35   Dg Chest Port 1 View  Result Date: 05/01/2017 CLINICAL DATA:  Left hip hemiarthroplasty. Postoperative day 3. Prior thoracentesis. EXAM: PORTABLE CHEST 1 VIEW COMPARISON:  Ultrasound 05/09/2017. Chest x-ray 04/24/2017, 05/13/2017.  FINDINGS: Prior CABG. Cardiomegaly with slight increase pulmonary interstitial prominence and bilateral pleural effusions. Findings suggest congestive heart failure. No pneumothorax IMPRESSION: 1. Prior CABG. Cardiomegaly with bilateral increased pulmonary interstitial prominence and bilateral pleural effusions most consistent with CHF. 2. No pneumothorax . Electronically Signed   By: Marcello Moores  Register   On: 05/13/2017 14:41   US Thoracentesis Asp Pleural Space W/img Guide  Result Date: 05/10/2017 INDICATION: Pleural effusions, larger on the right EXAM: ULTRASOUND GUIDED RIGHT THORACENTESIS MEDICATIONS: 1% lidocaine local COMPLICATIONS: None immediate. PROCEDURE: An ultrasound guided thoracentesis was thoroughly discussed with the patient and questions answered. The benefits, risks, alternatives and complications were also discussed. The patient understands and wishes to proceed with the procedure. Written consent was obtained. Ultrasound was performed to localize and mark an adequate pocket of fluid in the right chest. The area was then prepped and draped in the normal sterile fashion. 1% Lidocaine was used for local  anesthesia. Under ultrasound guidance a 6 Fr Safe-T-Centesis catheter was introduced. Thoracentesis was performed. The catheter was removed and a dressing applied. FINDINGS: A total of approximately 850 cc of clear serosanguineous fluid was removed. Samples were sent to the laboratory as requested by the clinical team. IMPRESSION: Successful ultrasound guided right thoracentesis yielding 850 cc of pleural fluid. Electronically Signed   By: Jerilynn Mages.  Shick M.D.   On: 05/15/2017 11:33     Medications:    . albuterol  2.5 mg Nebulization TID  . amiodarone  200 mg Oral BID  . budesonide (PULMICORT) nebulizer solution  0.5 mg Nebulization BID  . chlorhexidine  15 mL Mouth Rinse BID  . feeding supplement (ENSURE ENLIVE)  237 mL Oral TID BM  . ferrous sulfate  325 mg Oral Q breakfast  . fluticasone   1 spray Each Nare Daily  . mouth rinse  15 mL Mouth Rinse q12n4p  . metoprolol tartrate  12.5 mg Oral BID  . multivitamin with minerals  1 tablet Oral Daily  . pantoprazole  40 mg Oral Daily  . [START ON 05/15/2017] phytonadione  0.2 mg Oral Daily  . polyethylene glycol  17 g Oral Daily  . predniSONE  20 mg Oral Q breakfast  . senna-docusate  2 tablet Oral BID  . tiotropium  18 mcg Inhalation Daily  . Warfarin - Pharmacist Dosing Inpatient   Does not apply q1800   acetaminophen **OR** acetaminophen, bisacodyl, guaiFENesin, magnesium hydroxide, menthol-cetylpyridinium **OR** phenol, metoCLOPramide **OR** metoCLOPramide (REGLAN) injection, morphine injection, ondansetron **OR** ondansetron (ZOFRAN) IV, oxyCODONE, pneumococcal 23 valent vaccine, traMADol  Assessment/ Plan:  Ms. Cindy Weaver is a 83 y.o. black female with Chronic kidney disease stage III baseline EGFR 52, congestive heart failure, hyperlipidemia, disease status post CABG, anemia of chronic kidney disease, chronic atrial fibrillation, severe mitral regurgitation, hypertension, GERD, COPD,   Hospital course, patient was admitted to [facility] on 04/25/2017 for evaluation of Fall with left hip fracture and also had Atrial fibrillation with RVR and acute renal failure. Hip surgery was postponed until 7/27 due to coagulopathy with elevated INR. Taken off warfarin, restarted after surgery. Now on heparin gtt.   1. Acute renal failure on chronic kidney disease stage III: baseline creatinine 1.18, GFR of 52 from 04/01/17 Oliguric urine output.  Required Hemodialysis treatment on 7/24 and 7/26 Respiratory status is still tenuous and she is volume overload. Will hold off removing dialysis cathter as she may need more treatments if her UOP doesn't improve - permcath placement was deferred due to high INR - Low BP is limiting fluid removal with HD  2. Anemia of chronic kidney disease. Hemoglobin 9.1. Status post multiple transfusions this  admission - iron supplements  3. Hypotension with atrial fibrillation with rapid ventricular response: not currently on pressors.  - on amiodarone  4. LE edema - as above      LOS: 12 Dvaughn Fickle 7/30/20184:45 PM

## 2017-05-14 NOTE — Progress Notes (Signed)
Physical Therapy Treatment Patient Details Name: Cindy Weaver MRN: [id number] DOB: 08/31/1926 Today's Date: 05/13/2017    History of Present Illness Pt is an 83 yo female, s/p L hip hemiarthroplasty 7/27 with complicated hospital stay. Pt transported to ED on 7/18 after fall at home (sustained transverse fx L femoral neck), admitted to telemetry to help make pt more stable for surgery (d/t a-fib with RVR and acute on chronic CHF).  Right temporary femoral cath inserted on 7/23 and hemodialysis initiated on 7/24 d/t acute renal failure.  S/p 1 unit PRBC's transfusion 7/26.  L hip hemiarthroplasty performed 7/27 with pt transfered to ICU following surgery due to hypoxia (imaging showing enlarging R pleural effusion and unchanged small L pleural effusion).   PMHx significant for CAD s/p CABG, A-fib, chronic renal failure, on 2L O2 at home, bioprosthetic aortic valve replacement surgery, systolic CHF with EF 45%.    PT Comments    Pt appearing fatigued this morning and pt reporting poor sleep d/t coughing last night and not being able to sleep well.  Nursing and pt report plan for thoracentesis this morning.  Pt fatigued quickly with allowed LE ex's in bed this morning limiting session's activities.  Pt still has R temporary fem dialysis cath in place and per hospital protocol no R hip ROM and pt is on strict bedrest (mobility deferred during session d/t this). Will continue to see pt QD but will increase daily frequency as appropriate once R temporary fem dialysis cath is removed and OOB mobility is able to be assessed/performed.  Will continue to focus on strengthening and improving activity tolerance with allowed bed level exercises.   Follow Up Recommendations  SNF     Equipment Recommendations  Rolling walker with 5" wheels    Recommendations for Other Services       Precautions / Restrictions Precautions Precautions: Fall;Posterior Hip Precaution Comments: R temporary fem cath  restrictions; L hip wound vac Restrictions Weight Bearing Restrictions: Yes LLE Weight Bearing: Weight bearing as tolerated    Mobility  Bed Mobility               General bed mobility comments: deferred due to R temporary femoral dialysis catheter restrictions  Transfers                 General transfer comment: deferred due to R temporary femoral dialysis catheter restrictions  Ambulation/Gait             General Gait Details: deferred due to R temporary femoral dialysis catheter restrictions   Stairs            Wheelchair Mobility    Modified Rankin (Stroke Patients Only)       Balance Overall balance assessment:  (deferred d/t R temporary femoral dialysis catheter restrictions)                                          Cognition Arousal/Alertness: Awake/alert Behavior During Therapy: WFL for tasks assessed/performed Overall Cognitive Status: Within Functional Limits for tasks assessed                                        Exercises Total Joint Exercises Ankle Circles/Pumps: AROM;Strengthening;Both;10 reps;Supine Quad Sets: AROM;Strengthening;Both;10 reps;Supine Gluteal Sets: AROM;Strengthening;Both;10 reps;Supine Short Arc Quad: AAROM;Strengthening;Left;10 reps;Supine Heel  Slides: AAROM;Strengthening;Left;10 reps;Supine Hip ABduction/ADduction: AAROM;Strengthening;Left;10 reps;Supine Straight Leg Raises: AAROM;Strengthening;Left;10 reps;Supine    General Comments General comments (skin integrity, edema, etc.): Pt resting in bed upon PT arrival.  Nursing cleared pt for participation in physical therapy.  Pt agreeable to PT session.      Pertinent Vitals/Pain Pain Assessment: No/denies pain Pain Intervention(s): Limited activity within patient's tolerance;Monitored during session  O2 90% or greater on 2 L O2 via nasal cannula during session (92% end of session). Pt's HR fluctuating between 104-123 bpm  during session.    Home Living                      Prior Function            PT Goals (current goals can now be found in the care plan section) Acute Rehab PT Goals Patient Stated Goal: get better and go home PT Goal Formulation: With patient Time For Goal Achievement: 05/26/17 Potential to Achieve Goals: Fair Additional Goals Additional Goal #1: Assess OOB mobility once temporary fem dialysis cath is removed and update goals as appropriate Progress towards PT goals: Progressing toward goals    Frequency    7X/week      PT Plan Current plan remains appropriate    Co-evaluation              AM-PAC PT "6 Clicks" Daily Activity  Outcome Measure  Difficulty turning over in bed (including adjusting bedclothes, sheets and blankets)?: Total Difficulty moving from lying on back to sitting on the side of the bed? : Total Difficulty sitting down on and standing up from a chair with arms (e.g., wheelchair, bedside commode, etc,.)?: Total Help needed moving to and from a bed to chair (including a wheelchair)?: A Lot Help needed walking in hospital room?: Total Help needed climbing 3-5 steps with a railing? : Total 6 Click Score: 7    End of Session Equipment Utilized During Treatment: Oxygen (2 L O2 via nasal cannula) Activity Tolerance: Patient limited by fatigue Patient left: in bed;with call bell/phone within reach;with bed alarm set;with SCD's reapplied (B heels elevated via foam heel pillows) Nurse Communication: Mobility status;Precautions;Weight bearing status PT Visit Diagnosis: Muscle weakness (generalized) (M62.81);Other abnormalities of gait and mobility (R26.89);History of falling (Z91.81);Pain Pain - Right/Left: Left Pain - part of body: Hip     Time: [id number] PT Time Calculation (min) (ACUTE ONLY): 17 min  Charges:  $Therapeutic Exercise: 8-22 mins                    G CodesHendricks Weaver:       Cindy Weaver, PT 05/03/2017, 9:47  AM [phone]

## 2017-05-14 NOTE — Progress Notes (Addendum)
ANTICOAGULATION CONSULT NOTE - Initial Consult  Pharmacy Consult for warfarin Indication: VTE prophylaxis and warfarin  Allergies  Allergen Reactions  . Penicillins Itching and Rash    Other reaction(s): RASH  . Enalapril Other (See Comments)    cough  . Statins Other (See Comments)    Other reaction(s): MUSCLE PAIN    Patient Measurements: Height: 5' 8.5" (174 cm) Weight: 175 lb 6.4 oz (79.6 kg) IBW/kg (Calculated) : 65.05  Vital Signs:    Labs:  Recent Labs  05/12/17 0441 05/12/17 1747 05/13/17 0252 05/01/2017 0400  HGB 10.1*  --  9.8* 9.1*  HCT 30.3*  --  29.7* 27.5*  PLT 167  --  186 190  LABPROT 18.7*  --  34.7* 47.2*  INR 1.55  --  3.35 4.92*  HEPARINUNFRC  --  0.10* 0.39  --   CREATININE 2.59*  --  3.20* 3.68*    Estimated Creatinine Clearance: 11.8 mL/min (A) (by C-G formula based on SCr of 3.68 mg/dL (H)).   Medical History: Past Medical History:  Diagnosis Date  . A-fib (HCC)    a. on Coumadin; b. CHADS2VASc => 5 (CHF, age x 2, vascular disease, female)  . Chronic systolic CHF (congestive heart failure) (HCC)   . Coronary artery disease    a. s/p CABG w/ SVG-PRDA  . S/P mitral valve clip implantation   . Status post aortic valve replacement     Assessment: Patient was taking warfarin PTA which is now being resumed post-op. Patient underwent surgery for femoral neck fracture today.   Warfarin was held for 9 days and patient has received phytonadione 9 days. Patient was bridged with heparin drip prior to surgery.   Home dose = warfarin 2.5 mg PO daily- pt also takes vit K 200mcg daily at home  Patient was started on amiodarone since admission- drug interaction with warfarin   7/27:        1.38; 5 mg  7/28:       1.55; 5 mg 7/29:      3.35  held 7/30   4.92   Goal of Therapy:  INR 2-3 Monitor platelets by anticoagulation protocol: Yes   Plan:  INR supratherapeutic after 2 days of warfarin 5mg . Most likely due to drug interaction with  amiodarone, which was started during current hospital stay. Due to pt hx of GI bleed w/ INR >4, I will give 3mg  of vit K today and hold warfarin. CBC currently stable. When INR is therapeutic, will need to resume warfarin at about 50% of home dose. Resume pt home dose of Vit K 200 mcg daily.  Recheck INR and CBC in the AM  Jordi Lacko D Idania Desouza, Pharm.D, BCPS Clinical Pharmacist  04/25/2017,8:11 AM

## 2017-05-14 NOTE — Consult Note (Signed)
PULMONARY / CRITICAL CARE MEDICINE   Name: Cindy Weaver MRN: [id number] DOB: 29-Jul-1926    ADMISSION DATE:  05/08/2017   CONSULTATION DATE:  04/17/2017  REFERRING MD:  Dr. Anne Hahn  REASON: Afib with RVR and hypotension  CHIEF COMPLAINT:  Dyspnea  HISTORY OF PRESENT ILLNESS:   This is an 83 y/o female who was transferred to the ICU from the OR for hypoxia following a left hip hemiarthoplasty. Her CXR showed pulmonary edema and bilateral pleural effusions. Patient was treated in the ICU for 2 days and transferred out on 04/30/2017. She had a right thoracentesis today with 850 mL of fluid taken out. She was on Lasix and was followed closely by nephrology for low urine output. This evening at about 11:30 PM, patient went into A. fib with RVR and became hypotensive. She is on by mouth amiodarone. She was on Coumadin but it was discontinued secondary to a supratherapeutic INR. Patient has baseline CKD stage III but her labs showed worsening kidney functions. Her last dialysis session was on July 26. She is being followed by nephrology and scheduled for hemodialysis as needed. She denies chest pain, palpitations, nausea, vomiting, diarrhea, and dizziness, but reports generalized weakness and left hip pain Labs in the ICU show hypokalemia with a potassium level of 6.1, creatinine of 4.02, up from 3.68, INR of 3.81 and a WBC of 19.6. Her urine output remains poor   PAST MEDICAL HISTORY :  She  has a past medical history of A-fib (HCC); Chronic systolic CHF (congestive heart failure) (HCC); Coronary artery disease; S/P mitral valve clip implantation; and Status post aortic valve replacement.  PAST SURGICAL HISTORY: She  has a past surgical history that includes Coronary artery bypass graft; Aortic valve replacement; Mitral valve repair; and Hip Arthroplasty (Left, 06/05/2017).  Allergies  Allergen Reactions  . Penicillins Itching and Rash    Other reaction(s): RASH  . Enalapril Other (See Comments)     cough  . Statins Other (See Comments)    Other reaction(s): MUSCLE PAIN    No current facility-administered medications on file prior to encounter.    Current Outpatient Prescriptions on File Prior to Encounter  Medication Sig  . furosemide (LASIX) 40 MG tablet Take 80 mg by mouth daily.   . Multiple Vitamins-Minerals (MULTIVITAMIN WITH MINERALS) tablet Take 1 tablet by mouth daily.   . potassium chloride (K-DUR) 10 MEQ tablet Take 10 mEq by mouth daily.     FAMILY HISTORY:  Her indicated that the status of her mother is unknown.    SOCIAL HISTORY: She  reports that she quit smoking about 23 years ago. She has a 25.00 pack-year smoking history. She has quit using smokeless tobacco. She reports that she does not drink alcohol or use drugs.  REVIEW OF SYSTEMS:   Constitutional: Negative for fever and chills.  HENT: Negative for congestion and rhinorrhea.  Eyes: Negative for redness and visual disturbance.  Respiratory: Negative for shortness of breath and wheezing.  Cardiovascular: Negative for chest pain and palpitations.  Gastrointestinal: Negative  for nausea , vomiting and abdominal pain and  Loose stools Genitourinary: Negative for dysuria and urgency.  Endocrine: Denies polyuria, polyphagia and heat intolerance Musculoskeletal: Positive for left hip pain.  Skin: Negative for pallor and wound.  Neurological: Negative for dizziness and headaches but positive for generalized weakness  SUBJECTIVE:    VITAL SIGNS: BP (!) 80/61   Pulse (!) 149   Temp 98.7 F (37.1 C) (Oral)   Resp 19  Ht 5' 8.5" (1.74 m)   Wt 175 lb 6.4 oz (79.6 kg)   SpO2 93%   BMI 26.28 kg/m   HEMODYNAMICS:    VENTILATOR SETTINGS:    INTAKE / OUTPUT: I/O last 3 completed shifts: In: 748 [P.O.:720; I.V.:28] Out: 375 [Urine:375]  PHYSICAL EXAMINATION: General: chronically ill looking Neuro:  AAOX 3, CN intact, no deficits HEENT:  PERRLA, no JVD, trachea midline Cardiovascular:  AP  tachycardic, irregular, S1/S2, no MRG, +2 pulses bilaterally Lungs: Normal WOB, bilateral breath sounds, no wheezing or rhonchi  Abdomen:  Non-distended, normal bowel sounds, no organomegaly Musculoskeletal:  Left leg immobilized, left hip incision intact Skin:  Warm and dry  LABS:  BMET  Recent Labs Lab 05/12/17 0441 05/13/17 0252 2016-12-22 0400  NA 139 137 137  K 4.5 4.5 4.8  CL 103 102 102  CO2 26 26 25   BUN 42* 58* 74*  CREATININE 2.59* 3.20* 3.68*  GLUCOSE 77 104* 98    Electrolytes  Recent Labs Lab 05/10/17 0306  05/12/17 0441 05/13/17 0252 2016-12-22 0400  CALCIUM 8.0*  < > 8.2* 8.2* 8.2*  MG  --   --  2.0  --   --   PHOS 2.2*  --  3.5  --   --   < > = values in this interval not displayed.  CBC  Recent Labs Lab 05/12/17 0441 05/13/17 0252 2016-12-22 0400  WBC 16.0* 19.3* 20.4*  HGB 10.1* 9.8* 9.1*  HCT 30.3* 29.7* 27.5*  PLT 167 186 190    Coag's  Recent Labs Lab 05/09/17 0815  04/15/2017 0210  05/13/17 0252 2016-12-22 0400 2016-12-22 1706  APTT 41*  --  31.1*  --   --   --   --   INR  --   < > 1.38  < > 3.35 4.92* 4.41*  < > = values in this interval not displayed.  Sepsis Markers  Recent Labs Lab 05/08/17 0552  PROCALCITON 1.67    ABG No results for input(s): PHART, PCO2ART, PO2ART in the last 168 hours.  Liver Enzymes  Recent Labs Lab 05/09/17 0655 05/10/17 0306  AST 23  --   ALT 15  --   ALKPHOS 73  --   BILITOT 0.7  --   ALBUMIN 2.3* 2.1*    Cardiac Enzymes No results for input(s): TROPONINI, PROBNP in the last 168 hours.  Glucose  Recent Labs Lab 04/17/2017 1638 05/12/17 1614  GLUCAP 83 117*    Imaging Dg Chest 1 View  Result Date: 07/13/2017 CLINICAL DATA:  Right pleural effusion. EXAM: CHEST 1 VIEW COMPARISON:  Radiograph of May 13, 2017. FINDINGS: Stable cardiomegaly. Atherosclerosis of thoracic aorta is noted. Stable mild diffuse interstitial densities are noted throughout both lungs most consistent with  scarring, although superimposed edema or inflammation cannot be excluded. No pneumothorax is noted. Minimal bilateral pleural effusions are noted. Sternotomy wires are noted. IMPRESSION: Aortic atherosclerosis. Stable mild diffuse interstitial densities throughout both lungs most consistent with scarring, although superimposed edema or inflammation cannot be excluded. Minimal bilateral pleural effusions. Electronically Signed   By: Lupita RaiderJames  Green Jr, M.D.   On: 009/28/2018 12:14   Dg Chest Port 1 View  Result Date: 07/13/2017 CLINICAL DATA:  Left hip hemiarthroplasty. Postoperative day 3. Prior thoracentesis. EXAM: PORTABLE CHEST 1 VIEW COMPARISON:  Ultrasound 009/28/2018. Chest x-ray 009/28/2018, 05/13/2017. FINDINGS: Prior CABG. Cardiomegaly with slight increase pulmonary interstitial prominence and bilateral pleural effusions. Findings suggest congestive heart failure. No pneumothorax IMPRESSION: 1. Prior CABG. Cardiomegaly  with bilateral increased pulmonary interstitial prominence and bilateral pleural effusions most consistent with CHF. 2. No pneumothorax . Electronically Signed   By: Maisie Fushomas  Register   On: 04/23/2017 14:41   [place] Thoracentesis Asp Pleural Space W/img Guide  Result Date: 04/25/2017 INDICATION: Pleural effusions, larger on the right EXAM: ULTRASOUND GUIDED RIGHT THORACENTESIS MEDICATIONS: 1% lidocaine local COMPLICATIONS: None immediate. PROCEDURE: An ultrasound guided thoracentesis was thoroughly discussed with the patient and questions answered. The benefits, risks, alternatives and complications were also discussed. The patient understands and wishes to proceed with the procedure. Written consent was obtained. Ultrasound was performed to localize and mark an adequate pocket of fluid in the right chest. The area was then prepped and draped in the normal sterile fashion. 1% Lidocaine was used for local anesthesia. Under ultrasound guidance a 6 Fr Safe-T-Centesis catheter was introduced.  Thoracentesis was performed. The catheter was removed and a dressing applied. FINDINGS: A total of approximately 850 cc of clear serosanguineous fluid was removed. Samples were sent to the laboratory as requested by the clinical team. IMPRESSION: Successful ultrasound guided right thoracentesis yielding 850 cc of pleural fluid. Electronically Signed   By: Judie PetitM.  Shick M.D.   On: 04/17/2017 11:33     STUDIES:  Last echo done on 05/01/2017 showed a LV EF: 35% -   40%  CULTURES: None  ANTIBIOTICS: Azithromycin-completed Clindamycin-completed   SIGNIFICANT EVENTS: 04/27/2017>> [facility] Tele[facility] s/p Fall and Left hip fracture 05/04/17>> PCCM consulted for Acute Hypoxic Respiratory Failure 07/27>Left hemiarthroplasty 07/30: right thoracentesis-850 cc out 07/30>Bounced back to ICU for Afib with RVR and hypotension  LINES/TUBES: PIVs  DISCUSSION: 83 y/o female with a h/o afib, CAD, CKD and CHF presenting with acute hypoxic respiratory failure s/p left hemiarthroplasty, afib with RVR and hypotension  ASSESSMENT Afib with RVR Hypotension/shock Hypokalemia Acute hypoxic respiratory failure Bilateral pleural effusions, status post right thoracentesis Pulmonary edema-improved Acute on chronic renal failure-decreased urine output Femoral neck fracture s/p Left hip hemiarthroplasty H/O Aortic valve replacement H/o CHF H/O Anemia  PLAN Hemodynamics per ICU D50 with 10 units of insulin IV 1 Supplemental O2 via [place] to keep SPO2>90% Hold off on IV diuretics. Gentle IV hydration with normal saline 2 L over 2 hours We will reconsult nephrology  HD per nephrology CXR and EKG daily prn Trend cardiac enzymes Amiodarone bolus and infusion, hold for heart rate less than 50 bpm Hold by mouth amiodarone Monitor I/O Flutter valve IS  Pain management Monitor and correct electrolyte imbalances  . Further changes in treatment plan pending clinical course and diagnostics  FAMILY   - Updates: Family and patient updated at bedside  - Inter-disciplinary family meet or Palliative Care meeting due by:  day 7    Magdalene S. [facility] ANP-BC Pulmonary and Critical Care Medicine [facility] Pager [phone] or [phone]  05/13/2017, 11:33 PM   Billy Fischeravid Fathima Bartl, MD PCCM service Mobile [phone] Pager [phone] 05/15/2017 12:10 PM

## 2017-05-14 NOTE — [place] FL2 (Signed)
[place] MEDICAID FL2 LEVEL OF CARE SCREENING TOOL     IDENTIFICATION  Patient Name: Cindy Weaver Birthdate: 08/16/1926 Sex: female Admission Date (Current Location): 04/29/2017  [place]   Facility and Address:  [facility], [address], [place], [place] [address]      Provider Number: [id number]  Attending Physician Name and Address:  Houston SirenSainani, Vivek J, MD  Relative Name and Phone Number:       Current Level of Care: Hospital Recommended Level of Care: Skilled Nursing Facility Prior Approval Number:    Date Approved/Denied:   PASRR Number:  [id number])  Discharge Plan: SNF    Current Diagnoses: Patient Active Problem List   Diagnosis Date Noted  . Pressure injury of skin 05/13/2017  . Cough   . Non-intractable vomiting with nausea   . Acute renal failure (ARF) (HCC)   . Goals of care, counseling/discussion   . Palliative care encounter   . Malnutrition of moderate degree 05/04/2017  . Atrial fibrillation with RVR (HCC) 04/26/2017  . Hip fracture (HCC) 05/15/2017  . Acute on chronic systolic CHF (congestive heart failure) (HCC) 05/04/2017  . Acute on chronic respiratory failure with hypoxia (HCC) 04/20/2017  . Status post aortic valve replacement 04/15/2017  . Status post mitral valve repair 04/17/2017  . AKI (acute kidney injury) (HCC) 04/16/2017  . History of Coumadin therapy 05/01/2017  . Elevated troponin 04/27/2017  . Hypotension 04/16/2017  . A-fib (HCC) 06/17/2015  . Dental disease 06/17/2015  . MI (mitral incompetence) 04/25/2015  . Bursitis of knee 10/19/2014  . Anemia 02/18/2014  . Positional vertigo 12/15/2013  . CCF (congestive cardiac failure) (HCC) 05/22/2008    Orientation RESPIRATION BLADDER Height & Weight     Self, Time, Situation, Place  O2 (3 Liters Oxygen ) Continent Weight: 175 lb 6.4 oz (79.6 kg) Height:  5' 8.5" (174 cm)  BEHAVIORAL SYMPTOMS/MOOD NEUROLOGICAL BOWEL  NUTRITION STATUS   (none)  (none) Continent Diet (NPO to be advanced)  AMBULATORY STATUS COMMUNICATION OF NEEDS Skin   Extensive Assist Verbally Surgical wounds, Wound Vac (Incision: Left Hip. )                       Personal Care Assistance Level of Assistance  Bathing, Feeding, Dressing Bathing Assistance: Limited assistance Feeding assistance: Independent Dressing Assistance: Limited assistance     Functional Limitations Info  Sight, Hearing, Speech Sight Info: Adequate Hearing Info: Adequate Speech Info: Adequate    SPECIAL CARE FACTORS FREQUENCY  PT (By licensed PT), OT (By licensed OT)     PT Frequency:  (5) OT Frequency:  (5)            Contractures      Additional Factors Info  Code Status, Allergies  Dialysis  Code Status Info:  (full code) Allergies Info:  (Penicillins, Enalapril, Statins)           Current Medications (04/27/2017):  This is the current hospital active medication list Current Facility-Administered Medications  Medication Dose Route Frequency Provider Last Rate Last Dose  . acetaminophen (TYLENOL) tablet 650 mg  650 mg Oral Q6H PRN Hooten, Illene LabradorJames P, MD       Or  . acetaminophen (TYLENOL) suppository 650 mg  650 mg Rectal Q6H PRN Hooten, Illene LabradorJames P, MD      . albuterol (PROVENTIL) (2.5 MG/3ML) 0.083% nebulizer solution 2.5 mg  2.5 mg Nebulization TID Kolluru, Sarath, MD   2.5 mg at  05/10/2017 0742  . amiodarone (PACERONE) tablet 200 mg  200 mg Oral BID Sondra BargesDunn, Ryan M, PA-C   Stopped at 05/13/2017 [id number]  . bisacodyl (DULCOLAX) suppository 10 mg  10 mg Rectal Daily PRN Hooten, Illene LabradorJames P, MD      . budesonide (PULMICORT) nebulizer solution 0.5 mg  0.5 mg Nebulization BID Erin FullingKasa, Kurian, MD   0.5 mg at 05/04/2017 0742  . chlorhexidine (PERIDEX) 0.12 % solution 15 mL  15 mL Mouth Rinse BID Shane Crutchamachandran, Pradeep, MD   15 mL at 05/13/17 0945  . feeding supplement (ENSURE ENLIVE) (ENSURE ENLIVE) liquid 237 mL  237 mL Oral TID BM Enid BaasKalisetti, Radhika, MD   237 mL  at 05/13/17 2000  . ferrous sulfate tablet 325 mg  325 mg Oral Q breakfast Arnaldo Nataliamond, Michael S, MD   325 mg at 05/13/17 [phone]  . fluticasone (FLONASE) 50 MCG/ACT nasal spray 1 spray  1 spray Each Nare Daily Arnaldo Nataliamond, Michael S, MD   1 spray at 05/13/17 0945  . guaiFENesin (ROBITUSSIN) 100 MG/5ML solution 400 mg  20 mL Oral Q4H PRN Houston SirenSainani, Vivek J, MD      . magnesium hydroxide (MILK OF MAGNESIA) suspension 30 mL  30 mL Oral Daily PRN Hooten, Illene LabradorJames P, MD      . MEDLINE mouth rinse  15 mL Mouth Rinse q12n4p Shane Crutchamachandran, Pradeep, MD   15 mL at 05/13/17 1200  . menthol-cetylpyridinium (CEPACOL) lozenge 3 mg  1 lozenge Oral PRN Hooten, Illene LabradorJames P, MD       Or  . phenol (CHLORASEPTIC) mouth spray 1 spray  1 spray Mouth/Throat PRN Hooten, Illene LabradorJames P, MD      . metoCLOPramide (REGLAN) tablet 5-10 mg  5-10 mg Oral Q8H PRN Hooten, Illene LabradorJames P, MD       Or  . metoCLOPramide (REGLAN) injection 5-10 mg  5-10 mg Intravenous Q8H PRN Hooten, Illene LabradorJames P, MD      . metoprolol tartrate (LOPRESSOR) tablet 12.5 mg  12.5 mg Oral BID Shane Crutchamachandran, Pradeep, MD   12.5 mg at 05/13/17 2123  . morphine 2 MG/ML injection 2 mg  2 mg Intravenous Q4H PRN Hooten, Illene LabradorJames P, MD      . multivitamin with minerals tablet 1 tablet  1 tablet Oral Daily Arnaldo Nataliamond, Michael S, MD   Stopped at 04/22/2017 [phone]  . ondansetron (ZOFRAN) tablet 4 mg  4 mg Oral Q6H PRN Hooten, Illene LabradorJames P, MD       Or  . ondansetron (ZOFRAN) injection 4 mg  4 mg Intravenous Q6H PRN Hooten, Illene LabradorJames P, MD      . oxyCODONE (Oxy IR/ROXICODONE) immediate release tablet 5-10 mg  5-10 mg Oral Q4H PRN Hooten, Illene LabradorJames P, MD      . pantoprazole (PROTONIX) EC tablet 40 mg  40 mg Oral Daily Arnaldo Nataliamond, Michael S, MD   40 mg at 05/13/17 0942  . [START ON 05/15/2017] phytonadione (VITAMIN K) oral solution 1 mg/0.5 mL  0.2 mg Oral Daily Olene FlossMaccia, Melissa D, RPH      . phytonadione (VITAMIN K) oral solution 1 mg/0.5 mL  3 mg Oral Once Olene FlossMaccia, Melissa D, RPH   Stopped at 05/01/[phone]  . pneumococcal 23  valent vaccine (PNU-IMMUNE) injection 0.5 mL  0.5 mL Intramuscular Prior to discharge Enid BaasKalisetti, Radhika, MD      . polyethylene glycol (MIRALAX / GLYCOLAX) packet 17 g  17 g Oral Daily Altamese DillingVachhani, Vaibhavkumar, MD   Stopped at 04/29/2017 [phone]  . predniSONE (DELTASONE) tablet 20 mg  20 mg  Oral Q breakfast Erin Fulling, MD   20 mg at 04/15/2017 0825  . senna-docusate (Senokot-S) tablet 1 tablet  1 tablet Oral BID Donato Heinz, MD   Stopped at 05/06/2017 [phone]  . sodium phosphate (FLEET) 7-19 GM/118ML enema 1 enema  1 enema Rectal Once PRN Hooten, Illene Labrador, MD      . tiotropium (SPIRIVA) inhalation capsule 18 mcg  18 mcg Inhalation Daily Erin Fulling, MD   18 mcg at 05/13/17 9604  . traMADol (ULTRAM) tablet 50-100 mg  50-100 mg Oral Q4H PRN Hooten, Illene Labrador, MD      . Warfarin - Pharmacist Dosing Inpatient   Does not apply q1800 Cindi Carbon, [facility]   Stopped at 05/13/17 1800     Discharge Medications: Please see discharge summary for a list of discharge medications.  Relevant Imaging Results:  Relevant Lab Results:   Additional Information  SSN: [id number]  [place] Chelf, Darleen Crocker, LCSW

## 2017-05-14 NOTE — Clinical Social Work Note (Signed)
Clinical Social Work Assessment  Patient Details  Name: Cindy Weaver MRN: [id number] Date of Birth: Jan 31, 1926  Date of referral:  04/25/2017               Reason for consult:  Facility Placement                Permission sought to share information with:  Chartered certified accountant granted to share information::  Yes, Verbal Permission Granted  Name::      [place]::   [place]   Relationship::     Contact Information:     Housing/Transportation Living arrangements for the past 2 months:  [place] of Information:  Adult Children Patient Interpreter Needed:  None Criminal Activity/Legal Involvement Pertinent to Current Situation/Hospitalization:  No - Comment as needed Significant Relationships:  Adult Children Lives with:  Friends Do you feel safe going back to the place where you live?    Need for family participation in patient care:  Yes (Comment)  Care giving concerns:  Patient lives in [place] with her friend/ caregiver Cindy Weaver.    Social Worker assessment / plan:  Holiday representative (CSW) received SNF consult. PT is recommending SNF. Patient is currently on acute dialysis and it is undetermined if patient will need to be on dialysis long term. CSW met with patient's daughter Cindy Weaver [phone] at bedside. Patient was off the floor for at that time. CSW introduced self and explained role of CSW department. Daughter reported that she is patient's HPOA and lives in [place] near the [place] area. Cindy Weaver reported that patient has another daughter Cindy Weaver that lives in [place] also near the [place] area. CSW explained SNF process to daughter and she is agreeable to SNF search in [place]. Daughter prefers [facility]. FL2 complete and faxed out. CSW will continue to follow and assist as needed.   Employment status:  Retired, Disabled (Comment on whether or not currently receiving Disability) Insurance  information:  Medicare PT Recommendations:  [place] / Referral to community resources:  [place]  Patient/Family's Response to care:  Patient's daughter Cindy Weaver is agreeable to SNF search in [place].   Patient/Family's Understanding of and Emotional Response to Diagnosis, Current Treatment, and Prognosis:  Patient's daughter Cindy Weaver was very pleasant and thanked CSW for assistance.   Emotional Assessment Appearance:  Appears stated age Attitude/Demeanor/Rapport:  Unable to Assess Affect (typically observed):  Unable to Assess Orientation:  Oriented to Self, Oriented to Place, Oriented to  Time, Oriented to Situation Alcohol / Substance use:  Not Applicable Psych involvement (Current and /or in the community):  No (Comment)  Discharge Needs  Concerns to be addressed:  Discharge Planning Concerns Readmission within the last 30 days:  No Current discharge risk:  Dependent with Mobility Barriers to Discharge:  Continued Medical Work up   [employer], Veronia Beets, LCSW 04/28/2017, 12:26 PM

## 2017-05-14 NOTE — Progress Notes (Signed)
Clinical Social Worker (CSW) met with patient and presented SNF bed offers. Patient chose [facility]. Patient's daughter Sharyn Lull is in agreement with [facility]. [facility] liaison is aware of accepted bed offer.   [employer], LCSW [phone]

## 2017-05-14 NOTE — Care Management Note (Signed)
Case Management Note  Patient Details  Name: Cindy Weaver MRN: [id number] Date of Birth: 02/16/1926  Subjective/Objective:  Notified Amanda with patient pathways of planned perm cath today                  Action/Plan:   Expected Discharge Date:                  Expected Discharge Plan:  Skilled Nursing Facility  In-House Referral:  Clinical Social Work  Discharge planning Services  CM Consult  Post Acute Care Choice:    Choice offered to:     DME Arranged:    DME Agency:     HH Arranged:    HH Agency:     Status of Service:  In process, will continue to follow  If discussed at Long Length of Stay Meetings, dates discussed:    Additional Comments:  Marily MemosLisa M Jhayden Demuro, RN 04/27/2017, 10:41 AM

## 2017-05-14 NOTE — Progress Notes (Signed)
Nephrology had requested a PermCath be placed, the patient's INR was markedly elevated and this would not be safe today. Continues to make urine. We will allow her INR to drift down and if her renal function does not improve a PermCath can be placed later in the week.

## 2017-05-14 NOTE — Progress Notes (Signed)
SLP Cancellation Note  Patient Details Name: Cindy Weaver MRN: [id number] DOB: 12/19/1925   Cancelled treatment:       Reason Eval/Treat Not Completed: Patient at procedure or test/unavailable (chart reviewed; NSG consulted).  Due to pt NPO for 2 procedures today, will hold on any dysphagia tx. Suspect pt may need an objective assessment of her swallowing d/t the degree of dysphagia noted during BSE and tx sessions. NSG/CM updated. ST services will f/u tomorrow.    Jerilynn SomKatherine Ronel Rodeheaver, MS, CCC-SLP Ellerie Arenz 04/26/2017, 2:18 PM

## 2017-05-14 NOTE — Progress Notes (Signed)
Nutrition Follow-up  DOCUMENTATION CODES:   Non-severe (moderate) malnutrition in context of chronic illness  INTERVENTION:  1. Continue Ensure Enlive po TID, each supplement provides 350 kcal and 20 grams of protein 2. Recommend GOC vs Need for Nutrition Support, patient is consuming very little PO 3. Continue MVI w/ Minerals  NUTRITION DIAGNOSIS:   Malnutrition related to  (heart disease and advanced age ) as evidenced by severe depletion of muscle mass, percent weight loss. -ongoing  GOAL:   Patient will meet greater than or equal to 90% of their needs -not meeting  MONITOR:   PO intake, Supplement acceptance, Labs, Weight trends  REASON FOR ASSESSMENT:   Consult Assessment of nutrition requirement/status  ASSESSMENT:   83 year old female with multiple medical problems including A. fib on Coumadin, CAD status post CABG, mitral valve clipping for severe MR, bioprosthetic aortic valve replacement surgery, chronic respiratory failure on 2 L home oxygen, Systolic CHF with EF of 45% presents to the hospital secondary to fall and noted to have left hip fracture.  Patient continues to eat poorly. Consumed an entire Ensure yesterday per RN but otherwise patient eats very little solid food. If unable to discharge soon she may need PMT consult vs feeding tube. Thoracentesis today removed 850cc 10L Fluid positive. Weight up at least 29 lbs since admission. Access: Temp R Fem Cath, L PIV x2  Intake/Output Summary (Last 24 hours) at 05/10/2017 1547 Last data filed at 05/12/2017 [id number]  Gross per 24 hour  Intake              120 ml  Output              250 ml  Net             -130 ml   Meal Completion: 10-20% Medications reviewed and include:  Iron, Prednisone, Miralax, Senokot-S Labs reviewed:  BUN/Creatinine 74/3.68   Diet Order:  Diet NPO time specified  Skin:  Wound (see comment) (Deep tissue injury to heel)  Last BM:  05/08/2017 (Type 2)  Height:   Ht Readings from  Last 1 Encounters:  05/09/17 5' 8.5" (1.74 m)    Weight:   Wt Readings from Last 1 Encounters:  04/29/2017 175 lb 6.4 oz (79.6 kg)    Ideal Body Weight:  64.7 kg  BMI:  Body mass index is 26.28 kg/m.  Estimated Nutritional Needs:   Kcal:  1600-1800kcal/day   Protein:  73-84g/day   Fluid:  >1.6L/day   EDUCATION NEEDS:   Education needs addressed  Dionne AnoWilliam M. Fanchon Papania, MS, RD LDN Inpatient Clinical Dietitian Pager [phone]

## 2017-05-14 NOTE — Progress Notes (Signed)
[employer] [facility] - [place] [facility]   PATIENT NAME: Cindy FoleyLiller Weaver    MR#:  [id number]  DATE OF BIRTH:  03/13/1926  SUBJECTIVE:   Status post left hip hemiarthroplasty, postop day #3. today.  Transfer out of the ICU yesterday. Status post ultrasound-guided thoracentesis with 850 cc of fluid removed today. Still having some shortness of breath, no plan for hemodialysis today and creatinine remains elevated.  REVIEW OF SYSTEMS:    Review of Systems  Constitutional: Negative for chills and fever.  HENT: Negative for congestion and tinnitus.   Eyes: Negative for blurred vision and double vision.  Respiratory: Negative for cough, shortness of breath and wheezing.   Cardiovascular: Negative for chest pain, orthopnea and PND.  Gastrointestinal: Negative for abdominal pain, diarrhea, nausea and vomiting.  Genitourinary: Negative for dysuria and hematuria.  Musculoskeletal: Positive for joint pain (Left Hip).  Neurological: Negative for dizziness, sensory change and focal weakness.  All other systems reviewed and are negative.   Nutrition: Dysphagia II with nectar thick liquids Tolerating Diet: Yes Tolerating PT: Await Eval.   DRUG ALLERGIES:   Allergies  Allergen Reactions  . Penicillins Itching and Rash    Other reaction(s): RASH  . Enalapril Other (See Comments)    cough  . Statins Other (See Comments)    Other reaction(s): MUSCLE PAIN    VITALS:  Blood pressure (!) 88/53, pulse (!) 102, temperature 97.8 F (36.6 C), temperature source Oral, resp. rate (!) 22, height 5' 8.5" (1.74 m), weight 79.6 kg (175 lb 6.4 oz), SpO2 93 %.  PHYSICAL EXAMINATION:   Physical Exam  GENERAL:  83 y.o.-year-old patient lying in bed in mild Resp. Distress.  EYES: Pupils equal, round, reactive to light and accommodation. No scleral icterus. Extraocular muscles intact.  HEENT: Head atraumatic, normocephalic. Oropharynx and nasopharynx clear.  NECK:  Supple, no jugular venous  distention. No thyroid enlargement, no tenderness.  LUNGS: Normal breath sounds bilaterally, no wheezing, rales on right mid to lower lung fields, No rhonchi. No use of accessory muscles of respiration.  CARDIOVASCULAR: S1, S2 Irregular. No murmurs, rubs, or gallops.  ABDOMEN: Soft, nontender, nondistended. Bowel sounds present. No organomegaly or mass.  EXTREMITIES: No cyanosis, clubbing or edema b/l. Left Hip dressing w/ wound vac in place.     NEUROLOGIC: Cranial nerves II through XII are intact. No focal Motor or sensory deficits b/l.  Globally weak.  PSYCHIATRIC: The patient is alert and oriented x 3.  SKIN: No obvious rash, lesion, or ulcer.    LABORATORY PANEL:   CBC  Recent Labs Lab 05/10/2017 0400  WBC 20.4*  HGB 9.1*  HCT 27.5*  PLT 190   ------------------------------------------------------------------------------------------------------------------  Chemistries   Recent Labs Lab 05/09/17 0655  05/12/17 0441  04/22/2017 0400  NA 134*  < > 139  < > 137  K 4.7  < > 4.5  < > 4.8  CL 95*  < > 103  < > 102  CO2 26  < > 26  < > 25  GLUCOSE 94  < > 77  < > 98  BUN 73*  < > 42*  < > 74*  CREATININE 3.07*  < > 2.59*  < > 3.68*  CALCIUM 8.3*  < > 8.2*  < > 8.2*  MG  --   --  2.0  --   --   AST 23  --   --   --   --   ALT 15  --   --   --   --  ALKPHOS 73  --   --   --   --   BILITOT 0.7  --   --   --   --   < > = values in this interval not displayed. ------------------------------------------------------------------------------------------------------------------  Cardiac Enzymes No results for input(s): TROPONINI in the last 168 hours. ------------------------------------------------------------------------------------------------------------------  RADIOLOGY:  Dg Chest 1 View  Result Date: 04/22/2017 CLINICAL DATA:  Right pleural effusion. EXAM: CHEST 1 VIEW COMPARISON:  Radiograph of May 13, 2017. FINDINGS: Stable cardiomegaly. Atherosclerosis of thoracic  aorta is noted. Stable mild diffuse interstitial densities are noted throughout both lungs most consistent with scarring, although superimposed edema or inflammation cannot be excluded. No pneumothorax is noted. Minimal bilateral pleural effusions are noted. Sternotomy wires are noted. IMPRESSION: Aortic atherosclerosis. Stable mild diffuse interstitial densities throughout both lungs most consistent with scarring, although superimposed edema or inflammation cannot be excluded. Minimal bilateral pleural effusions. Electronically Signed   By: Lupita RaiderJames  Green Jr, M.D.   On: 04/15/2017 12:14   Dg Chest 1 View  Result Date: 05/13/2017 CLINICAL DATA:  Shortness of Breath EXAM: CHEST 1 VIEW COMPARISON:  05/12/2017 FINDINGS: Prior CABG. Cardiomegaly. Diffuse interstitial prominence throughout the lungs compatible with interstitial edema. Bilateral pleural effusions, right greater than left. Bibasilar atelectasis. IMPRESSION: Continued interstitial prominence, likely edema. Bilateral effusions, right greater than left with bibasilar atelectasis. Electronically Signed   By: Charlett NoseKevin  Dover M.D.   On: 05/13/2017 07:35   [place] Thoracentesis Asp Pleural Space W/img Guide  Result Date: 05/13/2017 INDICATION: Pleural effusions, larger on the right EXAM: ULTRASOUND GUIDED RIGHT THORACENTESIS MEDICATIONS: 1% lidocaine local COMPLICATIONS: None immediate. PROCEDURE: An ultrasound guided thoracentesis was thoroughly discussed with the patient and questions answered. The benefits, risks, alternatives and complications were also discussed. The patient understands and wishes to proceed with the procedure. Written consent was obtained. Ultrasound was performed to localize and mark an adequate pocket of fluid in the right chest. The area was then prepped and draped in the normal sterile fashion. 1% Lidocaine was used for local anesthesia. Under ultrasound guidance a 6 Fr Safe-T-Centesis catheter was introduced. Thoracentesis was performed.  The catheter was removed and a dressing applied. FINDINGS: A total of approximately 850 cc of clear serosanguineous fluid was removed. Samples were sent to the laboratory as requested by the clinical team. IMPRESSION: Successful ultrasound guided right thoracentesis yielding 850 cc of pleural fluid. Electronically Signed   By: Judie PetitM.  Shick M.D.   On: 04/19/2017 11:33     ASSESSMENT AND PLAN:   83 year old female with past medical history of chronic afibrillation, history of coronary artery disease status post bypass, history of mitral levels placement, chronic respiratory failure, acute systolic CHF who presented to the hospital after a fall and noted to have left hip fracture.  1. Status post fall and left hip fracture-seen by orthopedics and is status post left hip hemiarthroplasty postoperative day #3 today. - await PT eval. And cont. Pain control as per Ortho.  2. Atrial fibrillation with rapid ventricular response-rates are much improved now but she remains in atrial fibrillation. Initially was on amiodarone drip now weaned off of it. Continue oral amiodarone and metoprolol. -Appreciate cardiology input, INR is supratherapeutic today. Hold Coumadin, vitamin K given.  3. Acute kidney injury-baseline creatinine around 1.2-1.3. Suspect this is ATN from hypotension -Did not respond to fluids, therefore started on hemodialysis. Nephrology following, no acute indication for hemodialysis today and will continue to monitor.  Cr. Remains at 3.6 today.  - We'll remove left groin  temporary dialysis catheter today. If patient needs further hemodialysis will place a PermCath in the neck/chest area.  4. Pleural effusion-patient's chest x-ray yesterday morning showed a significant right-sided pleural effusion.  -Status post right-sided ultrasound-guided thoracentesis with 850 cc of fluid removed.  5. COPD-no acute exacerbation-continue albuterol nebulizers, prednisone, Spiriva.  6. GERD-continue  Protonix.  7. Acute blood loss anemia-secondary to recent surgery. -Transfused 1 unit of packed red blood cells and hemoglobin is stable. Continue to follow serial hemoglobins for now.  8. Constipation-continue MiraLAX, Senokot.  Discussed plan of care with pt's daughter at bedside.   All the records are reviewed and case discussed with Care Management/Social Worker. Management plans discussed with the patient, family and they are in agreement.  CODE STATUS: Full  DVT Prophylaxis: Heparin drip/Coumadin  TOTAL TIME TAKING CARE OF THIS PATIENT: 30 minutes.   POSSIBLE D/C IN 1-2 DAYS, DEPENDING ON CLINICAL CONDITION.   Houston Siren M.D on 05/08/2017 at 2:28 PM  Between 7am to 6pm - Pager - [phone]  After 6pm go to [email or web address] - Therapist, nutritional Hospitalists  Office  [phone]  CC: Primary care physician; [facility]

## 2017-05-14 NOTE — Progress Notes (Signed)
Called by RN that pt BP low, and HR a fib, now into 130-150 range.  A fib RVR.  Pt is brittle, has CHF and renal disease, currently on PRN supportive dialysis.  Just had thoracentesis with 850 cc removed today.  Will transfer to stepdown for gentle IV fluids and likely need for pressor support while on amiodarone or other rate controlling agent.  Kristeen MissWILLIS, Cindy Weaver [facility] 04/29/2017, 11:21 PM

## 2017-05-14 NOTE — Progress Notes (Signed)
OT Cancellation Note  Patient Details Name: Cindy Weaver W Maslowski MRN: [id number] DOB: 01/24/1926   Cancelled Treatment:    Reason Eval/Treat Not Completed: Patient at procedure or test/ unavailable. Pt out of room for thoracentesis this morning. MD in room speaking with family members. Perm cath placement was scheduled for today, however on hold at this time due to elevated INR. Nephrology to reassess appropriateness. Will continue to follow and re-attempt OT treatment at appropriate.  Richrd PrimeJamie Stiller, MPH, MS, OTR/L ascom [phone] 07-08-17, 11:44 AM

## 2017-05-14 NOTE — Clinical Social Work Placement (Signed)
   CLINICAL SOCIAL WORK PLACEMENT  NOTE  Date:  04/21/2017  Patient Details  Name: Cindy Weaver MRN: [id number] Date of Birth: 08/06/1926  Clinical Social Work is seeking post-discharge placement for this patient at the Skilled  Nursing Facility level of care (*CSW will initial, date and re-position this form in  chart as items are completed):  Yes   Patient/family provided with [facility] Clinical Social Work Department's list of facilities offering this level of care within the geographic area requested by the patient (or if unable, by the patient's family).  Yes   Patient/family informed of their freedom to choose among providers that offer the needed level of care, that participate in Medicare, Medicaid or managed care program needed by the patient, have an available bed and are willing to accept the patient.  Yes   Patient/family informed of 's ownership interest in [facility] and [facility], as well as of the fact that they are under no obligation to receive care at these facilities.  PASRR submitted to EDS on 04/18/2017     PASRR number received on 04/28/2017     Existing PASRR number confirmed on       FL2 transmitted to all facilities in geographic area requested by pt/family on 04/17/2017     FL2 transmitted to all facilities within larger geographic area on       Patient informed that his/her managed care company has contracts with or will negotiate with certain facilities, including the following:            Patient/family informed of bed offers received.  Patient chooses bed at       Physician recommends and patient chooses bed at      Patient to be transferred to   on  .  Patient to be transferred to facility by       Patient family notified on   of transfer.  Name of family member notified:        PHYSICIAN       Additional Comment:    _______________________________________________ Raveen Wieseler, Darleen CrockerBailey M, LCSW 05/12/2017, 12:26 PM

## 2017-05-14 NOTE — Progress Notes (Signed)
Text page MD about critical lab INR 4.92

## 2017-05-14 NOTE — Procedures (Signed)
Pleural effusions  S/p US RT THORACENTESIS  850[facility] REMOVED LABS SENT CXR PENDNIG FULL REPORT IN PACS

## 2017-05-15 ENCOUNTER — Inpatient Hospital Stay: Payer: Medicare Other

## 2017-05-15 DIAGNOSIS — S72145A Nondisplaced intertrochanteric fracture of left femur, initial encounter for closed fracture: Secondary | ICD-10-CM

## 2017-05-15 DIAGNOSIS — N179 Acute kidney failure, unspecified: Secondary | ICD-10-CM

## 2017-05-15 LAB — BASIC METABOLIC PANEL
ANION GAP: 9 (ref 5–15)
BUN: 76 mg/dL — ABNORMAL HIGH (ref 6–20)
CHLORIDE: 105 mmol/L (ref 101–111)
CO2: 24 mmol/L (ref 22–32)
Calcium: 8 mg/dL — ABNORMAL LOW (ref 8.9–10.3)
Creatinine, Ser: 4.12 mg/dL — ABNORMAL HIGH (ref 0.44–1.00)
GFR calc Af Amer: 10 mL/min — ABNORMAL LOW (ref >60)
GFR calc non Af Amer: 9 mL/min — ABNORMAL LOW (ref >60)
GLUCOSE: 97 mg/dL (ref 65–99)
POTASSIUM: 5.3 mmol/L — AB (ref 3.5–5.1)
Sodium: 138 mmol/L (ref 135–145)

## 2017-05-15 LAB — PROTIME-INR
INR: 3.81
INR: 3.17
Prothrombin Time: 38.5 seconds — ABNORMAL HIGH (ref 11.4–15.2)
Prothrombin Time: 33.2 seconds — ABNORMAL HIGH (ref 11.4–15.2)

## 2017-05-15 LAB — COMPREHENSIVE METABOLIC PANEL
ALBUMIN: 2.2 g/dL — AB (ref 3.5–5.0)
ALT: 12 U/L — ABNORMAL LOW (ref 14–54)
ANION GAP: 12 (ref 5–15)
AST: 20 U/L (ref 15–41)
Alkaline Phosphatase: 86 U/L (ref 38–126)
BUN: 81 mg/dL — ABNORMAL HIGH (ref 6–20)
CHLORIDE: 103 mmol/L (ref 101–111)
CO2: 24 mmol/L (ref 22–32)
Calcium: 8.6 mg/dL — ABNORMAL LOW (ref 8.9–10.3)
Creatinine, Ser: 4.02 mg/dL — ABNORMAL HIGH (ref 0.44–1.00)
GFR calc Af Amer: 11 mL/min — ABNORMAL LOW (ref >60)
GFR calc non Af Amer: 9 mL/min — ABNORMAL LOW (ref >60)
GLUCOSE: 142 mg/dL — AB (ref 65–99)
POTASSIUM: 6.1 mmol/L — AB (ref 3.5–5.1)
SODIUM: 139 mmol/L (ref 135–145)
Total Bilirubin: 0.9 mg/dL (ref 0.3–1.2)
Total Protein: 5.4 g/dL — ABNORMAL LOW (ref 6.5–8.1)

## 2017-05-15 LAB — TROPONIN I
TROPONIN I: 0.06 ng/mL — AB (ref <0.03)
TROPONIN I: 0.06 ng/mL — AB (ref <0.03)

## 2017-05-15 LAB — CBC
HCT: 28.2 % — ABNORMAL LOW (ref 35.0–47.0)
Hemoglobin: 9.4 g/dL — ABNORMAL LOW (ref 12.0–16.0)
MCH: 30 pg (ref 26.0–34.0)
MCHC: 33.2 g/dL (ref 32.0–36.0)
MCV: 90.3 fL (ref 80.0–100.0)
Platelets: 177 K/uL (ref 150–440)
RBC: 3.13 MIL/uL — ABNORMAL LOW (ref 3.80–5.20)
RDW: 18.2 % — ABNORMAL HIGH (ref 11.5–14.5)
WBC: 16.8 K/uL — ABNORMAL HIGH (ref 3.6–11.0)

## 2017-05-15 LAB — PH, BODY FLUID: pH, Body Fluid: 8

## 2017-05-15 LAB — FIBRINOGEN: Fibrinogen: 481 mg/dL — ABNORMAL HIGH (ref 210–475)

## 2017-05-15 LAB — MAGNESIUM
Magnesium: 2.6 mg/dL — ABNORMAL HIGH (ref 1.7–2.4)
Magnesium: 2.8 mg/dL — ABNORMAL HIGH (ref 1.7–2.4)

## 2017-05-15 LAB — FIBRIN DERIVATIVES D-DIMER (ARMC ONLY): FIBRIN DERIVATIVES D-DIMER (ARMC): 1521.19 — AB (ref 0.00–499.00)

## 2017-05-15 LAB — PROCALCITONIN
PROCALCITONIN: 1.12 ng/mL
PROCALCITONIN: 1.05 ng/mL

## 2017-05-15 LAB — PHOSPHORUS
PHOSPHORUS: 3.2 mg/dL (ref 2.5–4.6)
Phosphorus: 3.6 mg/dL (ref 2.5–4.6)

## 2017-05-15 LAB — GLUCOSE, CAPILLARY: Glucose-Capillary: 136 mg/dL — ABNORMAL HIGH (ref 65–99)

## 2017-05-15 LAB — LACTIC ACID, PLASMA
LACTIC ACID, VENOUS: 2.2 mmol/L — AB (ref 0.5–1.9)
LACTIC ACID, VENOUS: 1.5 mmol/L (ref 0.5–1.9)

## 2017-05-15 LAB — SURGICAL PATHOLOGY

## 2017-05-15 MED ORDER — INSULIN REGULAR HUMAN 100 UNIT/ML IJ SOLN
10.0000 [IU] | Freq: Once | INTRAMUSCULAR | Status: AC
  Administered 2017-05-15: 10 [IU] via INTRAVENOUS
  Filled 2017-05-15: qty 0.1

## 2017-05-15 MED ORDER — DEXTROSE 50 % IV SOLN
50.0000 mL | Freq: Once | INTRAVENOUS | Status: AC
  Administered 2017-05-15: 50 mL via INTRAVENOUS
  Filled 2017-05-15: qty 50

## 2017-05-15 MED ORDER — PHENYLEPHRINE HCL 10 MG/ML IJ SOLN
0.0000 ug/min | INTRAMUSCULAR | Status: DC
  Administered 2017-05-15: 45 ug/min via INTRAVENOUS
  Administered 2017-05-15: 10 ug/min via INTRAVENOUS
  Administered 2017-05-16: 25 ug/min via INTRAVENOUS
  Filled 2017-05-15 (×2): qty 1

## 2017-05-15 MED ORDER — SODIUM CHLORIDE 0.9 % IV BOLUS (SEPSIS)
1000.0000 mL | INTRAVENOUS | Status: AC
  Administered 2017-05-15 (×2): 1000 mL via INTRAVENOUS

## 2017-05-15 MED ORDER — AMIODARONE HCL 200 MG PO TABS
200.0000 mg | ORAL_TABLET | Freq: Every day | ORAL | Status: DC
  Administered 2017-05-16 – 2017-05-17 (×2): 200 mg via ORAL
  Filled 2017-05-15 (×2): qty 1

## 2017-05-15 MED ORDER — AMIODARONE HCL 200 MG PO TABS
200.0000 mg | ORAL_TABLET | Freq: Two times a day (BID) | ORAL | Status: DC
  Administered 2017-05-15: 200 mg via ORAL
  Filled 2017-05-15: qty 1

## 2017-05-15 MED ORDER — METOCLOPRAMIDE HCL 5 MG/ML IJ SOLN: 5.0000 mg | Freq: Three times a day (TID) | INTRAMUSCULAR | Status: DC | PRN

## 2017-05-15 NOTE — Progress Notes (Signed)
HD completed without issue. Total UF 1.3L. BP drop during last 15 minutes of treatment. Patient remained asymptomatic throughout. Report given to primary RN.

## 2017-05-15 NOTE — Progress Notes (Signed)
PT Cancellation Note  Patient Details Name: Cindy Weaver MRN: [id number] DOB: 02/28/1926   Cancelled Treatment:    Reason Eval/Treat Not Completed: Other (comment)   Chart reviewed, Transferred to ICU yesterday.  No continue on transfer orders.  Discussed with primary PT on site Melton KrebsGalen Bratten who contacted MD.  OK to continue therapy and new therapy orders written as "as before".  Attempted to see pt this pm but receiving dialysis.  Will continue as appropriate and reschedule this afternoon as time allows.     Danielle DessSarah Lala Been 05/15/2017, 2:14 PM

## 2017-05-15 NOTE — Progress Notes (Signed)
OT Cancellation Note  Patient Details Name: Cindy Weaver MRN: [id number] DOB: 10/05/1926   Cancelled Treatment:    Reason Eval/Treat Not Completed: Medical issues which prohibited therapy. Chart reviewed. Pt transferred to CCU last evening due to low BP and A-fib RVR. No continue upon transfer orders. Per OT/PT protocol, will need new orders to treat.  Richrd PrimeJamie Stiller, MPH, MS, OTR/L ascom [phone] 05/15/17, 8:32 AM

## 2017-05-15 NOTE — Progress Notes (Signed)
[facility]   PATIENT NAME: Cindy FoleyLiller Weaver    MR#:  [id number]  DATE OF BIRTH:  02/29/1926  SUBJECTIVE:   Status post left hip hemiarthroplasty, postop day #4. today.  Transferred back to ICU overnight due to low BP and a. Fib w/ RVR on amio gtt.  Now weaned off amio gtt and HR has improved. BP improved.    REVIEW OF SYSTEMS:    Review of Systems  Constitutional: Negative for chills and fever.  HENT: Negative for congestion and tinnitus.   Eyes: Negative for blurred vision and double vision.  Respiratory: Negative for cough, shortness of breath and wheezing.   Cardiovascular: Negative for chest pain, orthopnea and PND.  Gastrointestinal: Negative for abdominal pain, diarrhea, nausea and vomiting.  Genitourinary: Negative for dysuria and hematuria.  Musculoskeletal: Positive for joint pain (Left Hip).  Neurological: Negative for dizziness, sensory change and focal weakness.  All other systems reviewed and are negative.   Nutrition: Dysphagia II with nectar thick liquids Tolerating Diet: Yes Tolerating PT: Await Eval.   DRUG ALLERGIES:   Allergies  Allergen Reactions  . Penicillins Itching and Rash    Other reaction(s): RASH  . Enalapril Other (See Comments)    cough  . Statins Other (See Comments)    Other reaction(s): MUSCLE PAIN    VITALS:  Blood pressure (!) 107/92, pulse (!) 113, temperature 98.1 F (36.7 C), resp. rate 15, height 5\' 8"  (1.727 m), weight 72.8 kg (160 lb 7.9 oz), SpO2 91 %.  PHYSICAL EXAMINATION:   Physical Exam  GENERAL:  83 y.o.-year-old patient lying in bed in mild Resp. Distress.  EYES: Pupils equal, round, reactive to light and accommodation. No scleral icterus. Extraocular muscles intact.  HEENT: Head atraumatic, normocephalic. Oropharynx and nasopharynx clear.  NECK:  Supple, no jugular venous distention. No thyroid enlargement, no tenderness.  LUNGS: Normal breath sounds bilaterally, no wheezing,  rales on right mid to lower lung fields, No rhonchi. No use of accessory muscles of respiration.  CARDIOVASCULAR: S1, S2 Irregular. No murmurs, rubs, or gallops.  ABDOMEN: Soft, nontender, nondistended. Bowel sounds present. No organomegaly or mass.  EXTREMITIES: No cyanosis, clubbing or edema b/l. Left Hip dressing w/ wound vac in place.     NEUROLOGIC: Cranial nerves II through XII are intact. No focal Motor or sensory deficits b/l.  Globally weak.  PSYCHIATRIC: The patient is alert and oriented x 3.  SKIN: No obvious rash, lesion, or ulcer.    LABORATORY PANEL:   CBC  Recent Labs Lab 05/15/17 0530  WBC 16.8*  HGB 9.4*  HCT 28.2*  PLT 177   ------------------------------------------------------------------------------------------------------------------  Chemistries   Recent Labs Lab 04/26/2017 2327 05/15/17 0530  NA 139 138  K 6.1* 5.3*  CL 103 105  CO2 24 24  GLUCOSE 142* 97  BUN 81* 76*  CREATININE 4.02* 4.12*  CALCIUM 8.6* 8.0*  MG 2.6* 2.8*  AST 20  --   ALT 12*  --   ALKPHOS 86  --   BILITOT 0.9  --    ------------------------------------------------------------------------------------------------------------------  Cardiac Enzymes  Recent Labs Lab 05/15/17 0530  TROPONINI 0.06*   ------------------------------------------------------------------------------------------------------------------  RADIOLOGY:  Dg Chest 1 View  Result Date: 05/09/2017 CLINICAL DATA:  Right pleural effusion. EXAM: CHEST 1 VIEW COMPARISON:  Radiograph of May 13, 2017. FINDINGS: Stable cardiomegaly. Atherosclerosis of thoracic aorta is noted. Stable mild diffuse interstitial densities are noted throughout both lungs most consistent with scarring, although superimposed edema or inflammation  cannot be excluded. No pneumothorax is noted. Minimal bilateral pleural effusions are noted. Sternotomy wires are noted. IMPRESSION: Aortic atherosclerosis. Stable mild diffuse interstitial  densities throughout both lungs most consistent with scarring, although superimposed edema or inflammation cannot be excluded. Minimal bilateral pleural effusions. Electronically Signed   By: Lupita RaiderJames  Green Jr, M.D.   On: 04/18/2017 12:14   Dg Chest Port 1 View  Result Date: 05/15/2017 CLINICAL DATA:  Chronic renal failure.  Congestive heart failure EXAM: PORTABLE CHEST 1 VIEW COMPARISON:  May 14, 2017 FINDINGS: There is diffuse interstitial pulmonary edema with small pleural effusions bilaterally. There is no appreciable airspace consolidation. There is cardiomegaly with pulmonary venous hypertension. There is aortic atherosclerosis. Patient is status post median sternotomy. No adenopathy. Bones are osteoporotic. There is calcification in both carotid arteries. IMPRESSION: Persistent changes of congestive heart failure. No airspace consolidation. There is aortic atherosclerosis as well as calcification in both carotid arteries. Aortic Atherosclerosis (ICD10-I70.0). Electronically Signed   By: Bretta BangWilliam  Woodruff III M.D.   On: 05/15/2017 07:05   Dg Chest Port 1 View  Result Date: 04/16/2017 CLINICAL DATA:  Left hip hemiarthroplasty. Postoperative day 3. Prior thoracentesis. EXAM: PORTABLE CHEST 1 VIEW COMPARISON:  Ultrasound 04/22/2017. Chest x-ray 04/15/2017, 05/13/2017. FINDINGS: Prior CABG. Cardiomegaly with slight increase pulmonary interstitial prominence and bilateral pleural effusions. Findings suggest congestive heart failure. No pneumothorax IMPRESSION: 1. Prior CABG. Cardiomegaly with bilateral increased pulmonary interstitial prominence and bilateral pleural effusions most consistent with CHF. 2. No pneumothorax . Electronically Signed   By: Maisie Fushomas  Register   On: 05/03/2017 14:41   [place] Thoracentesis Asp Pleural Space W/img Guide  Result Date: 05/03/2017 INDICATION: Pleural effusions, larger on the right EXAM: ULTRASOUND GUIDED RIGHT THORACENTESIS MEDICATIONS: 1% lidocaine local COMPLICATIONS:  None immediate. PROCEDURE: An ultrasound guided thoracentesis was thoroughly discussed with the patient and questions answered. The benefits, risks, alternatives and complications were also discussed. The patient understands and wishes to proceed with the procedure. Written consent was obtained. Ultrasound was performed to localize and mark an adequate pocket of fluid in the right chest. The area was then prepped and draped in the normal sterile fashion. 1% Lidocaine was used for local anesthesia. Under ultrasound guidance a 6 Fr Safe-T-Centesis catheter was introduced. Thoracentesis was performed. The catheter was removed and a dressing applied. FINDINGS: A total of approximately 850 cc of clear serosanguineous fluid was removed. Samples were sent to the laboratory as requested by the clinical team. IMPRESSION: Successful ultrasound guided right thoracentesis yielding 850 cc of pleural fluid. Electronically Signed   By: Judie PetitM.  Shick M.D.   On: 05/15/2017 11:33     ASSESSMENT AND PLAN:   83 year old female with past medical history of chronic afibrillation, history of coronary artery disease status post bypass, history of mitral levels placement, chronic respiratory failure, acute systolic CHF who presented to the hospital after a fall and noted to have left hip fracture.  1. Status post fall and left hip fracture-seen by orthopedics and is status post left hip hemiarthroplasty postoperative day #4 today. - Await PT eval once the temp dialysis cath [facility].    2. Atrial fibrillation with rapid ventricular response-Patient went into A. fib with RVR overnight and had to be transferred to the intensive care unit and placed on amiodarone drip. Now weaned off the drip already and will switch over to oral amiodarone. Continue metoprolol, continue to hold Coumadin and his INR is supratherapeutic.  3. Acute kidney injury-baseline creatinine around 1.2-1.3. Suspect  this is ATN from  hypotension -Did not respond to fluids, therefore started on hemodialysis. Nephrology following, and remains oliguric and volume overloaded. -Plan for hemodialysis again today. Plan to change the left groin temporary catheter to perm catheter hopefully next 1-2 days.-  4. Pleural effusion-patient's chest x-ray yesterday morning showed a significant right-sided pleural effusion.  -Status post right-sided ultrasound-guided thoracentesis with 850 cc of fluid removed yesterday.  - will repeat CXR in a.m. Tomorrow.   5. COPD-no acute exacerbation-continue albuterol nebulizers, prednisone, Spiriva.  6. GERD-continue Protonix.  7. Acute blood loss anemia-secondary to recent surgery. -Transfused 1 unit of packed red blood cells and hemoglobin is stable. Continue to follow serial hemoglobins for now.  8. Constipation-continue MiraLAX, Senokot.   All the records are reviewed and case discussed with Care Management/Social Worker. Management plans discussed with the patient, family and they are in agreement.  CODE STATUS: Full  DVT Prophylaxis: Heparin drip/Coumadin  TOTAL TIME TAKING CARE OF THIS PATIENT: 30 minutes.   POSSIBLE D/C IN 1-2 DAYS, DEPENDING ON CLINICAL CONDITION.   Houston Siren M.D on 05/15/2017 at 3:37 PM  Between 7am to 6pm - Pager - [phone]  After 6pm go to [email or web address] - Therapist, nutritional Hospitalists  Office  [phone]  CC: Primary care physician; [facility]

## 2017-05-15 NOTE — Progress Notes (Signed)
ANTICOAGULATION CONSULT NOTE - Initial Consult  Pharmacy Consult for warfarin Indication: VTE prophylaxis and warfarin  Allergies  Allergen Reactions  . Penicillins Itching and Rash    Other reaction(s): RASH  . Enalapril Other (See Comments)    cough  . Statins Other (See Comments)    Other reaction(s): MUSCLE PAIN    Patient Measurements: Height: 5\' 8"  (172.7 cm) Weight: 157 lb 6.5 oz (71.4 kg) IBW/kg (Calculated) : 63.9  Vital Signs: Temp: 98.1 F (36.7 C) (07/31 1346) Temp Source: Oral (07/31 0900) BP: 103/73 (07/31 1600) Pulse Rate: 93 (07/31 1600)  Labs:  Recent Labs  05/12/17 1747  05/13/17 0252 04/20/2017 0400 05/03/2017 1706 04/19/2017 2327 05/15/17 0530  HGB  --   < > 9.8* 9.1*  --  9.5* 9.4*  HCT  --   < > 29.7* 27.5*  --  28.5* 28.2*  PLT  --   < > 186 190  --  200 177  LABPROT  --   < > 34.7* 47.2* 43.3* 38.5* 33.2*  INR  --   < > 3.35 4.92* 4.41* 3.81 3.17  HEPARINUNFRC 0.10*  --  0.39  --   --   --   --   CREATININE  --   < > 3.20* 3.68*  --  4.02* 4.12*  TROPONINI  --   --   --   --   --  0.06* 0.06*  < > = values in this interval not displayed.  Estimated Creatinine Clearance: 9.5 mL/min (A) (by C-G formula based on SCr of 4.12 mg/dL (H)).   Medical History: Past Medical History:  Diagnosis Date  . A-fib (HCC)    a. on Coumadin; b. CHADS2VASc => 5 (CHF, age x 2, vascular disease, female)  . Chronic systolic CHF (congestive heart failure) (HCC)   . Coronary artery disease    a. s/p CABG w/ SVG-PRDA  . S/P mitral valve clip implantation   . Status post aortic valve replacement     Assessment: Patient was taking warfarin PTA which is now being resumed post-op. Patient underwent surgery for femoral neck fracture today.   Warfarin was held for 9 days and patient has received phytonadione 9 days. Patient was bridged with heparin drip prior to surgery.   Home dose = warfarin 2.5 mg PO daily- pt also takes vit K 200mcg daily at home  Patient was  started on amiodarone since admission- drug interaction with warfarin  Goal of Therapy:  INR 2-3 Monitor platelets by anticoagulation protocol: Yes   Plan:  Will continue to hold Coumadin and f/u AM INR.  Luisa HartScott Bailee Thall, PharmD Clinical Pharmacist   05/15/2017,4:26 PM

## 2017-05-15 NOTE — Progress Notes (Signed)
[facility]  ROUNDING NOTE   Subjective:   Left hip surgery 7/27  Underwent RIght thoracentesis. 850 cc of clear fluid was removed Patient continues to be Short of breath; was transferred to ICU overnight for arrhythmia. A Fib with RVR Placed on Amiodarone infusion Currently requiring [place] oxygen   Objective:  Vital signs in last 24 hours:  Temp:  [97.7 F (36.5 C)-98.7 F (37.1 C)] 98.2 F (36.8 C) (07/31 0900) Pulse Rate:  [82-149] 100 (07/31 1000) Resp:  [15-22] 20 (07/31 1000) BP: (80-113)/(51-71) 102/68 (07/31 1000) SpO2:  [88 %-100 %] 95 % (07/31 1000) Weight:  [72.4 kg (159 lb 9.8 oz)] 72.4 kg (159 lb 9.8 oz) (07/31 0100)  Weight change: -7.161 kg (-15 lb 12.6 oz) Filed Weights   04/19/2017 0509 04/26/2017 0644 05/15/17 0100  Weight: 69.8 kg (153 lb 12.8 oz) 79.6 kg (175 lb 6.4 oz) 72.4 kg (159 lb 9.8 oz)    Intake/Output: I/O last 3 completed shifts: In: 302.6 [I.V.:302.6] Out: 755 [Urine:275; Drains:480]   Intake/Output this shift:  Total I/O In: 68.6 [I.V.:68.6] Out: -   Physical Exam: General: Critically ill appearing  Head: Moist oral mucosal membranes  Eyes: Anicteric,    Neck: Supple,   Lungs:  + diffuse bilateral crackles  Heart: irregular  Abdomen:  Soft, nontender  Extremities: + dependent peripheral edema.   Neurologic: Able to answer few questions  Skin: Noacute lesions  Access: Right femoral temp HD cathter Dr. Lucky Cowboy 5/00    Basic Metabolic Panel:  Recent Labs Lab 05/10/17 0306  05/12/17 0441 05/13/17 0252 04/26/2017 0400 05/03/2017 2327 05/15/17 0530  NA 137  < > 139 137 137 139 138  K 3.9  < > 4.5 4.5 4.8 6.1* 5.3*  CL 100*  < > 103 102 102 103 105  CO2 28  < > _0 GLUCOSE 99  < > 77 104* 98 142* 97  BUN 46*  < > 42* 58* 74* 81* 76*  CREATININE 2.48*  < > 2.59* 3.20* 3.68* 4.02* 4.12*  CALCIUM 8.0*  < > 8.2* 8.2* 8.2* 8.6* 8.0*  MG  --   --  2.0  --   --  2.6* 2.8*  PHOS 2.2*  --  3.5  --   --  3.6 3.2  < > =  values in this interval not displayed.  Liver Function Tests:  Recent Labs Lab 05/09/17 0655 05/10/17 0306 04/23/2017 2327  AST 23  --  20  ALT 15  --  12*  ALKPHOS 73  --  86  BILITOT 0.7  --  0.9  PROT 5.5*  --  5.4*  ALBUMIN 2.3* 2.1* 2.2*   No results for input(s): LIPASE, AMYLASE in the last 168 hours. No results for input(s): AMMONIA in the last 168 hours.  CBC:  Recent Labs Lab 05/12/17 0441 05/13/17 0252 05/09/2017 0400 04/25/2017 2327 05/15/17 0530  WBC 16.0* 19.3* 20.4* 19.6* 16.8*  HGB 10.1* 9.8* 9.1* 9.5* 9.4*  HCT 30.3* 29.7* 27.5* 28.5* 28.2*  MCV 89.5 90.7 89.9 89.4 90.3  PLT 167 186 190 200 177    Cardiac Enzymes:  Recent Labs Lab 05/01/2017 2327 05/15/17 0530  TROPONINI 0.06* 0.06*    BNP: Invalid input(s): POCBNP  CBG:  Recent Labs Lab 05/03/2017 1638 05/12/17 1614 05/15/17 0037  GLUCAP 60 117* 136*    Microbiology: Results for orders placed or performed during the hospital encounter of 04/24/2017  MRSA PCR Screening  Status: None   Collection Time: 05/04/17  8:27 AM  Result Value Ref Range Status   MRSA by PCR NEGATIVE NEGATIVE Final    Comment:        The GeneXpert MRSA Assay (FDA approved for NASAL specimens only), is one component of a comprehensive MRSA colonization surveillance program. It is not intended to diagnose MRSA infection nor to guide or monitor treatment for MRSA infections.   Culture, expectorated sputum-assessment     Status: None   Collection Time: 05/05/17 10:55 AM  Result Value Ref Range Status   Specimen Description SPUTUM  Final   Special Requests Normal  Final   Sputum evaluation THIS SPECIMEN IS ACCEPTABLE FOR SPUTUM CULTURE  Final   Report Status 05/05/2017 FINAL  Final  Culture, respiratory (NON-Expectorated)     Status: None   Collection Time: 05/05/17 10:55 AM  Result Value Ref Range Status   Specimen Description SPUTUM  Final   Special Requests Normal Reflexed from [id number]  Final   Gram Stain    Final    FEW WBC PRESENT, PREDOMINANTLY PMN FEW GRAM NEGATIVE RODS FEW GRAM POSITIVE COCCI IN PAIRS FEW GRAM POSITIVE RODS RARE BUDDING YEAST SEEN    Culture   Final    Consistent with normal respiratory flora. Performed at [facility], [place] [address], [place], [place] [address]    Report Status 04/16/2017 FINAL  Final  Gram stain     Status: None   Collection Time: 04/25/2017 11:25 AM  Result Value Ref Range Status   Specimen Description FLUID PLEURAL  Final   Special Requests NONE  Final   Gram Stain   Final    CYTOSPIN SMEAR WBC PRESENT,BOTH PMN AND MONONUCLEAR NO ORGANISMS SEEN    Report Status 04/26/2017 FINAL  Final    Coagulation Studies:  Recent Labs  05/13/17 0252 04/30/2017 0400 04/24/2017 1706 04/20/2017 2327 05/15/17 0530  LABPROT 34.7* 47.2* 43.3* 38.5* 33.2*  INR 3.35 4.92* 4.41* 3.81 3.17    Urinalysis: No results for input(s): COLORURINE, LABSPEC, PHURINE, GLUCOSEU, HGBUR, BILIRUBINUR, KETONESUR, PROTEINUR, UROBILINOGEN, NITRITE, LEUKOCYTESUR in the last 72 hours.  Invalid input(s): APPERANCEUR    Imaging: Dg Chest 1 View  Result Date: 05/12/2017 CLINICAL DATA:  Right pleural effusion. EXAM: CHEST 1 VIEW COMPARISON:  Radiograph of May 13, 2017. FINDINGS: Stable cardiomegaly. Atherosclerosis of thoracic aorta is noted. Stable mild diffuse interstitial densities are noted throughout both lungs most consistent with scarring, although superimposed edema or inflammation cannot be excluded. No pneumothorax is noted. Minimal bilateral pleural effusions are noted. Sternotomy wires are noted. IMPRESSION: Aortic atherosclerosis. Stable mild diffuse interstitial densities throughout both lungs most consistent with scarring, although superimposed edema or inflammation cannot be excluded. Minimal bilateral pleural effusions. Electronically Signed   By: Marijo Conception, M.D.   On: 05/03/2017 12:14   Dg Chest Port 1 View  Result Date: 05/15/2017 CLINICAL DATA:   Chronic renal failure.  Congestive heart failure EXAM: PORTABLE CHEST 1 VIEW COMPARISON:  May 14, 2017 FINDINGS: There is diffuse interstitial pulmonary edema with small pleural effusions bilaterally. There is no appreciable airspace consolidation. There is cardiomegaly with pulmonary venous hypertension. There is aortic atherosclerosis. Patient is status post median sternotomy. No adenopathy. Bones are osteoporotic. There is calcification in both carotid arteries. IMPRESSION: Persistent changes of congestive heart failure. No airspace consolidation. There is aortic atherosclerosis as well as calcification in both carotid arteries. Aortic Atherosclerosis (ICD10-I70.0). Electronically Signed   By: Lowella Grip III M.D.  On: 05/15/2017 07:05   Dg Chest Port 1 View  Result Date: 05/13/2017 CLINICAL DATA:  Left hip hemiarthroplasty. Postoperative day 3. Prior thoracentesis. EXAM: PORTABLE CHEST 1 VIEW COMPARISON:  Ultrasound 05/10/2017. Chest x-ray 04/29/2017, 05/13/2017. FINDINGS: Prior CABG. Cardiomegaly with slight increase pulmonary interstitial prominence and bilateral pleural effusions. Findings suggest congestive heart failure. No pneumothorax IMPRESSION: 1. Prior CABG. Cardiomegaly with bilateral increased pulmonary interstitial prominence and bilateral pleural effusions most consistent with CHF. 2. No pneumothorax . Electronically Signed   By: Marcello Moores  Register   On: 04/30/2017 14:41   US Thoracentesis Asp Pleural Space W/img Guide  Result Date: 04/28/2017 INDICATION: Pleural effusions, larger on the right EXAM: ULTRASOUND GUIDED RIGHT THORACENTESIS MEDICATIONS: 1% lidocaine local COMPLICATIONS: None immediate. PROCEDURE: An ultrasound guided thoracentesis was thoroughly discussed with the patient and questions answered. The benefits, risks, alternatives and complications were also discussed. The patient understands and wishes to proceed with the procedure. Written consent was obtained. Ultrasound  was performed to localize and mark an adequate pocket of fluid in the right chest. The area was then prepped and draped in the normal sterile fashion. 1% Lidocaine was used for local anesthesia. Under ultrasound guidance a 6 Fr Safe-T-Centesis catheter was introduced. Thoracentesis was performed. The catheter was removed and a dressing applied. FINDINGS: A total of approximately 850 cc of clear serosanguineous fluid was removed. Samples were sent to the laboratory as requested by the clinical team. IMPRESSION: Successful ultrasound guided right thoracentesis yielding 850 cc of pleural fluid. Electronically Signed   By: Jerilynn Mages.  Shick M.D.   On: 05/09/2017 11:33     Medications:   . phenylephrine (NEO-SYNEPHRINE) Adult infusion     . albuterol  2.5 mg Nebulization TID  . [START ON 05/16/2017] amiodarone  200 mg Oral Daily  . budesonide (PULMICORT) nebulizer solution  0.5 mg Nebulization BID  . chlorhexidine  15 mL Mouth Rinse BID  . feeding supplement (ENSURE ENLIVE)  237 mL Oral TID BM  . ferrous sulfate  325 mg Oral Q breakfast  . mouth rinse  15 mL Mouth Rinse q12n4p  . multivitamin with minerals  1 tablet Oral Daily  . pantoprazole  40 mg Oral Daily  . phytonadione  0.2 mg Oral Daily  . polyethylene glycol  17 g Oral Daily  . predniSONE  20 mg Oral Q breakfast  . senna-docusate  2 tablet Oral BID  . tiotropium  18 mcg Inhalation Daily  . Warfarin - Pharmacist Dosing Inpatient   Does not apply q1800   acetaminophen **OR** acetaminophen, bisacodyl, guaiFENesin, magnesium hydroxide, menthol-cetylpyridinium **OR** phenol, [DISCONTINUED] metoCLOPramide **OR** metoCLOPramide (REGLAN) injection, morphine injection, [DISCONTINUED] ondansetron **OR** ondansetron (ZOFRAN) IV, oxyCODONE, pneumococcal 23 valent vaccine, traMADol  Assessment/ Plan:  Ms. WESTLYNN FIFER is a 83 y.o. black female with Chronic kidney disease stage III baseline EGFR 52, congestive heart failure, hyperlipidemia, disease status  post CABG, anemia of chronic kidney disease, chronic atrial fibrillation, severe mitral regurgitation, hypertension, GERD, COPD,   Hospital course, patient was admitted to [facility] on 05/13/2017 for evaluation of Fall with left hip fracture and also had Atrial fibrillation with RVR and acute renal failure. Hip surgery was postponed until 7/27 due to coagulopathy with elevated INR. Taken off warfarin, restarted after surgery. Now on heparin gtt.   1. Acute renal failure on chronic kidney disease stage III: baseline creatinine 1.18, GFR of 52 from 04/01/17 Oliguric urine output.  Required Hemodialysis treatment on 7/24 and 7/26 Respiratory status is still tenuous and she is  volume overload.  - permcath placement was deferred due to high INR - Low BP is limiting fluid removal with HD - Dialysis today primarily for volume removal  2. Anemia of chronic kidney disease. Hemoglobin 9.4. Status post multiple transfusions this admission - iron supplements  3. Hypotension with atrial fibrillation with rapid ventricular response:  - limiting volume removal with HD - on amiodarone  4. LE edema - as above      LOS: 13 Jquan Egelston 7/31/201810:19 AM

## 2017-05-15 NOTE — Consult Note (Signed)
ORTHOPAEDICS PROGRESS NOTE  PATIENT NAME: Cindy Weaver DOB: 12/19/1925  MRN: [id number]  POD # 4: Left hip hemiarthroplasty for femoral neck fracture  Subjective: The patient is awake and alert this morning. She was apparently transferred to ICU with rapid ventricular rate and hypertension last night. She was placed on an amiodarone drip with control of her rate. No pressors were apparently needed. The patient denied any symptoms. She denied any chest pain.  Objective: Vital signs in last 24 hours: Temp:  [97.7 F (36.5 C)-98.7 F (37.1 C)] 98.2 F (36.8 C) (07/31 0040) Pulse Rate:  [82-149] 104 (07/31 0600) Resp:  [15-22] 17 (07/31 0600) BP: (80-113)/(51-70) 93/68 (07/31 0600) SpO2:  [88 %-100 %] 100 % (07/31 0600) Weight:  [72.4 kg (159 lb 9.8 oz)] 72.4 kg (159 lb 9.8 oz) (07/31 0100)  Intake/Output from previous day: 07/30 0701 - 07/31 0700 In: 302.6 [I.V.:302.6] Out: 555 [Urine:75; Drains:480]   Recent Labs  05/13/17 0252 05/03/2017 0400  05/04/2017 2327 05/15/17 0530  WBC 19.3* 20.4*  --  19.6* 16.8*  HGB 9.8* 9.1*  --  9.5* 9.4*  HCT 29.7* 27.5*  --  28.5* 28.2*  PLT 186 190  --  200 177  K 4.5 4.8  --  6.1* 5.3*  CL 102 102  --  103 105  CO2 26 25  --  24 24  BUN 58* 74*  --  81* 76*  CREATININE 3.20* 3.68*  --  4.02* 4.12*  GLUCOSE 104* 98  --  142* 97  CALCIUM 8.2* 8.2*  --  8.6* 8.0*  INR 3.35 4.92*  < > 3.81 3.17  < > = values in this interval not displayed.  EXAM General: Frail appearing female in no apparent discomfort. Lungs: Clear breath sounds although somewhat diminished at the bases. Cardiac: Irregular rate and rhythm. Left lower extremity: Wound VAC is in place. No apparent erythema or ecchymosis. No significant thigh swelling. Neurologic: Awake, alert, and oriented. Sensory motor function are grossly intact.  Assessment: Left hip hemiarthroplasty for femoral neck fracture  Secondary diagnoses: S/p aortic valve replacement CAD Chronic  systolic CHF Atrial fibrillation Acute renal failure  Anemia  Plan: Still at strict bedrest due to temporary femoral HD catheter. INR was still elevated yesterday so plans for changing the access sites were delayed as per Vascular Surgery. Creatinine continues to rise. Dialysis as per Nephrology. Coumadin has been held due to supratherapeutic levels after single dose. Continue bed exercises with physical therapy until such time as we can better mobilize her.  Kenan Moodie P. Angie FavaHooten, Jr. M.D.

## 2017-05-15 NOTE — Progress Notes (Signed)
Hemodialysis- BP 80s/90s systolic. Spoke with Dr. Thedore MinsSingh. Will ask primary RN to restart Neo for bp support

## 2017-05-15 NOTE — Progress Notes (Signed)
Pre dialysis  

## 2017-05-15 NOTE — Progress Notes (Signed)
[facility]

## 2017-05-15 NOTE — Progress Notes (Signed)
SLP Cancellation Note  Patient Details Name: Cindy Weaver MRN: [id number] DOB: 03/06/1926   Cancelled treatment:       Reason Eval/Treat Not Completed: Patient at procedure or test/unavailable (chart reviewed; pt receiving hemodialysis) ST services will f/u w/ pt tomorrow for dysphagia tx in order to attempt po trials to upgrade diet; possible need for objective swallow study(TBD). Contacted NSG.    Jerilynn SomKatherine Reida Hem, MS, CCC-SLP Sallyann Kinnaird 05/15/2017, 2:07 PM

## 2017-05-15 NOTE — Progress Notes (Signed)
HD initiated at bedside in ICU. Patient currently resting quietly. 4L [place] with sats >90%. Currently denies complaints.

## 2017-05-15 NOTE — Progress Notes (Signed)
Post hd assessment unchanged  

## 2017-05-15 NOTE — Progress Notes (Signed)
Daily Progress Note   Patient Name: Cindy Weaver       Date: 05/15/2017 DOB: 1926/05/04  Age: 83 y.o. MRN#: [id number] Attending Physician: Henreitta Leber, MD Primary Care Physician: [place] Date: 04/28/2017  Reason for Consultation/Follow-up: Establishing goals of care  Subjective: Cindy Weaver is resting with brother at bedside. Continues with wet cough but nausea is gone and pain is managed well.   Length of Stay: 13  Current Medications: Scheduled Meds:  . albuterol  2.5 mg Nebulization TID  . [START ON 05/16/2017] amiodarone  200 mg Oral Daily  . budesonide (PULMICORT) nebulizer solution  0.5 mg Nebulization BID  . chlorhexidine  15 mL Mouth Rinse BID  . feeding supplement (ENSURE ENLIVE)  237 mL Oral TID BM  . ferrous sulfate  325 mg Oral Q breakfast  . mouth rinse  15 mL Mouth Rinse q12n4p  . multivitamin with minerals  1 tablet Oral Daily  . pantoprazole  40 mg Oral Daily  . phytonadione  0.2 mg Oral Daily  . polyethylene glycol  17 g Oral Daily  . predniSONE  20 mg Oral Q breakfast  . senna-docusate  2 tablet Oral BID  . tiotropium  18 mcg Inhalation Daily  . Warfarin - Pharmacist Dosing Inpatient   Does not apply q1800    Continuous Infusions: . phenylephrine (NEO-SYNEPHRINE) Adult infusion      PRN Meds: acetaminophen **OR** acetaminophen, bisacodyl, guaiFENesin, magnesium hydroxide, menthol-cetylpyridinium **OR** phenol, [DISCONTINUED] metoCLOPramide **OR** metoCLOPramide (REGLAN) injection, morphine injection, [DISCONTINUED] ondansetron **OR** ondansetron (ZOFRAN) IV, oxyCODONE, pneumococcal 23 valent vaccine, traMADol  Physical Exam         Constitutional: She is oriented to person, place, and time. She appears well-developed.  Head:  Normocephalic and atraumatic.  Cardiovascular: An irregularly irregular rhythm present. Tachycardia present.   Pulmonary/Chest: Effort normal. No accessory muscle usage. No tachypnea. Mild-mod respiratory distress.  Productive cough, Rhonchi Abdominal: Soft. Normal appearance.  Neurological: She is alert and oriented to person, place, and time.  She is tired and restless  Nursing note and vitals reviewed.  Vital Signs: BP (!) 85/61 (BP Location: Right Arm)   Pulse 92   Temp 98.2 F (36.8 C) (Oral)   Resp (!) 21   Ht 5' 8"  (1.727 m)   Wt 72.4 kg (  159 lb 9.8 oz)   SpO2 95%   BMI 24.27 kg/m  SpO2: SpO2: 95 % O2 Device: O2 Device: Nasal Cannula O2 Flow Rate: O2 Flow Rate (L/min): 2 L/min  Intake/output summary:   Intake/Output Summary (Last 24 hours) at 05/15/17 1007 Last data filed at 05/15/17 0900  Gross per 24 hour  Intake           371.26 ml  Output              555 ml  Net          -183.74 ml   LBM: Last BM Date: 05/15/2017 Baseline Weight: Weight: 56.2 kg (124 lb) Most recent weight: Weight: 72.4 kg (159 lb 9.8 oz)       Palliative Assessment/Data:    Flowsheet Rows     Most Recent Value  Intake Tab  Referral Department  Hospitalist  Unit at Time of Referral  Cardiac/Telemetry Unit  Palliative Care Primary Diagnosis  Cardiac  Date Notified  05/08/2017  Palliative Care Type  New Palliative care  Reason for referral  Clarify Goals of Care  Date of Admission  05/10/2017  # of days IP prior to Palliative referral  5  Clinical Assessment  Psychosocial & Spiritual Assessment  Palliative Care Outcomes      Patient Active Problem List   Diagnosis Date Noted  . Pressure injury of skin 05/13/2017  . Cough   . Non-intractable vomiting with nausea   . Acute renal failure (ARF) ([place])   . Goals of care, counseling/discussion   . Palliative care encounter   . Malnutrition of moderate degree 05/04/2017  . Atrial fibrillation with RVR ([place]) 04/16/2017  . Hip fracture  ([place]) 05/12/2017  . Acute on chronic systolic CHF (congestive heart failure) ([place]) 05/12/2017  . Acute on chronic respiratory failure with hypoxia ([place]) 04/17/2017  . Status post aortic valve replacement 05/12/2017  . Status post mitral valve repair 04/15/2017  . AKI (acute kidney injury) ([place]) 05/15/2017  . History of Coumadin therapy 05/09/2017  . Elevated troponin 04/19/2017  . Hypotension 05/01/2017  . A-fib ([place]) 06/17/2015  . Dental disease 06/17/2015  . MI (mitral incompetence) 04/25/2015  . Bursitis of knee 10/19/2014  . Anemia 02/18/2014  . Positional vertigo 12/15/2013  . CCF (congestive cardiac failure) ([place]) 05/22/2008    Palliative Care Assessment & Plan   HPI: 83 y.o. female  with past medical history of systolic CHF (EF 88%) on 2L at home, CAD, atrial fibrillation, s/p aortic valve replacement, s/p mitral valve clipping for severe mitral regurgitation admitted on 05/12/2017 with fall with left hip fracture. Hospital stay complicated by atrial fibrillation and heart failure which has delayed surgery to repair hip fracture. Atrial fibrillation controlled and now with worsening renal failure and beginning trial of dialysis 05/08/17.    Assessment: I met again with Cindy Weaver. She continues to be extremely weak and debilitated. She has completed surgical repair of hip fracture Friday successfully. Main issue now is renal function and weakness. She says that she has not been SOB since thoracentesis yesterday. Overall says she feels better. We discuss that we will have to wait on renal visit today as she is likely to need more dialysis - she says "I'm scared to see him." I think she fears that she will need dialysis. We did not discuss this today though as we have to see if this is a realistic option for her if dialysis indicated long term. She has a long ways  to go and many barriers. Patient and family have continued to be hopeful for improvement and desire aggressive care. Family  very supportive with usually multiple people at bedside at all times. I will continue to follow and support.   Recommendations/Plan:  Wet/congested cough: Basically the same. Likely needs fluid removed with dialysis - await renal.   Nausea: Continue Zofran prn. Reglan prn. Resolved!  Insomnia: If not improved with treatment of other symptoms may consider trazodone 25 mg qhs. This has also improved.   Goals of Care and Additional Recommendations:  Limitations on Scope of Treatment: Full Scope Treatment  Code Status:  Full code  Prognosis:   Unable to determine but poor given multiple severe comorbitities   Discharge Planning:  To Be Determined   Thank you for allowing the Palliative Medicine Team to assist in the care of this patient.   Total Time 21mn Prolonged Time Billed  no       Greater than 50%  of this time was spent counseling and coordinating care related to the above assessment and plan.  AVinie Sill NP Palliative Medicine Team Pager # [phone](M-F 8a-5p) Team Phone # [phone](Nights/Weekends)

## 2017-05-15 NOTE — Progress Notes (Signed)
Pre hd assessment  

## 2017-05-15 NOTE — Progress Notes (Signed)
Patient Name: Cindy Weaver W Sacco Date of Encounter: 05/15/2017  [facility]  Hospital Problem List     Principal Problem:   Hip fracture [facility](HCC) Active Problems:   Atrial fibrillation with RVR (HCC)   Acute on chronic systolic CHF (congestive heart failure) (HCC)   Acute on chronic respiratory failure with hypoxia (HCC)   Anemia   Status post aortic valve replacement   Status post mitral valve repair   AKI (acute kidney injury) (HCC)   History of Coumadin therapy   Elevated troponin   Hypotension   Malnutrition of moderate degree   Acute renal failure (ARF) (HCC)   Goals of care, counseling/discussion   Palliative care encounter   Cough   Non-intractable vomiting with nausea   Pressure injury of skin     Subjective   Underwent left hip hemiarthroplasty on 7/27. On postop day #3, she developed Afib with RVR with heart rates into the 140s bpm. Was placed back on amiodarone infusion and given PO metoprolol. Transferred back to the ICU. Heart rates are improved back into the 90s bpm, remains in Afib (chronic). INR improving at 3.17. SCr continues to trend upwards. Still requiring HD. Permcath placement was deferred 2/2 elevated INR. Soft BP has limited fluid removal with HD. Never with chest pain. SOB stable. Mild nonproductive cough.   Inpatient Medications    Scheduled Meds: . albuterol  2.5 mg Nebulization TID  . amiodarone  200 mg Oral BID  . budesonide (PULMICORT) nebulizer solution  0.5 mg Nebulization BID  . chlorhexidine  15 mL Mouth Rinse BID  . feeding supplement (ENSURE ENLIVE)  237 mL Oral TID BM  . ferrous sulfate  325 mg Oral Q breakfast  . fluticasone  1 spray Each Nare Daily  . mouth rinse  15 mL Mouth Rinse q12n4p  . multivitamin with minerals  1 tablet Oral Daily  . pantoprazole  40 mg Oral Daily  . phytonadione  0.2 mg Oral Daily  . polyethylene glycol  17 g Oral Daily  . predniSONE  20 mg Oral Q breakfast  . senna-docusate  2 tablet Oral  BID  . tiotropium  18 mcg Inhalation Daily  . Warfarin - Pharmacist Dosing Inpatient   Does not apply q1800   Continuous Infusions: . phenylephrine (NEO-SYNEPHRINE) Adult infusion     PRN Meds: acetaminophen **OR** acetaminophen, bisacodyl, guaiFENesin, magnesium hydroxide, menthol-cetylpyridinium **OR** phenol, metoCLOPramide **OR** metoCLOPramide (REGLAN) injection, morphine injection, ondansetron **OR** ondansetron (ZOFRAN) IV, oxyCODONE, pneumococcal 23 valent vaccine, traMADol   Vital Signs    Vitals:   05/15/17 0400 05/15/17 0500 05/15/17 0600 05/15/17 0756  BP: 113/69 98/61 93/68    Pulse: (!) 102 97 (!) 104   Resp: 18 (!) 22 17   Temp:      TempSrc:      SpO2: 96% 100% 100% 100%  Weight:      Height:        Intake/Output Summary (Last 24 hours) at 05/15/17 0817 Last data filed at 05/15/17 0600  Gross per 24 hour  Intake           302.62 ml  Output              555 ml  Net          -252.38 ml   Filed Weights   04/28/2017 0509 05/05/2017 0644 05/15/17 0100  Weight: 153 lb 12.8 oz (69.8 kg) 175 lb 6.4 oz (79.6 kg) 159 lb 9.8 oz (72.4 kg)  Physical Exam    GEN: Well nourished, well developed, in no acute distress.  HEENT: Grossly normal.  Neck: Supple, no JVD, carotid bruits, or masses. Cardiac: Irregularly irregular, II/VI systolic murmur, no rubs, or gallops. No clubbing, cyanosis, edema.  Radials/DP/PT 2+ and equal bilaterally.  Respiratory:  Respirations regular and unlabored, clear to auscultation bilaterally. GI: Soft, nontender, nondistended, BS + x 4. MS: no deformity or atrophy. Skin: warm and dry, no rash. Neuro:  Strength and sensation are intact. Psych: AAOx3.  Normal affect.  Labs    CBC  Recent Labs  05/11/2017 2327 05/15/17 0530  WBC 19.6* 16.8*  HGB 9.5* 9.4*  HCT 28.5* 28.2*  MCV 89.4 90.3  PLT 200 177   Basic Metabolic Panel  Recent Labs  05/11/2017 2327 05/15/17 0530  NA 139 138  K 6.1* 5.3*  CL 103 105  CO2 24 24  GLUCOSE 142*  97  BUN 81* 76*  CREATININE 4.02* 4.12*  CALCIUM 8.6* 8.0*  MG 2.6* 2.8*  PHOS 3.6 3.2   Liver Function Tests  Recent Labs  04/22/2017 2327  AST 20  ALT 12*  ALKPHOS 86  BILITOT 0.9  PROT 5.4*  ALBUMIN 2.2*   No results for input(s): LIPASE, AMYLASE in the last 72 hours. Cardiac Enzymes  Recent Labs  04/19/2017 2327 05/15/17 0530  TROPONINI 0.06* 0.06*   BNP Invalid input(s): POCBNP D-Dimer No results for input(s): DDIMER in the last 72 hours. Hemoglobin A1C No results for input(s): HGBA1C in the last 72 hours. Fasting Lipid Panel No results for input(s): CHOL, HDL, LDLCALC, TRIG, CHOLHDL, LDLDIRECT in the last 72 hours. Thyroid Function Tests No results for input(s): TSH, T4TOTAL, T3FREE, THYROIDAB in the last 72 hours.  Invalid input(s): FREET3  Telemetry    Afib, 90s bpm currently, went into Afib with RVR overnight with heart rates into the 140s bpm - Personally Reviewed  ECG    n/a - Personally Reviewed  Radiology    Dg Chest 1 View  Result Date: 05/11/2017 IMPRESSION: Aortic atherosclerosis. Stable mild diffuse interstitial densities throughout both lungs most consistent with scarring, although superimposed edema or inflammation cannot be excluded. Minimal bilateral pleural effusions. Electronically Signed   By: Lupita Raider, M.D.   On: 05/07/2017 12:14   Dg Chest Port 1 View  Result Date: 05/15/2017 IMPRESSION: Persistent changes of congestive heart failure. No airspace consolidation. There is aortic atherosclerosis as well as calcification in both carotid arteries. Aortic Atherosclerosis (ICD10-I70.0). Electronically Signed   By: Bretta Bang III M.D.   On: 05/15/2017 07:05   Dg Chest Port 1 View  Result Date: 05/04/2017  IMPRESSION: 1. Prior CABG. Cardiomegaly with bilateral increased pulmonary interstitial prominence and bilateral pleural effusions most consistent with CHF. 2. No pneumothorax . Electronically Signed   By: Maisie Fus  Register    On: 04/29/2017 14:41   US Thoracentesis Asp Pleural Space W/img Guide  Result Date: 04/28/2017 INDICATION: Pleural effusions, larger on the right EXAM: ULTRASOUND GUIDED RIGHT THORACENTESIS MEDICATIONS: 1% lidocaine local COMPLICATIONS: None immediate. PROCEDURE: An ultrasound guided thoracentesis was thoroughly discussed with the patient and questions answered. The benefits, risks, alternatives and complications were also discussed. The patient understands and wishes to proceed with the procedure. Written consent was obtained. Ultrasound was performed to localize and mark an adequate pocket of fluid in the right chest. The area was then prepped and draped in the normal sterile fashion. 1% Lidocaine was used for local anesthesia. Under ultrasound guidance a 6 Fr Safe-T-Centesis  catheter was introduced. Thoracentesis was performed. The catheter was removed and a dressing applied. FINDINGS: A total of approximately 850 cc of clear serosanguineous fluid was removed. Samples were sent to the laboratory as requested by the clinical team. IMPRESSION: Successful ultrasound guided right thoracentesis yielding 850 cc of pleural fluid. Electronically Signed   By: Judie PetitM.  Shick M.D.   On: 04/25/2017 11:33    Cardiac Studies   TTE 2017/05/29: Study Conclusions  - Left ventricle: The cavity size was normal. There was mild   concentric hypertrophy. Systolic function was moderately reduced.   The estimated ejection fraction was in the range of 35% to 40%.   The study was not technically sufficient to allow evaluation of   LV diastolic dysfunction due to atrial fibrillation. - Aortic valve: A bioprosthesis was present and functioning   normally. - Mitral valve: A mitral valve clip was present. The findings are   consistent with mild stenosis. There was moderate regurgitation.   Mean gradient (D): 6 mm Hg. - Left atrium: The atrium was moderately to severely dilated. - Right atrium: The atrium was mildly dilated. -  Tricuspid valve: There was moderate regurgitation. - Pulmonic valve: There was moderate regurgitation. - Pulmonary arteries: Systolic pressure was mildly to moderately   increased. PA peak pressure: 48 mm Hg (S).  Patient Profile     83 y.o. female with history of coronary artery disease status post CABG, valvular heart disease status post aortic valve replacement with bioprosthetic valve and mitral valve clinic for severe regurgitation, chronic atrial fibrillation and multiple other chronic medical conditions who presented after a fall and was found to have hip fracture.  Assessment & Plan    1. Chronic Afib with RVR: -Redeveloped RVR on the evening of 7/30 (postop day #3) -Transferred to ICU -Placed on amiodarone gtt -Soft BP has precluded BB and ARF has precluded usage of digoxin -Currently with improved heart rates in the 90s bpm -Change IV amiodarone to PO amiodarone 200 mg bid x 5-7 days then 200 mg daily thereafter -Soft BP into the 90s systolic precludes metoprolol this morning -ARF continues to preclude digoxin -Not an candidate for CCB given hypotension and cardiomyopathy -Overall, a difficult situation given all of the above -INR improving s/p vitamin K -Coumadin per pharmacy -CHADSVASc at least 5 (CHF, age x 2, vascular disease, female) -Likely not a candidate for rhythm control  2. Acute on chronic systolic CHF: -Volume managed by HD currently -BP precludes BB, Imdur/hydralazine  -BP and ARF preclude ACEi/ARB/ANRI -Start evidence based HF medications when able -Diuresis per Renal  3. Elevated troponin: -Likely supply demand ischemia -No chest pain -Conservative management  4. ARF: -HD per Renal -Permcath placement on hold given INR  5. Hip fracture: -Status post left hemiarthroplasty   Signed, Eula Listenyan Dunn, PA-C [facility] Pager: [phone] 05/15/2017, 8:17 AM

## 2017-05-15 NOTE — Progress Notes (Addendum)
OT Cancellation Note  Patient Details Name: Cindy Weaver MRN: [id number] DOB: 03/13/1926   Cancelled Treatment:    Reason Eval/Treat Not Completed: Patient at procedure or test/ unavailable. New OT orders received. Pt receiving dialysis at this time. Will re-attempt OT re-evaluation next date in the morning as pt is available/appropriate.  Richrd PrimeJamie Stiller, MPH, MS, OTR/L ascom [phone] 05/15/17, 2:22 PM

## 2017-05-16 ENCOUNTER — Inpatient Hospital Stay: Payer: Medicare Other

## 2017-05-16 ENCOUNTER — Ambulatory Visit: Payer: Medicare Other | Admitting: Family

## 2017-05-16 DIAGNOSIS — I959 Hypotension, unspecified: Secondary | ICD-10-CM

## 2017-05-16 LAB — CBC
HCT: 27.5 % — ABNORMAL LOW (ref 35.0–47.0)
HEMATOCRIT: 26.7 % — AB (ref 35.0–47.0)
HEMOGLOBIN: 9.1 g/dL — AB (ref 12.0–16.0)
Hemoglobin: 8.7 g/dL — ABNORMAL LOW (ref 12.0–16.0)
MCH: 29.7 pg (ref 26.0–34.0)
MCH: 30.2 pg (ref 26.0–34.0)
MCHC: 33 g/dL (ref 32.0–36.0)
MCHC: 32.5 g/dL (ref 32.0–36.0)
MCV: 90 fL (ref 80.0–100.0)
MCV: 92.7 fL (ref 80.0–100.0)
Platelets: 183 10*3/uL (ref 150–440)
Platelets: 155 10*3/uL (ref 150–440)
RBC: 3.05 MIL/uL — AB (ref 3.80–5.20)
RBC: 2.88 MIL/uL — ABNORMAL LOW (ref 3.80–5.20)
RDW: 17.6 % — ABNORMAL HIGH (ref 11.5–14.5)
RDW: 18.3 % — AB (ref 11.5–14.5)
WBC: 18.5 10*3/uL — ABNORMAL HIGH (ref 3.6–11.0)
WBC: 17.7 10*3/uL — AB (ref 3.6–11.0)

## 2017-05-16 LAB — TYPE AND SCREEN
ABO/RH(D): O POS
Antibody Screen: NEGATIVE

## 2017-05-16 LAB — BASIC METABOLIC PANEL
ANION GAP: 8 (ref 5–15)
BUN: 62 mg/dL — ABNORMAL HIGH (ref 6–20)
CALCIUM: 8.5 mg/dL — AB (ref 8.9–10.3)
CHLORIDE: 104 mmol/L (ref 101–111)
CO2: 27 mmol/L (ref 22–32)
Creatinine, Ser: 3.32 mg/dL — ABNORMAL HIGH (ref 0.44–1.00)
GFR calc non Af Amer: 11 mL/min — ABNORMAL LOW (ref >60)
GFR, EST AFRICAN AMERICAN: 13 mL/min — AB (ref >60)
Glucose, Bld: 92 mg/dL (ref 65–99)
Potassium: 5 mmol/L (ref 3.5–5.1)
Sodium: 139 mmol/L (ref 135–145)

## 2017-05-16 LAB — PROTIME-INR
INR: 2.26
Prothrombin Time: 25.3 seconds — ABNORMAL HIGH (ref 11.4–15.2)

## 2017-05-16 LAB — PROCALCITONIN: Procalcitonin: 1.02 ng/mL

## 2017-05-16 MED ORDER — OXYCODONE HCL 5 MG PO TABS
5.0000 mg | ORAL_TABLET | ORAL | Status: DC | PRN
  Administered 2017-05-16 – 2017-05-17 (×2): 5 mg via ORAL
  Filled 2017-05-16 (×2): qty 1

## 2017-05-16 MED ORDER — SODIUM CHLORIDE 0.9 % IV SOLN
0.0000 ug/min | INTRAVENOUS | Status: DC
  Administered 2017-05-16 (×2): 50 ug/min via INTRAVENOUS
  Administered 2017-05-16: 20 ug/min via INTRAVENOUS
  Administered 2017-05-16 – 2017-05-17 (×3): 50 ug/min via INTRAVENOUS
  Filled 2017-05-16 (×6): qty 1

## 2017-05-16 NOTE — Care Management (Signed)
RNCM sent message to Dr. Ernest [facility]Hooten [facility] and that ICU RN can update when he is available.

## 2017-05-16 NOTE — Discharge Instructions (Signed)
Heart Failure Clinic appointment on May 30 2017 at 10:20am with Ambria Mayfield, FNP. Please call [phone] to reschedule.  °

## 2017-05-16 NOTE — Progress Notes (Signed)
Daily Progress Note   Patient Name: Cindy Weaver       Date: 05/16/2017 DOB: May 14, 1926  Age: 83 y.o. MRN#: [id number] Attending Physician: Demetrios Loll, MD Primary Care Physician: [place] Date: 04/17/2017  Reason for Consultation/Follow-up: Establishing goals of care  Subjective: Ms. Thaker does not appear any different today. She continues to be severely debilitated with multiple complications.    Length of Stay: 14  Current Medications: Scheduled Meds:  . albuterol  2.5 mg Nebulization TID  . amiodarone  200 mg Oral Daily  . budesonide (PULMICORT) nebulizer solution  0.5 mg Nebulization BID  . chlorhexidine  15 mL Mouth Rinse BID  . feeding supplement (ENSURE ENLIVE)  237 mL Oral TID BM  . ferrous sulfate  325 mg Oral Q breakfast  . mouth rinse  15 mL Mouth Rinse q12n4p  . multivitamin with minerals  1 tablet Oral Daily  . pantoprazole  40 mg Oral Daily  . phytonadione  0.2 mg Oral Daily  . polyethylene glycol  17 g Oral Daily  . senna-docusate  2 tablet Oral BID  . tiotropium  18 mcg Inhalation Daily  . Warfarin - Pharmacist Dosing Inpatient   Does not apply q1800    Continuous Infusions: . phenylephrine (NEO-SYNEPHRINE) Adult infusion 30 mcg/min (05/16/17 1336)    PRN Meds: acetaminophen **OR** acetaminophen, bisacodyl, guaiFENesin, magnesium hydroxide, [DISCONTINUED] metoCLOPramide **OR** metoCLOPramide (REGLAN) injection, [DISCONTINUED] ondansetron **OR** ondansetron (ZOFRAN) IV, oxyCODONE, pneumococcal 23 valent vaccine, traMADol  Physical Exam         Constitutional: She is oriented to person, place, and time. She appears well-developed.  Head: Normocephalic and atraumatic.  Cardiovascular: An irregularly irregular rhythm present. Regular  rate 90s present.   Pulmonary/Chest: Effort normal. No accessory muscle usage. No tachypnea. Mild-mod respiratory distress.  Productive cough, Rhonchi Abdominal: Soft. Normal appearance.  Neurological: She is alert and oriented to person, place, and time.  She is tired and restless  Nursing note and vitals reviewed.  Vital Signs: BP (!) 87/51   Pulse (!) 104   Temp 98.5 F (36.9 C) (Oral)   Resp 14   Ht 5' 8"  (1.727 m)   Wt 74.4 kg (164 lb 0.4 oz)   SpO2 93%   BMI 24.94 kg/m  SpO2: SpO2: 93 % O2 Device: O2  Device: Nasal Cannula O2 Flow Rate: O2 Flow Rate (L/min): 4 L/min  Intake/output summary:   Intake/Output Summary (Last 24 hours) at 05/16/17 1342 Last data filed at 05/16/17 1200  Gross per 24 hour  Intake           251.25 ml  Output             2223 ml  Net         -1971.75 ml   LBM: Last BM Date: 05/15/17 Baseline Weight: Weight: 56.2 kg (124 lb) Most recent weight: Weight: 74.4 kg (164 lb 0.4 oz)       Palliative Assessment/Data: 20%    Flowsheet Rows     Most Recent Value  Intake Tab  Referral Department  Hospitalist  Unit at Time of Referral  Cardiac/Telemetry Unit  Palliative Care Primary Diagnosis  Cardiac  Date Notified  04/19/2017  Palliative Care Type  New Palliative care  Reason for referral  Clarify Goals of Care  Date of Admission  04/30/2017  # of days IP prior to Palliative referral  5  Clinical Assessment  Psychosocial & Spiritual Assessment  Palliative Care Outcomes      Patient Active Problem List   Diagnosis Date Noted  . Closed nondisplaced intertrochanteric fracture of left femur ([place])   . Pressure injury of skin 05/13/2017  . Cough   . Non-intractable vomiting with nausea   . Acute renal failure (ARF) ([place])   . Goals of care, counseling/discussion   . Palliative care encounter   . Malnutrition of moderate degree 05/04/2017  . Atrial fibrillation with RVR ([place]) 04/17/2017  . Hip fracture ([place]) 04/22/2017  . Acute on chronic  systolic CHF (congestive heart failure) ([place]) 05/06/2017  . Acute on chronic respiratory failure with hypoxia ([place]) 04/20/2017  . Status post aortic valve replacement 04/15/2017  . Status post mitral valve repair 04/17/2017  . AKI (acute kidney injury) ([place]) 04/22/2017  . History of Coumadin therapy 04/18/2017  . Elevated troponin 05/05/2017  . Hypotension 05/10/2017  . A-fib ([place]) 06/17/2015  . Dental disease 06/17/2015  . MI (mitral incompetence) 04/25/2015  . Bursitis of knee 10/19/2014  . Anemia 02/18/2014  . Positional vertigo 12/15/2013  . CCF (congestive cardiac failure) ([place]) 05/22/2008    Palliative Care Assessment & Plan   HPI: 83 y.o. female  with past medical history of systolic CHF (EF 52%) on 2L at home, CAD, atrial fibrillation, s/p aortic valve replacement, s/p mitral valve clipping for severe mitral regurgitation admitted on 05/03/2017 with fall with left hip fracture. Hospital stay complicated by atrial fibrillation and heart failure which has delayed surgery to repair hip fracture. Atrial fibrillation controlled and now with worsening renal failure and beginning trial of dialysis 05/08/17.    Assessment: I met again today with Ms. Badger and her daughter caught me in the hallway prior. Family is always at bedside when I come by. I spoke with her daughter and again reiterated my concerns with her weakened status and her multiple complications that continue to cause issues. We discussed dialysis and I asked if anyone has spoken with Ms. Outten if she would want to do dialysis long term if indicated - she replies "well she'll have too, she doesn't have a choice." She is happy that she will have PT today and believe this will help lift her mother's spirits and make her feel a little better if she is able to get out of bed.   I spoke more with Ms. Carew.  She is fatigued and weak but smiles at me. Very poor appetite and she could only tolerate 1/4-1/3 cup of ice cream. A  family/friend at bedside mentions feeding tube. Her cough is still there but MUCH improved after dialysis yesterday.   I have tried to press on patient and family how critically ill Ms. Ginzburg is and that we are not really gaining much ground here. She finally had her hip repaired but continues to struggle with need for dialysis, atrial fibrillation, hypotension, and at this point just severe weakness and intake. Difficult to have a conversation with Ms. Miyasato as she feels so terrible and family always at bedside pressing for full aggressive care.   Recommendations/Plan:  Wet/congested cough: Improved after dialysis.    Nausea: Continue Zofran prn. Reglan prn. Much improved.   Insomnia: If not improved with treatment of other symptoms may consider trazodone 25 mg qhs. This has also improved.   Goals of Care and Additional Recommendations:  Limitations on Scope of Treatment: Full Scope Treatment  Code Status:  Full code  Prognosis:   Unable to determine but poor given multiple severe comorbitities   Discharge Planning:  To Be Determined   Thank you for allowing the Palliative Medicine Team to assist in the care of this patient.   Total Time 74mn Prolonged Time Billed  no       Greater than 50%  of this time was spent counseling and coordinating care related to the above assessment and plan.  AVinie Sill NP Palliative Medicine Team Pager # [phone](M-F 8a-5p) Team Phone # [phone](Nights/Weekends)

## 2017-05-16 NOTE — Progress Notes (Signed)
[facility]  ROUNDING NOTE   Subjective:   Left hip surgery 7/27  Underwent RIght thoracentesis on 7/30; . 850 cc of clear fluid was removed   HD done on 7/31. UOP 1300 cc. Currently requiring [place] oxygen. Still has a wet sounding cough with clear sputum Still has dependent edema  Objective:  Vital signs in last 24 hours:  Temp:  [97.3 F (36.3 C)-98.6 F (37 C)] 98.6 F (37 C) (08/01 0000) Pulse Rate:  [86-117] 96 (08/01 0600) Resp:  [13-22] 18 (08/01 0600) BP: (80-117)/(44-92) 102/53 (08/01 0600) SpO2:  [90 %-100 %] 99 % (08/01 0737) Weight:  [71.4 kg (157 lb 6.5 oz)-74.4 kg (164 lb 0.4 oz)] 74.4 kg (164 lb 0.4 oz) (08/01 0500)   Weight change: 0.4 kg (14.1 oz) Filed Weights   05/15/17 1346 05/15/17 1556 05/16/17 0500  Weight: 72.8 kg (160 lb 7.9 oz) 71.4 kg (157 lb 6.5 oz) 74.4 kg (164 lb 0.4 oz)    Intake/Output: I/O last 3 completed shifts: In: 504.5 [I.V.:504.5] Out: 2168 [Urine:325; Drains:480; Other:1363]   Intake/Output this shift:  No intake/output data recorded.  Physical Exam: General: Critically ill appearing  Head: Moist oral mucosal membranes  Eyes: Anicteric,    Neck: Supple,   Lungs:  Rhonchi b/l.   Heart: irregular  Abdomen:  Soft, nontender  Extremities: ++ dependent peripheral edema. Left hip wound vac  Neurologic: Able to answer few questions, alert and oreinted  Skin: No acute lesions  Access: Right femoral temp HD cathter Dr. Lucky Cowboy 1/61    Basic Metabolic Panel:  Recent Labs Lab 05/10/17 0306  05/12/17 0441 05/13/17 0252 05/08/2017 0400 04/15/2017 2327 05/15/17 0530  NA 137  < > 139 137 137 139 138  K 3.9  < > 4.5 4.5 4.8 6.1* 5.3*  CL 100*  < > 103 102 102 103 105  CO2 28  < > _0 GLUCOSE 99  < > 77 104* 98 142* 97  BUN 46*  < > 42* 58* 74* 81* 76*  CREATININE 2.48*  < > 2.59* 3.20* 3.68* 4.02* 4.12*  CALCIUM 8.0*  < > 8.2* 8.2* 8.2* 8.6* 8.0*  MG  --   --  2.0  --   --  2.6* 2.8*  PHOS 2.2*  --  3.5  --    --  3.6 3.2  < > = values in this interval not displayed.  Liver Function Tests:  Recent Labs Lab 05/10/17 0306 04/22/2017 2327  AST  --  20  ALT  --  12*  ALKPHOS  --  86  BILITOT  --  0.9  PROT  --  5.4*  ALBUMIN 2.1* 2.2*   No results for input(s): LIPASE, AMYLASE in the last 168 hours. No results for input(s): AMMONIA in the last 168 hours.  CBC:  Recent Labs Lab 05/13/17 0252 04/23/2017 0400 05/01/2017 2327 05/15/17 0530 05/16/17 0512  WBC 19.3* 20.4* 19.6* 16.8* 18.5*  HGB 9.8* 9.1* 9.5* 9.4* 9.1*  HCT 29.7* 27.5* 28.5* 28.2* 27.5*  MCV 90.7 89.9 89.4 90.3 90.0  PLT 186 190 200 177 183    Cardiac Enzymes:  Recent Labs Lab 04/19/2017 2327 05/15/17 0530  TROPONINI 0.06* 0.06*    BNP: Invalid input(s): POCBNP  CBG:  Recent Labs Lab 05/13/2017 1638 05/12/17 1614 05/15/17 0037  GLUCAP 12 117* 136*    Microbiology: Results for orders placed or performed during the hospital encounter of 05/10/2017  MRSA PCR Screening  Status: None   Collection Time: 05/04/17  8:27 AM  Result Value Ref Range Status   MRSA by PCR NEGATIVE NEGATIVE Final    Comment:        The GeneXpert MRSA Assay (FDA approved for NASAL specimens only), is one component of a comprehensive MRSA colonization surveillance program. It is not intended to diagnose MRSA infection nor to guide or monitor treatment for MRSA infections.   Culture, expectorated sputum-assessment     Status: None   Collection Time: 05/05/17 10:55 AM  Result Value Ref Range Status   Specimen Description SPUTUM  Final   Special Requests Normal  Final   Sputum evaluation THIS SPECIMEN IS ACCEPTABLE FOR SPUTUM CULTURE  Final   Report Status 05/05/2017 FINAL  Final  Culture, respiratory (NON-Expectorated)     Status: None   Collection Time: 05/05/17 10:55 AM  Result Value Ref Range Status   Specimen Description SPUTUM  Final   Special Requests Normal Reflexed from [id number]  Final   Gram Stain   Final    FEW WBC  PRESENT, PREDOMINANTLY PMN FEW GRAM NEGATIVE RODS FEW GRAM POSITIVE COCCI IN PAIRS FEW GRAM POSITIVE RODS RARE BUDDING YEAST SEEN    Culture   Final    Consistent with normal respiratory flora. Performed at [facility], [place] [address], [place], [place] [address]    Report Status 04/22/2017 FINAL  Final  Culture, body fluid-bottle     Status: None (Preliminary result)   Collection Time: 04/21/2017 11:25 AM  Result Value Ref Range Status   Specimen Description FLUID PLEURAL  Final   Special Requests BOTTLES DRAWN AEROBIC ONLY  Final   Culture   Final    NO GROWTH < 24 HOURS Performed at [facility], [place] [address], [place], [place] [address]    Report Status PENDING  Incomplete  Gram stain     Status: None   Collection Time: 05/04/2017 11:25 AM  Result Value Ref Range Status   Specimen Description FLUID PLEURAL  Final   Special Requests NONE  Final   Gram Stain   Final    CYTOSPIN SMEAR WBC PRESENT,BOTH PMN AND MONONUCLEAR NO ORGANISMS SEEN    Report Status 05/15/2017 FINAL  Final    Coagulation Studies:  Recent Labs  04/30/2017 0400 04/15/2017 1706 05/06/2017 2327 05/15/17 0530 05/16/17 0512  LABPROT 47.2* 43.3* 38.5* 33.2* 25.3*  INR 4.92* 4.41* 3.81 3.17 2.26    Urinalysis: No results for input(s): COLORURINE, LABSPEC, PHURINE, GLUCOSEU, HGBUR, BILIRUBINUR, KETONESUR, PROTEINUR, UROBILINOGEN, NITRITE, LEUKOCYTESUR in the last 72 hours.  Invalid input(s): APPERANCEUR    Imaging: Dg Chest 1 View  Result Date: 04/30/2017 CLINICAL DATA:  Right pleural effusion. EXAM: CHEST 1 VIEW COMPARISON:  Radiograph of May 13, 2017. FINDINGS: Stable cardiomegaly. Atherosclerosis of thoracic aorta is noted. Stable mild diffuse interstitial densities are noted throughout both lungs most consistent with scarring, although superimposed edema or inflammation cannot be excluded. No pneumothorax is noted. Minimal bilateral pleural effusions are noted. Sternotomy wires are noted.  IMPRESSION: Aortic atherosclerosis. Stable mild diffuse interstitial densities throughout both lungs most consistent with scarring, although superimposed edema or inflammation cannot be excluded. Minimal bilateral pleural effusions. Electronically Signed   By: Marijo Conception, M.D.   On: 05/13/2017 12:14   Dg Chest Port 1 View  Result Date: 05/16/2017 CLINICAL DATA:  Respiratory failure. EXAM: PORTABLE CHEST 1 VIEW COMPARISON:  05/15/2017. FINDINGS: Prior CABG and cardiac valve replacement. Small metallic density noted over  the lower mid chest is unchanged in position. Stable cardiomegaly. Persistent but slightly improved bilateral pulmonary interstitial prominence. Bilateral pleural effusions are stable. No pneumothorax. IMPRESSION: Prior CABG and cardiac valve replacement. Congestive heart failure with interstitial edema, slightly improved from prior exam. Stable bilateral pleural effusions. Electronically Signed   By: Marcello Moores  Register   On: 05/16/2017 06:33   Dg Chest Port 1 View  Result Date: 05/15/2017 CLINICAL DATA:  Chronic renal failure.  Congestive heart failure EXAM: PORTABLE CHEST 1 VIEW COMPARISON:  May 14, 2017 FINDINGS: There is diffuse interstitial pulmonary edema with small pleural effusions bilaterally. There is no appreciable airspace consolidation. There is cardiomegaly with pulmonary venous hypertension. There is aortic atherosclerosis. Patient is status post median sternotomy. No adenopathy. Bones are osteoporotic. There is calcification in both carotid arteries. IMPRESSION: Persistent changes of congestive heart failure. No airspace consolidation. There is aortic atherosclerosis as well as calcification in both carotid arteries. Aortic Atherosclerosis (ICD10-I70.0). Electronically Signed   By: Lowella Grip III M.D.   On: 05/15/2017 07:05   Dg Chest Port 1 View  Result Date: 05/03/2017 CLINICAL DATA:  Left hip hemiarthroplasty. Postoperative day 3. Prior thoracentesis. EXAM:  PORTABLE CHEST 1 VIEW COMPARISON:  Ultrasound 05/08/2017. Chest x-ray 04/21/2017, 05/13/2017. FINDINGS: Prior CABG. Cardiomegaly with slight increase pulmonary interstitial prominence and bilateral pleural effusions. Findings suggest congestive heart failure. No pneumothorax IMPRESSION: 1. Prior CABG. Cardiomegaly with bilateral increased pulmonary interstitial prominence and bilateral pleural effusions most consistent with CHF. 2. No pneumothorax . Electronically Signed   By: Marcello Moores  Register   On: 04/16/2017 14:41   US Thoracentesis Asp Pleural Space W/img Guide  Result Date: 05/05/2017 INDICATION: Pleural effusions, larger on the right EXAM: ULTRASOUND GUIDED RIGHT THORACENTESIS MEDICATIONS: 1% lidocaine local COMPLICATIONS: None immediate. PROCEDURE: An ultrasound guided thoracentesis was thoroughly discussed with the patient and questions answered. The benefits, risks, alternatives and complications were also discussed. The patient understands and wishes to proceed with the procedure. Written consent was obtained. Ultrasound was performed to localize and mark an adequate pocket of fluid in the right chest. The area was then prepped and draped in the normal sterile fashion. 1% Lidocaine was used for local anesthesia. Under ultrasound guidance a 6 Fr Safe-T-Centesis catheter was introduced. Thoracentesis was performed. The catheter was removed and a dressing applied. FINDINGS: A total of approximately 850 cc of clear serosanguineous fluid was removed. Samples were sent to the laboratory as requested by the clinical team. IMPRESSION: Successful ultrasound guided right thoracentesis yielding 850 cc of pleural fluid. Electronically Signed   By: Jerilynn Mages.  Shick M.D.   On: 05/04/2017 11:33     Medications:   . phenylephrine (NEO-SYNEPHRINE) Adult infusion Stopped (05/16/17 0700)   . albuterol  2.5 mg Nebulization TID  . amiodarone  200 mg Oral Daily  . budesonide (PULMICORT) nebulizer solution  0.5 mg  Nebulization BID  . chlorhexidine  15 mL Mouth Rinse BID  . feeding supplement (ENSURE ENLIVE)  237 mL Oral TID BM  . ferrous sulfate  325 mg Oral Q breakfast  . mouth rinse  15 mL Mouth Rinse q12n4p  . multivitamin with minerals  1 tablet Oral Daily  . pantoprazole  40 mg Oral Daily  . phytonadione  0.2 mg Oral Daily  . polyethylene glycol  17 g Oral Daily  . predniSONE  20 mg Oral Q breakfast  . senna-docusate  2 tablet Oral BID  . tiotropium  18 mcg Inhalation Daily  . Warfarin - Pharmacist Dosing Inpatient  Does not apply q1800   acetaminophen **OR** acetaminophen, bisacodyl, guaiFENesin, magnesium hydroxide, menthol-cetylpyridinium **OR** phenol, [DISCONTINUED] metoCLOPramide **OR** metoCLOPramide (REGLAN) injection, morphine injection, [DISCONTINUED] ondansetron **OR** ondansetron (ZOFRAN) IV, oxyCODONE, pneumococcal 23 valent vaccine, traMADol  Assessment/ Plan:  Ms. Cindy Weaver is a 83 y.o. black female with Chronic kidney disease stage III baseline EGFR 52, congestive heart failure, hyperlipidemia, disease status post CABG, anemia of chronic kidney disease, chronic atrial fibrillation, severe mitral regurgitation, hypertension, GERD, COPD,   Hospital course, patient was admitted to [facility] on 04/30/2017 for evaluation of Fall with left hip fracture and also had Atrial fibrillation with RVR and acute renal failure. Hip surgery was postponed until 7/27 due to coagulopathy with elevated INR. Taken off warfarin, restarted after surgery. Now on heparin gtt.   1. Acute renal failure on chronic kidney disease stage III: baseline creatinine 1.18, GFR of 52 from 04/01/17 Required Hemodialysis treatment on 7/24, 7/26, 7/31 Had some U OP but no foley so not quantified. BP is low normal - Breathing status has improved. Will remove dialysis catheter to allow for mobility - depending on labs and resp status, if she needs more dialysis prefer IJ permcath  Or IJ temp dialysis cathter    2.   Hypotension with atrial fibrillation with rapid ventricular response:  - limiting volume removal with HD - on amiodarone  3. LE edema -  3rd spacing from low albumin  4. INR 3.17 - consider holding warfarin until permcath is placed or patient is off of dialysis     LOS: 14 Alpha Mysliwiec 8/1/20189:06 AM

## 2017-05-16 NOTE — Evaluation (Signed)
Physical Therapy Re-Evaluation Patient Details Name: Cindy Weaver MRN: [id number] DOB: 09/10/1926 Today's Date: 05/16/2017   History of Present Illness  Pt is an 83 yo female, s/p L hip hemiarthroplasty 7/27 with complicated hospital stay. Pt transported to ED on 7/18 after fall at home (sustained transverse fx L femoral neck), admitted to telemetry to help make pt more stable for surgery (d/t a-fib with RVR and acute on chronic CHF).  Right temporary femoral cath inserted on 7/23 and hemodialysis initiated on 7/24 d/t acute renal failure.  S/p 1 unit PRBC's transfusion 7/26.  L hip hemiarthroplasty performed 7/27 with pt transfered to ICU following surgery due to hypoxia (imaging showing enlarging R pleural effusion and unchanged small L pleural effusion).  Pt s/p thoracentesis 04/29/2017.  Pt then transferred to CCU 04/30/2017 d/t a-fib with RVR and hypotensive.  New PT order received after transfer to CCU.  R femoral catheter removed 05/16/17.   PMHx significant for CAD s/p CABG, A-fib, chronic renal failure, on 2L O2 at home, bioprosthetic aortic valve replacement surgery, systolic CHF with EF 45%.  Clinical Impression  PT received new therapy consult after pt transferred to CCU and pt seen for PT re-evaluation.  R temporary femoral cath removed this morning and pt able to be mobilized now.  Currently pt is mod assist x2 supine to/from sit.  Pt close SBA sitting edge of bed x5 minutes.  Pt's BP supine in bed (upon therapy entering room) was 77/54; after bed level ex's increased to 96/59; after sitting edge of bed was initially 91/72 but after 5 minutes sitting edge of bed pt's systolic decreased to 77 (pt noted to be mildly shaky but no c/o dizziness/lightheadedness); after pt assisted into supine in bed pt's BP 98/60.  Pt reports h/o low BP at baseline (pt currently on IV BP medications d/t hypotension concerns).   Pt's HR 102-119 bpm and O2 98% or greater on 4 L O2 via nasal cannula during session.  Impaired  strength and activity tolerance noted during session.  Currently will continue with QD therapy sessions (d/t clinical/medical presentation and impaired activity tolerance) but will continue to monitor for appropriateness to increase to BID frequency.  Pt would benefit from skilled PT to address noted impairments and functional limitations (see below for any additional details).  Upon hospital discharge, recommend pt discharge to STR.    Follow Up Recommendations SNF    Equipment Recommendations  Rolling walker with 5" wheels    Recommendations for Other Services       Precautions / Restrictions Precautions Precautions: Fall;Posterior Hip Precaution Comments: L hip wound vac Restrictions Weight Bearing Restrictions: Yes LLE Weight Bearing: Weight bearing as tolerated      Mobility  Bed Mobility Overal bed mobility: Needs Assistance Bed Mobility: Supine to Sit;Sit to Supine     Supine to sit: Mod assist;+2 for physical assistance Sit to supine: Mod assist;+2 for physical assistance   General bed mobility comments: mod assist x2 for assist of trunk and B LE's; vc's and assist for posterior hip precautions  Transfers                 General transfer comment: deferred d/t decrease in BP once sitting EOB (not safe to attempt)  Ambulation/Gait             General Gait Details: deferred d/t decrease in BP once sitting EOB (not safe to attempt)  Art therapisttairs            Wheelchair  Mobility    Modified Rankin (Stroke Patients Only)       Balance Overall balance assessment: Needs assistance Sitting-balance support: Bilateral upper extremity supported;Feet supported Sitting balance-Leahy Scale: Fair Sitting balance - Comments: SBA static sitting balance                                     Pertinent Vitals/Pain Pain Assessment: No/denies pain Pain Intervention(s): Limited activity within patient's tolerance;Monitored during session;Premedicated  before session    Home Living Family/patient expects to be discharged to:: Private residence Living Arrangements: Non-relatives/Friends Available Help at Discharge: Family;Available 24 hours/day Type of Home: House Home Access: Stairs to enter   [employer] CorporationEntrance Stairs-Number of Steps: 2 steps from garage w/ R handrail (preferred), 2 steps from front of house with bilat handrails (unable to reach both rails at the same time) Home Layout: Two level Home Equipment: Shower seat - built in;Walker - 2 wheels;Cane - single point;Bedside commode;Grab bars - toilet;Grab bars - tub/shower;Hand held shower head;Wheelchair - manual Additional Comments: has suction grab bars that can be installed in shower (not currently in place)    Prior Function Level of Independence: Needs assistance   Gait / Transfers Assistance Needed: Pt endorses "furniture walking", no formal AD for mobility (holds onto somebody when ambulating in community)  ADL's / Homemaking Assistance Needed: Pt able to perform bathing, dressing, toileting independently; friend staying with pt assisted with med mgt, cooking, cleaning, groceries, and laundry  Comments: Pt endorses 2 falls in past 12 months both due to a loss of balance     Hand Dominance   Dominant Hand: Right    Extremity/Trunk Assessment   Upper Extremity Assessment Upper Extremity Assessment: Defer to OT evaluation    Lower Extremity Assessment Lower Extremity Assessment: RLE deficits/detail;LLE deficits/detail RLE Deficits / Details: hip flexion 3-/5; knee flexion 3-/5; knee extension 3+/5; DF at least 3+/5; DF ROM to neutral LLE Deficits / Details: hip flexion 2+/5; knee flexion/extension 3-/5; DF at least 3/5 AROM; DF ROM to neutral    Cervical / Trunk Assessment Cervical / Trunk Assessment: Kyphotic  Communication   Communication: No difficulties  Cognition Arousal/Alertness: Awake/alert Behavior During Therapy: WFL for tasks assessed/performed Overall  Cognitive Status: Within Functional Limits for tasks assessed                                        General Comments General comments (skin integrity, edema, etc.): R temporary femoral catheter removed this morning.  Dressing checked beginning and end of session: no blood or drainage noted.  Nursing cleared pt for participation in physical therapy.  Pt agreeable to PT session.    Exercises Total Joint Exercises Ankle Circles/Pumps: AROM;Strengthening;Both;10 reps;Supine Quad Sets: AROM;Strengthening;Both;10 reps;Supine Gluteal Sets: AROM;Both;10 reps;Supine Short Arc Quad: AAROM;Strengthening;Both;Supine Heel Slides: AAROM;Strengthening;Both;10 reps;Supine Hip ABduction/ADduction: AAROM;Strengthening;Both;10 reps;Supine  Vc's required for above ex's.   Assessment/Plan    PT Assessment Patient needs continued PT services  PT Problem List Decreased strength;Decreased range of motion;Decreased activity tolerance;Decreased balance;Decreased mobility;Decreased knowledge of use of DME;Decreased knowledge of precautions;Pain;Cardiopulmonary status limiting activity       PT Treatment Interventions DME instruction;Gait training;Stair training;Functional mobility training;Therapeutic activities;Therapeutic exercise;Balance training;Patient/family education    PT Goals (Current goals can be found in the Care Plan section)  Acute Rehab PT Goals Patient Stated Goal: get stronger  PT Goal Formulation: With patient Time For Goal Achievement: 05/30/17 Potential to Achieve Goals: Good    Frequency 7X/week   Barriers to discharge Decreased caregiver support      Co-evaluation PT/OT/SLP Co-Evaluation/Treatment: Yes Reason for Co-Treatment: For patient/therapist safety;To address functional/ADL transfers PT goals addressed during session: Mobility/safety with mobility;Strengthening/ROM OT goals addressed during session: ADL's and self-care       AM-PAC PT "6 Clicks" Daily  Activity  Outcome Measure Difficulty turning over in bed (including adjusting bedclothes, sheets and blankets)?: Total Difficulty moving from lying on back to sitting on the side of the bed? : Total Difficulty sitting down on and standing up from a chair with arms (e.g., wheelchair, bedside commode, etc,.)?: Total Help needed moving to and from a bed to chair (including a wheelchair)?: Total Help needed walking in hospital room?: Total Help needed climbing 3-5 steps with a railing? : Total 6 Click Score: 6    End of Session Equipment Utilized During Treatment: Oxygen (4 L O2 via nasal cannula) Activity Tolerance: Patient limited by fatigue;Treatment limited secondary to medical complications (Comment) (Limited d/t hypotension) Patient left: in bed;with call bell/phone within reach;with bed alarm set;with family/visitor present;with SCD's reapplied (B heels elevated via foam heel protectors) Nurse Communication: Mobility status;Precautions;Weight bearing status;Other (comment) (Pt's BP during session) PT Visit Diagnosis: Muscle weakness (generalized) (M62.81);Other abnormalities of gait and mobility (R26.89);History of falling (Z91.81);Pain Pain - Right/Left: Left Pain - part of body: Hip    Time: 8119-1478 PT Time Calculation (min) (ACUTE ONLY): 41 min   Charges:   PT Evaluation $PT Re-evaluation: 1 Re-eval PT Treatments $Therapeutic Exercise: 8-22 mins   PT G CodesHendricks Limes, PT 05/16/17, 5:56 PM [phone]

## 2017-05-16 NOTE — Progress Notes (Signed)
ANTICOAGULATION CONSULT NOTE - Initial Consult  Pharmacy Consult for warfarin Indication: VTE prophylaxis and warfarin  Allergies  Allergen Reactions  . Penicillins Itching and Rash    Other reaction(s): RASH  . Enalapril Other (See Comments)    cough  . Statins Other (See Comments)    Other reaction(s): MUSCLE PAIN    Patient Measurements: Height: 5\' 8"  (172.7 cm) Weight: 164 lb 0.4 oz (74.4 kg) IBW/kg (Calculated) : 63.9  Vital Signs: Temp: 98.5 F (36.9 C) (08/01 1200) Temp Source: Oral (08/01 1200) BP: 75/51 (08/01 1400) Pulse Rate: 110 (08/01 1400)  Labs:  Recent Labs  05/12/2017 2327 05/15/17 0530 05/16/17 0512 05/16/17 1116  HGB 9.5* 9.4* 9.1* 8.7*  HCT 28.5* 28.2* 27.5* 26.7*  PLT 200 177 183 155  LABPROT 38.5* 33.2* 25.3*  --   INR 3.81 3.17 2.26  --   CREATININE 4.02* 4.12* 3.32*  --   TROPONINI 0.06* 0.06*  --   --     Estimated Creatinine Clearance: 11.8 mL/min (A) (by C-G formula based on SCr of 3.32 mg/dL (H)).   Medical History: Past Medical History:  Diagnosis Date  . A-fib (HCC)    a. on Coumadin; b. CHADS2VASc => 5 (CHF, age x 2, vascular disease, female)  . Chronic systolic CHF (congestive heart failure) (HCC)   . Coronary artery disease    a. s/p CABG w/ SVG-PRDA  . S/P mitral valve clip implantation   . Status post aortic valve replacement     Assessment: 83 y/o F with a h/o atrial fibrillation s/p hip fracture repair with hospital stay complicated by AKI requiring HD and atrial fibrillation with rapid ventricular response.   Temporary HD cath pulled today with excessive bleeding noted.  Goal of Therapy:  INR 2-3 Monitor platelets by anticoagulation protocol: Yes   Plan:  Will continue to hold Coumadin and f/u AM INR as well as plans for permcath placement.   Luisa HartScott Abiageal Blowe, PharmD Clinical Pharmacist   05/16/2017,2:07 PM

## 2017-05-16 NOTE — Evaluation (Signed)
Occupational Therapy Evaluation Patient Details Name: Cindy Weaver Ivins MRN: [id number] DOB: 04/22/1926 Today's Date: 05/16/2017    History of Present Illness Pt is an 83 yo female, s/p L hip hemiarthroplasty 7/27 with complicated hospital stay. Pt transported to ED on 7/18 after fall at home (sustained transverse fx L femoral neck), admitted to telemetry to help make pt more stable for surgery (d/t a-fib with RVR and acute on chronic CHF).  Right temporary femoral cath inserted on 7/23 and hemodialysis initiated on 7/24 d/t acute renal failure.  S/p 1 unit PRBC's transfusion 7/26.  L hip hemiarthroplasty performed 7/27 with pt transfered to ICU following surgery due to hypoxia (imaging showing enlarging R pleural effusion and unchanged small L pleural effusion).   PMHx significant for CAD s/p CABG, A-fib, chronic renal failure, on 2L O2 at home, bioprosthetic aortic valve replacement surgery, systolic CHF with EF 45%.   Clinical Impression   Pt seen for OT re-evaluation this date, cleared to see by RN. Pt was transferred back to ICU 7/31 due to low BP, with new OT orders received. Pt eager to work with therapy this date, reporting no pain, recently given pain medication prior to therapy arriving. Pt presents with deficits in strength, activity tolerance, and ROM with L hip with posterior total hip precautions. Temporary femoral catheter removed earlier today, enabling OT/PT to attempt functional mobility. At start of session BP 77/54, after performing bed level BLE exercises with PT BP 96/59. Pt able to perform supine<>sit with Mod assist +2 for physical assistance and to ensure maintaining posterior THPs. Once EOB, SBA for sitting balance for approx 5 minutes. BP 91/72 decreasing to 77 systolic and 50's diastolic, pt reporting no symptoms, but some very mild shakiness noted. Unsafe to attempt transfers at this time. Mod Assist x2 for sit>supine. BP increased to 98/60 once lying down. Pt will benefit from  skilled OT services to address noted impairments and functional deficits in order to maximize return to PLOF and minimize risk of further decline/increased caregiver burden/falls. Recommend STR following hospitalization once medically appropriate. Checked dressing from where R femoral catheter had been removed earlier this date, bandage clean with no sign of blood/drainage noted both before and after session.     Follow Up Recommendations  SNF    Equipment Recommendations  None recommended by OT    Recommendations for Other Services       Precautions / Restrictions Precautions Precautions: Fall;Posterior Hip Precaution Comments: R fem cath removed this date 8/1,L hip wound vac Restrictions Weight Bearing Restrictions: Yes LLE Weight Bearing: Weight bearing as tolerated      Mobility Bed Mobility Overal bed mobility: Needs Assistance Bed Mobility: Supine to Sit;Sit to Supine     Supine to sit: Mod assist;+2 for physical assistance Sit to supine: Mod assist;+2 for physical assistance   General bed mobility comments: mod assist x2 for BLE mgt and trunk mgt  Transfers                 General transfer comment: deferred d/t decrease in BP once sitting EOB, unable to attempt.     Balance Overall balance assessment: Needs assistance Sitting-balance support: Bilateral upper extremity supported;Feet supported Sitting balance-Leahy Scale: Fair Sitting balance - Comments: SBA for static sitting balance                                   ADL either performed or assessed  with clinical judgement   ADL Overall ADL's : Needs assistance/impaired Eating/Feeding: Bed level;Set up   Grooming: Bed level;Set up;Supervision/safety   Upper Body Bathing: Moderate assistance;Bed level   Lower Body Bathing: Maximal assistance;Bed level   Upper Body Dressing : Moderate assistance;Bed level   Lower Body Dressing: Bed level;Maximal assistance     Toilet Transfer  Details (indicate cue type and reason): deferred d/t low BP, unable to attempt           General ADL Comments: pt generally mod assist for UB ADL and max assist for LB ADL at this time due to weakness and poor activity tolerance     Vision Baseline Vision/History: Wears glasses Wears Glasses: Reading only Patient Visual Report: No change from baseline Vision Assessment?: No apparent visual deficits     Perception     Praxis      Pertinent Vitals/Pain Pain Assessment: No/denies pain Pain Intervention(s): Limited activity within patient's tolerance;Monitored during session;Premedicated before session     Hand Dominance Right   Extremity/Trunk Assessment Upper Extremity Assessment Upper Extremity Assessment: Generalized weakness (grossly 4-/5 bilaterally)   Lower Extremity Assessment Lower Extremity Assessment: Defer to PT evaluation;LLE deficits/detail   Cervical / Trunk Assessment Cervical / Trunk Assessment: Kyphotic   Communication Communication Communication: No difficulties   Cognition Arousal/Alertness: Awake/alert Behavior During Therapy: WFL for tasks assessed/performed Overall Cognitive Status: Within Functional Limits for tasks assessed                                     General Comments  R temporary femoral catheter removed this am, checked bandage at beginning and at end of session. No blood or drainage noted.     Exercises     Shoulder Instructions      Home Living Family/patient expects to be discharged to:: Private residence Living Arrangements: Non-relatives/Friends Available Help at Discharge: Family;Available 24 hours/day Type of Home: House Home Access: Stairs to enter [employer] of Steps: 2 steps from garage Weaver/ R handrail (preferred), 2 steps from front of house with bilat handrails (unable to reach both rails at the same time)   Home Layout: Two level Alternate Level Stairs-Number of Steps: bedrooms on 2nd  floor, chair lift for stairs   Bathroom Shower/Tub: Walk-in shower;Door   [employer] Toilet: Handicapped height Bathroom Accessibility: Yes How Accessible: Accessible via walker Home Equipment: Shower seat - built in;Walker - 2 wheels;Cane - single point;Bedside commode;Grab bars - toilet;Grab bars - tub/shower;Hand held shower head;Wheelchair - manual   Additional Comments: has suction grab bars that can be installed in shower (not currently in place)      Prior Functioning/Environment Level of Independence: Needs assistance  Gait / Transfers Assistance Needed: Pt endorses "furniture walking", no formal AD for mobility (holds onto somebody when ambulating in community) ADL's / Homemaking Assistance Needed: Pt able to perform bathing, dressing, toileting independently; friend staying with pt assisted with med mgt, cooking, cleaning, groceries, and laundry   Comments: Pt endorses 2 falls in past 12 months both due to a loss of balance        OT Problem List: Decreased strength;Decreased range of motion;Decreased activity tolerance;Impaired balance (sitting and/or standing);Decreased knowledge of use of DME or AE      OT Treatment/Interventions: Self-care/ADL training;Therapeutic exercise;Therapeutic activities;Energy conservation;DME and/or AE instruction;Patient/family education    OT Goals(Current goals can be found in the care plan section) Acute Rehab OT Goals  Patient Stated Goal: get stronger OT Goal Formulation: With patient Time For Goal Achievement: 05/30/17 Potential to Achieve Goals: Good  OT Frequency: Min 2X/week   Barriers to D/C: Inaccessible home environment;Decreased caregiver support          Co-evaluation PT/OT/SLP Co-Evaluation/Treatment: Yes Reason for Co-Treatment: For patient/therapist safety;To address functional/ADL transfers PT goals addressed during session: Mobility/safety with mobility OT goals addressed during session: ADL's and self-care       AM-PAC PT "6 Clicks" Daily Activity     Outcome Measure Help from another person eating meals?: A Little Help from another person taking care of personal grooming?: A Little Help from another person toileting, which includes using toliet, bedpan, or urinal?: A Lot Help from another person bathing (including washing, rinsing, drying)?: A Lot Help from another person to put on and taking off regular upper body clothing?: A Lot Help from another person to put on and taking off regular lower body clothing?: A Lot 6 Click Score: 14   End of Session Equipment Utilized During Treatment: Gait belt  Activity Tolerance: Patient tolerated treatment well Patient left: in bed;with call bell/phone within reach;with bed alarm set;with family/visitor present  OT Visit Diagnosis: Other abnormalities of gait and mobility (R26.89);Muscle weakness (generalized) (M62.81);History of falling (Z91.81)                Time: 5784-69621432-1513 OT Time Calculation (min): 41 min Charges:  OT General Charges $OT Visit: 1 Procedure OT Evaluation $OT Re-eval: 1 Procedure G-Codes:     Richrd PrimeJamie Stiller, MPH, MS, OTR/L ascom [phone] 05/16/17, 3:53 PM

## 2017-05-16 NOTE — Progress Notes (Signed)
Chaplain was making rounds and visited with pt in room ICU 19. Chaplain provided the ministry of prayer and a spiritual presence.    05/16/17 1030  Clinical Encounter Type  Visited With Patient  Visit Type Initial;Spiritual support  Referral From Nurse  Consult/Referral To Chaplain  Spiritual Encounters  Spiritual Needs Prayer

## 2017-05-16 NOTE — Progress Notes (Signed)
PULMONARY / CRITICAL CARE MEDICINE   Name: Cindy Weaver W Jarvie MRN: [id number] DOB: 10/10/1926    ADMISSION DATE:  04/16/2017   CONSULTATION DATE:  05/03/2017  REFERRING MD:  Dr. Anne HahnWillis  REASON: Afib with RVR and hypotension  CHIEF COMPLAINT:  Dyspnea  HISTORY OF PRESENT ILLNESS:   This is an 83 y/o female who was transferred to the ICU from the OR for hypoxia following a left hip hemiarthoplasty. Her CXR showed pulmonary edema and bilateral pleural effusions. Patient was treated in the ICU for 2 days and transferred out on 05/13/2017. She had a right thoracentesis today with 850 mL of fluid taken out. She was on Lasix and was followed closely by nephrology for low urine output. This evening at about 11:30 PM, patient went into A. fib with RVR and became hypotensive. She is on by mouth amiodarone. She was on Coumadin but it was discontinued secondary to a supratherapeutic INR. Patient has baseline CKD stage III but her labs showed worsening kidney functions. Her last dialysis session was on July 26. She is being followed by nephrology and scheduled for hemodialysis as needed. She denies chest pain, palpitations, nausea, vomiting, diarrhea, and dizziness, but reports generalized weakness and left hip pain. Labs in the ICU show hypokalemia with a potassium level of 6.1, creatinine of 4.02, up from 3.68, INR of 3.81 and a WBC of 19.6. Her urine output remains poor    SUBJECTIVE:  No distress, rattling cough, cognition intact, remains on phenylephrine infusion.  VITAL SIGNS: BP (!) 88/65   Pulse 99   Temp 98.5 F (36.9 C) (Oral)   Resp 17   Ht 5\' 8"  (1.727 m)   Wt 164 lb 0.4 oz (74.4 kg)   SpO2 92%   BMI 24.94 kg/m   HEMODYNAMICS:    VENTILATOR SETTINGS:    INTAKE / OUTPUT: I/O last 3 completed shifts: In: 504.5 [I.V.:504.5] Out: 2168 [Urine:325; Drains:480; Other:1363]  PHYSICAL EXAMINATION: General: chronically ill looking Neuro:   diffusely weak, no focal deficits HEENT:    WNL Cardiovascular:   IRIR, rate controlled, no murmur Lungs: Scattered rhonchi Abdomen: Soft, NT, NABS Extremities: No edema Skin:  Warm and dry  LABS:  BMET  Recent Labs Lab 04/18/2017 2327 05/15/17 0530 05/16/17 0512  NA 139 138 139  K 6.1* 5.3* 5.0  CL 103 105 104  CO2 24 24 27   BUN 81* 76* 62*  CREATININE 4.02* 4.12* 3.32*  GLUCOSE 142* 97 92    Electrolytes  Recent Labs Lab 05/12/17 0441  04/21/2017 2327 05/15/17 0530 05/16/17 0512  CALCIUM 8.2*  < > 8.6* 8.0* 8.5*  MG 2.0  --  2.6* 2.8*  --   PHOS 3.5  --  3.6 3.2  --   < > = values in this interval not displayed.  CBC  Recent Labs Lab 05/15/17 0530 05/16/17 0512 05/16/17 1116  WBC 16.8* 18.5* 17.7*  HGB 9.4* 9.1* 8.7*  HCT 28.2* 27.5* 26.7*  PLT 177 183 155    Coag's  Recent Labs Lab March 30, 2017 0210  05/09/2017 2327 05/15/17 0530 05/16/17 0512  APTT 31.1*  --   --   --   --   INR 1.38  < > 3.81 3.17 2.26  < > = values in this interval not displayed.  Sepsis Markers  Recent Labs Lab 04/29/2017 2327 05/15/17 0530 05/15/17 0640 05/16/17 0512  LATICACIDVEN 2.2*  --  1.5  --   PROCALCITON 1.12 1.05  --  1.02  ABG No results for input(s): PHART, PCO2ART, PO2ART in the last 168 hours.  Liver Enzymes  Recent Labs Lab 05/10/17 0306 05/13/2017 2327  AST  --  20  ALT  --  12*  ALKPHOS  --  86  BILITOT  --  0.9  ALBUMIN 2.1* 2.2*    Cardiac Enzymes  Recent Labs Lab 04/21/2017 2327 05/15/17 0530  TROPONINI 0.06* 0.06*    Glucose  Recent Labs Lab 04/22/2017 1638 05/12/17 1614 05/15/17 0037  GLUCAP 83 117* 136*    Imaging Dg Chest Port 1 View  Result Date: 05/16/2017 CLINICAL DATA:  Respiratory failure. EXAM: PORTABLE CHEST 1 VIEW COMPARISON:  05/15/2017. FINDINGS: Prior CABG and cardiac valve replacement. Small metallic density noted over the lower mid chest is unchanged in position. Stable cardiomegaly. Persistent but slightly improved bilateral pulmonary interstitial  prominence. Bilateral pleural effusions are stable. No pneumothorax. IMPRESSION: Prior CABG and cardiac valve replacement. Congestive heart failure with interstitial edema, slightly improved from prior exam. Stable bilateral pleural effusions. Electronically Signed   By: Maisie Fushomas  Register   On: 05/16/2017 06:33     STUDIES:  Last echo done on 04/25/17 showed a LV EF: 35% -   40%  CULTURES: None  ANTIBIOTICS: Azithromycin-completed Clindamycin-completed   SIGNIFICANT EVENTS: 10/24/2016>> [facility] Tele[facility] s/p Fall and Left hip fracture 05/04/17>> PCCM consulted for Acute Hypoxic Respiratory Failure 07/27>Left hemiarthroplasty 07/30: right thoracentesis-850 cc out 07/30>Bounced back to ICU for Afib with RVR and hypotension  LINES/TUBES: PIVs  DISCUSSION: 83 y/o female with a h/o afib, CAD, CKD and CHF presenting with acute hypoxic respiratory failure s/p left hemiarthroplasty, afib with RVR and hypotension  ASSESSMENT Afib with RVR Hypotension/shock Hypokalemia Acute hypoxic respiratory failure Bilateral pleural effusions, status post right thoracentesis Pulmonary edema-improved Acute on chronic renal failure-decreased urine output Femoral neck fracture s/p Left hip hemiarthroplasty H/O Aortic valve replacement H/o CHF H/O Anemia  PLAN Hemodynamics per ICU D50 with 10 units of insulin IV 1 Supplemental O2 via [place] to keep SPO2>90% Hold off on IV diuretics. Gentle IV hydration with normal saline 2 L over 2 hours We will reconsult nephrology  HD per nephrology CXR and EKG daily prn Trend cardiac enzymes Amiodarone bolus and infusion, hold for heart rate less than 50 bpm Hold by mouth amiodarone Monitor I/O Flutter valve IS  Pain management Monitor and correct electrolyte imbalances  Billy Fischeravid Dakarri Kessinger, MD PCCM service Mobile [phone] Pager [phone] 05/16/2017 3:53 PM

## 2017-05-16 NOTE — Progress Notes (Signed)
Speech Language Pathology Treatment: Dysphagia  Patient Details Name: Cindy Weaver MRN: [id number] DOB: 06/13/1926 Today's Date: 05/16/2017 Time: 1615-1700 SLP Time Calculation (min) (ACUTE ONLY): 45 min  Assessment / Plan / Recommendation Clinical Impression  Pt seen for toleration of diet and trials to upgrade diet consistency during this tx session today. Pt alert, talkative and requesting "regular" water to drink if possible. Discussed w/ her the initial BSE and her presentation during it. Both NSG and pt felt she had improved medically since then. Chart notes reviewed. Pt did exhibit a deep, congested cough w/ phlegm production x1-2 during conversation - noted recent CXR results and NSG's report of pt expectorating moderate phlegm baseline during the day. Pt's vocal quality was clear, strong and she was A/O able to follow through w/ all tasks and discussion.  Pt was given trials of ice chips then thin liquids via cup and straw following strict aspiration precautions of small, single sips - slowly. Pt consumed all trials of thin liquids w/ no immediate, overt s/s of aspiration noted and no decline in O2 sats (98-100%) or respiratory effort. Pt exhibited clear vocal quality post trials. She was able to feed herself and follow through w/ the instructions appropriately. During and post all the trials, pt exhibited the deep, congested cough x1-2 - this cough did not appear to increase in frequency w/ trials and was similar to her baseline cough prior to po's given. Pt herself stated "it was just my phlegm; I'm not strangling".  Recommend diet upgrade to thin liquids, dysphagia level 2 foods(d/t dentition status); aspiration precautions; Pills in Puree whole for swallowing. Recommend NSG monitor pt w/ po's and at meals for any s/s of aspiration. ST services will f/u next 1-2 days w/ pt's toleration of diet. NP/NSG and Dietician updated.     HPI HPI: Patient is an 83 y/o female with past medical  history of multiple medical dxs including Malnutrition of moderate degree, CHF, and A. fib, CAD s/p CABG, MI, GERD on PPI who presents emergency department after suffering a fall. The patient states that she went to pick up something from the floor and then recalls only being on the ground and unable to stand. She does not recall losing consciousness but she did not hit her head. Unfortunately she felt pain in her left hip which was found to be fractured upon arrival to the emergency department. The patient was also in atrial fibrillation with rapid ventricular rate. She was given diltiazem 5 mg IV which improved her rate slightly. However her blood pressure remained mildly low. The patient also had a wet cough and chest x-ray demonstrating pulmonary edema. She was given Lasix IV in the ED. Pt's kidney function declined during this admission and hemodialysis was begun. Pt was seen by ST services a few days post admission. Pt exhibited s/s of dysphagia at evaluation and was placed on a dysphagia diet w/ Honey consistency liquids. Pt has been tolerating an oral diet but not drinking the thickened liquids d/t her dislike of them. NSG reported good toleration of pills in puree. Noted recent CXR results of pulmonary edema, bilateral pleural effusions.       SLP Plan  Continue with current plan of care;Goals updated; continue to monitor toleration of diet w/ education on aspiration precautions w/ pt/family       Recommendations  Diet recommendations: Dysphagia 2 (fine chop);Thin liquid Liquids provided via: Cup;Straw Medication Administration: Whole meds with puree Supervision: Patient able to self feed;Intermittent supervision to  cue for compensatory strategies Compensations: Minimize environmental distractions;Slow rate;Small sips/bites;Lingual sweep for clearance of pocketing;Multiple dry swallows after each bite/sip;Follow solids with liquid Postural Changes and/or Swallow Maneuvers: Seated upright 90  degrees;Upright 30-60 min after meal (Reflux precautions)                General recommendations:  (Dietician f/u) Oral Care Recommendations: Oral care BID;Patient independent with oral care;Staff/trained caregiver to provide oral care Follow up Recommendations: Skilled Nursing facility (TBD) SLP Visit Diagnosis: Dysphagia, oropharyngeal phase (R13.12) Plan: Continue with current plan of care;Goals updated       GO                Jerilynn SomKatherine Watson, MS, CCC-SLP Watson,Katherine 05/16/2017, 5:26 PM

## 2017-05-16 NOTE — Progress Notes (Signed)
Neosynephrine restarted this shift d/t SBP in 70s, MAP in 50s, and NO UOP.  Currently infusing at 50 mcg/min

## 2017-05-16 NOTE — Progress Notes (Signed)
Dr Ernest PineHooten notified of 600 ccs serous output into wound vac, continue to monitor, no further orders at this time

## 2017-05-16 NOTE — Progress Notes (Signed)
Rt Fem HD cath Dcd, pt bled excessively, Dr Sung AmabileSimonds notified, we will check labs, pt also has 600 ccs in wound vac, will notify Dr Ernest PineHooten when he calls to check on patient, will wait for lab results for any further notifications

## 2017-05-16 NOTE — Progress Notes (Addendum)
[employer] [facility] [place] [facility]   PATIENT NAME: Cindy FoleyLiller Weaver    MR#:  [id number]  DATE OF BIRTH:  11/25/1925  SUBJECTIVE:   Status post left hip hemiarthroplasty, postop day #5. today.  Transferred back to ICU overnight due to low BP and a. Fib w/ RVR on amio gtt. Weaned off amio gtt and HR has improved.  But still hypotension with SBP at 70's-80's, on phenylephrine drip.  REVIEW OF SYSTEMS:    Review of Systems  Constitutional: Positive for malaise/fatigue. Negative for chills and fever.  HENT: Negative for congestion and tinnitus.   Eyes: Negative for blurred vision and double vision.  Respiratory: Negative for cough, shortness of breath and wheezing.   Cardiovascular: Negative for chest pain, orthopnea and PND.  Gastrointestinal: Negative for abdominal pain, diarrhea, nausea and vomiting.  Genitourinary: Negative for dysuria and hematuria.  Musculoskeletal: Positive for joint pain (Left Hip).  Neurological: Positive for weakness. Negative for dizziness, sensory change and focal weakness.  All other systems reviewed and are negative.   Nutrition: Dysphagia II with nectar thick liquids Tolerating Diet: Yes Tolerating PT: Await Eval.   DRUG ALLERGIES:   Allergies  Allergen Reactions  . Penicillins Itching and Rash    Other reaction(s): RASH  . Enalapril Other (See Comments)    cough  . Statins Other (See Comments)    Other reaction(s): MUSCLE PAIN    VITALS:  Blood pressure (!) 88/65, pulse 99, temperature 98.5 F (36.9 C), temperature source Oral, resp. rate 17, height 5\' 8"  (1.727 m), weight 164 lb 0.4 oz (74.4 kg), SpO2 92 %.  PHYSICAL EXAMINATION:   Physical Exam  GENERAL:  83 y.o.-year-old patient lying in bed in mild Resp. Distress.  EYES: Pupils equal, round, reactive to light and accommodation. No scleral icterus. Extraocular muscles intact.  HEENT: Head atraumatic, normocephalic. Oropharynx and nasopharynx clear.  NECK:  Supple, no  jugular venous distention. No thyroid enlargement, no tenderness.  LUNGS: Normal breath sounds bilaterally, no wheezing, rales on right mid to lower lung fields, No rhonchi. No use of accessory muscles of respiration.  CARDIOVASCULAR: S1, S2 Irregular. No murmurs, rubs, or gallops.  ABDOMEN: Soft, nontender, nondistended. Bowel sounds present. No organomegaly or mass.  EXTREMITIES: No cyanosis, clubbing, has leg edema b/l. Left Hip dressing w/ wound vac in place.     NEUROLOGIC: Cranial nerves II through XII are intact. No focal Motor or sensory deficits b/l.  Globally weak.  PSYCHIATRIC: The patient is alert and oriented x 3.  SKIN: No obvious rash, lesion, or ulcer.    LABORATORY PANEL:   CBC  Recent Labs Lab 05/16/17 1116  WBC 17.7*  HGB 8.7*  HCT 26.7*  PLT 155   ------------------------------------------------------------------------------------------------------------------  Chemistries   Recent Labs Lab 04/21/2017 2327 05/15/17 0530 05/16/17 0512  NA 139 138 139  K 6.1* 5.3* 5.0  CL 103 105 104  CO2 24 24 27   GLUCOSE 142* 97 92  BUN 81* 76* 62*  CREATININE 4.02* 4.12* 3.32*  CALCIUM 8.6* 8.0* 8.5*  MG 2.6* 2.8*  --   AST 20  --   --   ALT 12*  --   --   ALKPHOS 86  --   --   BILITOT 0.9  --   --    ------------------------------------------------------------------------------------------------------------------  Cardiac Enzymes  Recent Labs Lab 05/15/17 0530  TROPONINI 0.06*   ------------------------------------------------------------------------------------------------------------------  RADIOLOGY:  Dg Chest Port 1 View  Result Date: 05/16/2017 CLINICAL DATA:  Respiratory failure.  EXAM: PORTABLE CHEST 1 VIEW COMPARISON:  05/15/2017. FINDINGS: Prior CABG and cardiac valve replacement. Small metallic density noted over the lower mid chest is unchanged in position. Stable cardiomegaly. Persistent but slightly improved bilateral pulmonary interstitial  prominence. Bilateral pleural effusions are stable. No pneumothorax. IMPRESSION: Prior CABG and cardiac valve replacement. Congestive heart failure with interstitial edema, slightly improved from prior exam. Stable bilateral pleural effusions. Electronically Signed   By: Maisie Fushomas  Register   On: 05/16/2017 06:33   Dg Chest Port 1 View  Result Date: 05/15/2017 CLINICAL DATA:  Chronic renal failure.  Congestive heart failure EXAM: PORTABLE CHEST 1 VIEW COMPARISON:  May 14, 2017 FINDINGS: There is diffuse interstitial pulmonary edema with small pleural effusions bilaterally. There is no appreciable airspace consolidation. There is cardiomegaly with pulmonary venous hypertension. There is aortic atherosclerosis. Patient is status post median sternotomy. No adenopathy. Bones are osteoporotic. There is calcification in both carotid arteries. IMPRESSION: Persistent changes of congestive heart failure. No airspace consolidation. There is aortic atherosclerosis as well as calcification in both carotid arteries. Aortic Atherosclerosis (ICD10-I70.0). Electronically Signed   By: Bretta BangWilliam  Woodruff III M.D.   On: 05/15/2017 07:05     ASSESSMENT AND PLAN:   83 year old female with past medical history of chronic afibrillation, history of coronary artery disease status post bypass, history of mitral levels placement, chronic respiratory failure, acute systolic CHF who presented to the hospital after a fall and noted to have left hip fracture.  1. Status post fall and left hip fracture-seen by orthopedics and is status post left hip hemiarthroplasty postoperative day #5 today. Temp dialysis cath was removed. She will get PT evaluation.    2. Atrial fibrillation with rapid ventricular response-Patient went into A. fib with RVR and had to be transferred to the intensive care unit and placed on amiodarone drip. Off the drip and switched over to oral amiodarone. Continue metoprolol, continue to hold Coumadin and his INR  is therapeutic. Hold to get perm cath.  3. Acute kidney injury-baseline creatinine around 1.2-1.3. Suspect this is ATN from hypotension -Did not respond to fluids, therefore started on hemodialysis. Nephrology following, and remains oliguric and volume overloaded. She got hemodialysis 3 times so far. Plan to change the left groin temporary catheter to perm catheter hopefully next 1-2 days.  4. Pleural effusion-patient's  Status post right-sided ultrasound-guided thoracentesis with 850 cc of fluid removed. Stable pleural effusion per CXR today.  5. COPD-no acute exacerbation-continue albuterol nebulizers, prednisone, Spiriva.  6. GERD-continue Protonix.  7. Acute blood loss anemia-secondary to recent surgery. -Transfused 1 unit of packed red blood cells and hemoglobin is stable. Continue to follow serial hemoglobins for now.  8. Constipation-continue MiraLAX, Senokot.  Hypotension. Started phenylephrine drip just now.  Poor prognosis, palliative care consult. All the records are reviewed and case discussed with Care Management/Social Worker. Management plans discussed with the patient, family and they are in agreement.  CODE STATUS: Full  DVT Prophylaxis: Coumadin on hold  TOTAL TIME TAKING CARE OF THIS PATIENT: 37 minutes.   POSSIBLE D/C IN ? DAYS, DEPENDING ON CLINICAL CONDITION.   Shaune Pollackhen, Edison Wollschlager M.D on 05/16/2017 at 3:19 PM  Between 7am to 6pm - Pager - [phone]  After 6pm go to [email or web address] - Therapist, nutritionalpassword EPAS [facility]  [employer] [facility] [place] [facility]  Office  [phone]  CC: Primary care physician; Elita QuickHill, [facility]

## 2017-05-16 DEATH — deceased

## 2017-05-17 ENCOUNTER — Inpatient Hospital Stay: Payer: Medicare Other

## 2017-05-17 LAB — CBC
HEMATOCRIT: 27.3 % — AB (ref 35.0–47.0)
Hemoglobin: 8.9 g/dL — ABNORMAL LOW (ref 12.0–16.0)
MCH: 30.2 pg (ref 26.0–34.0)
MCHC: 32.7 g/dL (ref 32.0–36.0)
MCV: 92.5 fL (ref 80.0–100.0)
Platelets: 207 10*3/uL (ref 150–440)
RBC: 2.95 MIL/uL — ABNORMAL LOW (ref 3.80–5.20)
RDW: 18.4 % — AB (ref 11.5–14.5)
WBC: 26.5 10*3/uL — ABNORMAL HIGH (ref 3.6–11.0)

## 2017-05-17 LAB — BASIC METABOLIC PANEL
Anion gap: 9 (ref 5–15)
BUN: 74 mg/dL — AB (ref 6–20)
CALCIUM: 8.4 mg/dL — AB (ref 8.9–10.3)
CO2: 24 mmol/L (ref 22–32)
Chloride: 104 mmol/L (ref 101–111)
Creatinine, Ser: 3.43 mg/dL — ABNORMAL HIGH (ref 0.44–1.00)
GFR calc Af Amer: 13 mL/min — ABNORMAL LOW (ref >60)
GFR, EST NON AFRICAN AMERICAN: 11 mL/min — AB (ref >60)
GLUCOSE: 104 mg/dL — AB (ref 65–99)
POTASSIUM: 5.4 mmol/L — AB (ref 3.5–5.1)
Sodium: 137 mmol/L (ref 135–145)

## 2017-05-17 LAB — PROTIME-INR
INR: 1.84
PROTHROMBIN TIME: 21.5 s — AB (ref 11.4–15.2)

## 2017-05-17 MED ORDER — METOPROLOL TARTRATE 5 MG/5ML IV SOLN
2.5000 mg | Freq: Once | INTRAVENOUS | Status: AC
  Administered 2017-05-17: 2.5 mg via INTRAVENOUS
  Filled 2017-05-17: qty 5

## 2017-05-17 MED ORDER — MORPHINE SULFATE (PF) 2 MG/ML IV SOLN
1.0000 mg | Freq: Once | INTRAVENOUS | Status: AC
  Administered 2017-05-17: 1 mg via INTRAVENOUS
  Filled 2017-05-17: qty 1

## 2017-05-17 MED ORDER — PHENYLEPHRINE HCL 10 MG/ML IJ SOLN
0.0000 ug/min | INTRAMUSCULAR | Status: DC
  Administered 2017-05-18: 300 ug/min via INTRAVENOUS
  Administered 2017-05-18: 50 ug/min via INTRAVENOUS
  Filled 2017-05-17: qty 4
  Filled 2017-05-17: qty 40
  Filled 2017-05-17: qty 4

## 2017-05-17 MED ORDER — MIDODRINE HCL 5 MG PO TABS
5.0000 mg | ORAL_TABLET | Freq: Three times a day (TID) | ORAL | Status: DC
  Administered 2017-05-17 (×2): 5 mg via ORAL
  Filled 2017-05-17 (×5): qty 1

## 2017-05-17 MED ORDER — OXYCODONE HCL 5 MG PO TABS
5.0000 mg | ORAL_TABLET | Freq: Once | ORAL | Status: AC
  Administered 2017-05-17: 5 mg via ORAL
  Filled 2017-05-17: qty 1

## 2017-05-17 NOTE — Progress Notes (Signed)
NP Annabelle Harmanana informed of ABDXR results, pt given 1 mg morphine and a suppository for "severe stool burden."  Pt has been in significant pain this afternoon; hip pain responsive to tramadol but ABD pain not responsive to pain meds.  Hopefully will resolve with relief of stool burden.  Neosynephrine is on delay for second hour, BP is within goal range, although remains soft.  No UOP this shift.  HR remains elevated, although improved with some pain control.  Unable to tolerate physical therapy this shift.

## 2017-05-17 NOTE — Progress Notes (Signed)
PULMONARY / CRITICAL CARE MEDICINE   Name: Cindy Weaver MRN: [id number] DOB: 02/26/1926    ADMISSION DATE:  04/16/2017   CONSULTATION DATE:  04/22/2017  REFERRING MD:  Dr. Anne HahnWillis  REASON: Afib with RVR and hypotension  CHIEF COMPLAINT:  Dyspnea  HISTORY OF PRESENT ILLNESS:   This is an 83 y/o female who was transferred to the ICU from the OR for hypoxia following a left hip hemiarthoplasty. Her CXR showed pulmonary edema and bilateral pleural effusions. Patient was treated in the ICU for 2 days and transferred out on 05/06/2017. She had a right thoracentesis today with 850 mL of fluid taken out. She was on Lasix and was followed closely by nephrology for low urine output. This evening at about 11:30 PM, patient went into A. fib with RVR and became hypotensive. She is on by mouth amiodarone. She was on Coumadin but it was discontinued secondary to a supratherapeutic INR. Patient has baseline CKD stage III but her labs showed worsening kidney functions. Her last dialysis session was on July 26. She is being followed by nephrology and scheduled for hemodialysis as needed. She denies chest pain, palpitations, nausea, vomiting, diarrhea, and dizziness, but reports generalized weakness and left hip pain. Labs in the ICU show hypokalemia with a potassium level of 6.1, creatinine of 4.02, up from 3.68, INR of 3.81 and a WBC of 19.6 she remains oliguric.   SUBJECTIVE:  No distress, rattling cough, cognition intact, remains on phenylephrine infusion.  VITAL SIGNS: BP 99/79   Pulse (!) 114   Temp (!) 97.4 F (36.3 C) (Axillary)   Resp 19   Ht 5\' 8"  (1.727 m)   Wt 74.3 kg (163 lb 12.8 oz)   SpO2 (!) 86%   BMI 24.91 kg/m   HEMODYNAMICS:    VENTILATOR SETTINGS:    INTAKE / OUTPUT: I/O last 3 completed shifts: In: 1409.5 [P.O.:236; I.V.:1173.5] Out: 950 [Urine:250; Drains:700]  PHYSICAL EXAMINATION: General: chronically ill appearing elderly female, NAD  Neuro: oriented, diffusely weak,  no focal deficits HEENT: supple, no JVD  Cardiovascular: IRIR, no M/R/G Lungs: Scattered rhonchi, even, non labored; no wheezes, rales, or crackles present  Abdomen: Soft, NT, +BS x4 Extremities: No edema Skin:  Warm and dry, left hip wound vac present dressing dry and intact with serous drainage   LABS:  BMET  Recent Labs Lab 05/15/17 0530 05/16/17 0512 05/17/17 0500  NA 138 139 137  K 5.3* 5.0 5.4*  CL 105 104 104  CO2 24 27 24   BUN 76* 62* 74*  CREATININE 4.12* 3.32* 3.43*  GLUCOSE 97 92 104*    Electrolytes  Recent Labs Lab 05/12/17 0441  04/23/2017 2327 05/15/17 0530 05/16/17 0512 05/17/17 0500  CALCIUM 8.2*  < > 8.6* 8.0* 8.5* 8.4*  MG 2.0  --  2.6* 2.8*  --   --   PHOS 3.5  --  3.6 3.2  --   --   < > = values in this interval not displayed.  CBC  Recent Labs Lab 05/16/17 0512 05/16/17 1116 05/17/17 0500  WBC 18.5* 17.7* 26.5*  HGB 9.1* 8.7* 8.9*  HCT 27.5* 26.7* 27.3*  PLT 183 155 207    Coag's  Recent Labs Lab 05/04/2017 0210  05/15/17 0530 05/16/17 0512 05/17/17 0500  APTT 31.1*  --   --   --   --   INR 1.38  < > 3.17 2.26 1.84  < > = values in this interval not displayed.  Sepsis Markers  Recent Labs Lab 05/13/2017 2327 05/15/17 0530 05/15/17 0640 05/16/17 0512  LATICACIDVEN 2.2*  --  1.5  --   PROCALCITON 1.12 1.05  --  1.02    ABG No results for input(s): PHART, PCO2ART, PO2ART in the last 168 hours.  Liver Enzymes  Recent Labs Lab 05/04/2017 2327  AST 20  ALT 12*  ALKPHOS 86  BILITOT 0.9  ALBUMIN 2.2*    Cardiac Enzymes  Recent Labs Lab 05/13/2017 2327 05/15/17 0530  TROPONINI 0.06* 0.06*    Glucose  Recent Labs Lab 05/10/2017 1638 05/12/17 1614 05/15/17 0037  GLUCAP 83 117* 136*    Imaging No results found.   STUDIES:  Last echo done on 05/12/2017 showed a LV EF: 35% -   40%  CULTURES: Body Fluid 07/30>>negative Respiratory 07/21>>negative    ANTIBIOTICS: Azithromycin-completed Clindamycin-completed   SIGNIFICANT EVENTS: 05/10/2017-[facility] s/p Fall and Left hip fracture 05/04/17-PCCM consulted for Acute Hypoxic Respiratory Failure 07/27-Left hemiarthroplasty 07/30-right thoracentesis-850 ml fluid removed  07/30-Bounced back to ICU for Afib with RVR and hypotension  LINES/TUBES: PIVs  DISCUSSION: 83 y/o female with a h/o afib, CAD, CKD and CHF presenting with acute hypoxic respiratory failure s/p left hemiarthroplasty, afib with RVR and hypotension  ASSESSMENT Afib with RVR Hypotension/shock Hypokalemia-resolved Acute hypoxic respiratory failure secondary to pulmonary edema and bilateral pleural effusions-(s/p right sided thoracentesis 07/30 removal of 850 ml of pleural fluid) Acute on chronic renal failure stage III: baseline creatinine 1.18, from 04/01/17 Femoral neck fracture s/p Left hip hemiarthroplasty 07/27 Hx: Aortic valve replacement, CHF, and Anemia  PLAN Supplemental O2 via [place] to keep SPO2>90% Prn neo-synephrine gtt to maintain map 60 or greater  Nephrology consulted appreciate input-recommending IJ permcath or temporary dialysis catheter placement if she needs more dialysis, however due to co morbidities she may have worsening quality of life with chronic dialysis  Continuous telemetry monitoring  Continue po amiodarone-hold for heart rate less than 50 bpm Trend BMP  Replace electrolytes as indicated Monitor intake and output Palliative care consulted to discuss goals of treatment appreciate input-pts family still wants full aggressive care at this time   Cindy Weaver, Cindy Weaver  Pulmonary/Critical Care Pager [phone] (please enter 7 digits) PCCM Consult Pager [phone] (please enter 7 digits)  Cindy Fischeravid Brentney Goldbach, MD PCCM service Mobile [phone] Pager [phone] 05/17/2017 3:10 PM

## 2017-05-17 NOTE — Progress Notes (Signed)
Pt with elevated HR and sighing and moaning with pain, 2.5 mg IV metoprolol pushed and one time dose Oxycodone given per NP.  Continue to monitor patient closely

## 2017-05-17 NOTE — Progress Notes (Signed)
[facility]  ROUNDING NOTE   Subjective:   Presented from home with fall 7/18. Left hip hemiarthroplasty 7/27  Underwent RIght thoracentesis on 7/30; . 850 cc of clear fluid was removed. hosp course complicated with A Fib with RVR. Dialysis started 7/24 HD done on 7/31 and dialysis cathter was removed 8/1  Still has significant amount of edema in the legs Dialysis cathter has been removed Eating very little Denies acute SOB Worked a little with PT yesterday  Objective:  Vital signs in last 24 hours:  Temp:  [97.4 F (36.3 C)-98.5 F (36.9 C)] 97.4 F (36.3 C) (08/02 0800) Pulse Rate:  [61-150] 114 (08/02 0900) Resp:  [13-27] 19 (08/02 0900) BP: (75-127)/(51-87) 99/79 (08/02 0900) SpO2:  [80 %-100 %] 86 % (08/02 0900) Weight:  [74.3 kg (163 lb 12.8 oz)] 74.3 kg (163 lb 12.8 oz) (08/02 0500)   Weight change: 1.5 kg (3 lb 4.9 oz) Filed Weights   05/15/17 1556 05/16/17 0500 05/17/17 0500  Weight: 71.4 kg (157 lb 6.5 oz) 74.4 kg (164 lb 0.4 oz) 74.3 kg (163 lb 12.8 oz)    Intake/Output: I/O last 3 completed shifts: In: 1409.5 [P.O.:236; I.V.:1173.5] Out: 950 [Urine:250; Drains:700]   Intake/Output this shift:  Total I/O In: 225 [I.V.:225] Out: 50 [Drains:50]  Physical Exam: General: Critically ill appearing  Head: Moist oral mucosal membranes  Eyes: Anicteric,    Neck: Supple,   Lungs:  Rhonchi b/l. Mild basilar crackles  Heart: irregular  Abdomen:  Soft, nontender  Extremities: ++ dependent peripheral edema. Left hip wound vac  Neurologic: Able to answer few questions, alert and oreinted  Skin: No acute lesions  Access:      Basic Metabolic Panel:  Recent Labs Lab 05/12/17 0441  04/20/2017 0400 05/08/2017 2327 05/15/17 0530 05/16/17 0512 05/17/17 0500  NA 139  < > 137 139 138 139 137  K 4.5  < > 4.8 6.1* 5.3* 5.0 5.4*  CL 103  < > 102 103 105 104 104  CO2 26  < > _0 GLUCOSE 77  < > 98 142* 97 92 104*  BUN 42*  < > 74* 81* 76*  62* 74*  CREATININE 2.59*  < > 3.68* 4.02* 4.12* 3.32* 3.43*  CALCIUM 8.2*  < > 8.2* 8.6* 8.0* 8.5* 8.4*  MG 2.0  --   --  2.6* 2.8*  --   --   PHOS 3.5  --   --  3.6 3.2  --   --   < > = values in this interval not displayed.  Liver Function Tests:  Recent Labs Lab 04/19/2017 2327  AST 20  ALT 12*  ALKPHOS 86  BILITOT 0.9  PROT 5.4*  ALBUMIN 2.2*   No results for input(s): LIPASE, AMYLASE in the last 168 hours. No results for input(s): AMMONIA in the last 168 hours.  CBC:  Recent Labs Lab 05/06/2017 2327 05/15/17 0530 05/16/17 0512 05/16/17 1116 05/17/17 0500  WBC 19.6* 16.8* 18.5* 17.7* 26.5*  HGB 9.5* 9.4* 9.1* 8.7* 8.9*  HCT 28.5* 28.2* 27.5* 26.7* 27.3*  MCV 89.4 90.3 90.0 92.7 92.5  PLT 200 177 183 155 207    Cardiac Enzymes:  Recent Labs Lab 04/18/2017 2327 05/15/17 0530  TROPONINI 0.06* 0.06*    BNP: Invalid input(s): POCBNP  CBG:  Recent Labs Lab 05/03/2017 1638 05/12/17 1614 05/15/17 0037  GLUCAP 83 117* 136*    Microbiology: Results for orders placed or performed during the  hospital encounter of 04/21/2017  MRSA PCR Screening     Status: None   Collection Time: 05/04/17  8:27 AM  Result Value Ref Range Status   MRSA by PCR NEGATIVE NEGATIVE Final    Comment:        The GeneXpert MRSA Assay (FDA approved for NASAL specimens only), is one component of a comprehensive MRSA colonization surveillance program. It is not intended to diagnose MRSA infection nor to guide or monitor treatment for MRSA infections.   Culture, expectorated sputum-assessment     Status: None   Collection Time: 05/05/17 10:55 AM  Result Value Ref Range Status   Specimen Description SPUTUM  Final   Special Requests Normal  Final   Sputum evaluation THIS SPECIMEN IS ACCEPTABLE FOR SPUTUM CULTURE  Final   Report Status 05/05/2017 FINAL  Final  Culture, respiratory (NON-Expectorated)     Status: None   Collection Time: 05/05/17 10:55 AM  Result Value Ref Range  Status   Specimen Description SPUTUM  Final   Special Requests Normal Reflexed from [id number]  Final   Gram Stain   Final    FEW WBC PRESENT, PREDOMINANTLY PMN FEW GRAM NEGATIVE RODS FEW GRAM POSITIVE COCCI IN PAIRS FEW GRAM POSITIVE RODS RARE BUDDING YEAST SEEN    Culture   Final    Consistent with normal respiratory flora. Performed at [facility], [place] [address] [place], [place] [address]    Report Status 05/09/2017 FINAL  Final  Culture, body fluid-bottle     Status: None (Preliminary result)   Collection Time: 05/03/2017 11:25 AM  Result Value Ref Range Status   Specimen Description FLUID PLEURAL  Final   Special Requests BOTTLES DRAWN AEROBIC ONLY  Final   Culture   Final    NO GROWTH 2 DAYS Performed at [facility], [place] [address], [place], [place] [address]    Report Status PENDING  Incomplete  Gram stain     Status: None   Collection Time: 04/21/2017 11:25 AM  Result Value Ref Range Status   Specimen Description FLUID PLEURAL  Final   Special Requests NONE  Final   Gram Stain   Final    CYTOSPIN SMEAR WBC PRESENT,BOTH PMN AND MONONUCLEAR NO ORGANISMS SEEN    Report Status 05/04/2017 FINAL  Final    Coagulation Studies:  Recent Labs  05/10/2017 1706 04/21/2017 2327 05/15/17 0530 05/16/17 0512 05/17/17 0500  LABPROT 43.3* 38.5* 33.2* 25.3* 21.5*  INR 4.41* 3.81 3.17 2.26 1.84    Urinalysis: No results for input(s): COLORURINE, LABSPEC, PHURINE, GLUCOSEU, HGBUR, BILIRUBINUR, KETONESUR, PROTEINUR, UROBILINOGEN, NITRITE, LEUKOCYTESUR in the last 72 hours.  Invalid input(s): APPERANCEUR    Imaging: Dg Chest Port 1 View  Result Date: 05/16/2017 CLINICAL DATA:  Respiratory failure. EXAM: PORTABLE CHEST 1 VIEW COMPARISON:  05/15/2017. FINDINGS: Prior CABG and cardiac valve replacement. Small metallic density noted over the lower mid chest is unchanged in position. Stable cardiomegaly. Persistent but slightly improved bilateral pulmonary interstitial  prominence. Bilateral pleural effusions are stable. No pneumothorax. IMPRESSION: Prior CABG and cardiac valve replacement. Congestive heart failure with interstitial edema, slightly improved from prior exam. Stable bilateral pleural effusions. Electronically Signed   By: Marcello Moores  Register   On: 05/16/2017 06:33     Medications:   . phenylephrine (NEO-SYNEPHRINE) Adult infusion     . albuterol  2.5 mg Nebulization TID  . amiodarone  200 mg Oral Daily  . budesonide (PULMICORT) nebulizer solution  0.5 mg Nebulization BID  .  chlorhexidine  15 mL Mouth Rinse BID  . feeding supplement (ENSURE ENLIVE)  237 mL Oral TID BM  . ferrous sulfate  325 mg Oral Q breakfast  . mouth rinse  15 mL Mouth Rinse q12n4p  . multivitamin with minerals  1 tablet Oral Daily  . pantoprazole  40 mg Oral Daily  . phytonadione  0.2 mg Oral Daily  . polyethylene glycol  17 g Oral Daily  . senna-docusate  2 tablet Oral BID  . tiotropium  18 mcg Inhalation Daily  . Warfarin - Pharmacist Dosing Inpatient   Does not apply q1800   acetaminophen **OR** acetaminophen, bisacodyl, guaiFENesin, magnesium hydroxide, [DISCONTINUED] metoCLOPramide **OR** metoCLOPramide (REGLAN) injection, [DISCONTINUED] ondansetron **OR** ondansetron (ZOFRAN) IV, oxyCODONE, pneumococcal 23 valent vaccine, traMADol  Assessment/ Plan:  Cindy Weaver is a 83 y.o. black female with Chronic kidney disease stage III baseline EGFR 52, congestive heart failure, hyperlipidemia, disease status post CABG, anemia of chronic kidney disease, chronic atrial fibrillation, severe mitral regurgitation, hypertension, GERD, COPD,   Hospital course, patient was admitted to [facility] on 04/22/2017 for evaluation of Fall with left hip fracture and also had Atrial fibrillation with RVR and acute renal failure. Hip surgery was postponed until 7/27 due to coagulopathy with elevated INR. Taken off warfarin, restarted after surgery. Now on heparin gtt. Started HD 7/24.   1.  Acute renal failure on chronic kidney disease stage III: baseline creatinine 1.18, GFR of 52 from 04/01/17 Required Hemodialysis treatment on 7/24, 7/26, 7/31 Had some UOP but no foley so not quantified. BP is low normal - Breathing status has improved.   - S Cr and BUN remain critically elevated - depending on labs and resp status, if she needs more dialysis prefer IJ permcath  Or IJ temp dialysis cathter - Due comorbidities, she may have worsning quality of life with chronic dialysis    2.  Hypotension with atrial fibrillation with rapid ventricular response:  - on amiodarone - low normal BP  3. LE edema -  3rd spacing from low albumin  4. Mild hyperkalemia - low potassium diet     LOS: 15 Mariena Meares 8/2/201810:01 AM

## 2017-05-17 NOTE — Progress Notes (Signed)
Daily Progress Note   Patient Name: Cindy Weaver       Date: 05/17/2017 DOB: 04/14/1926  Age: 83 y.o. MRN#: [id number] Attending Physician: Shaune Pollackhen, Qing, MD Primary Care Physician: Elita QuickHill, [facility] Admit Date: 04/16/2017  Reason for Consultation/Follow-up: Establishing goals of care  Subjective: Cindy Weaver appears just uncomfortable and fatigued today. This seems even worse today than other days.   Length of Stay: 15  Current Medications: Scheduled Meds:  . albuterol  2.5 mg Nebulization TID  . amiodarone  200 mg Oral Daily  . budesonide (PULMICORT) nebulizer solution  0.5 mg Nebulization BID  . chlorhexidine  15 mL Mouth Rinse BID  . feeding supplement (ENSURE ENLIVE)  237 mL Oral TID BM  . ferrous sulfate  325 mg Oral Q breakfast  . mouth rinse  15 mL Mouth Rinse q12n4p  . midodrine  5 mg Oral TID WC  . multivitamin with minerals  1 tablet Oral Daily  . pantoprazole  40 mg Oral Daily  . phytonadione  0.2 mg Oral Daily  . polyethylene glycol  17 g Oral Daily  . senna-docusate  2 tablet Oral BID    Continuous Infusions: . phenylephrine (NEO-SYNEPHRINE) Adult infusion      PRN Meds: acetaminophen **OR** acetaminophen, bisacodyl, guaiFENesin, magnesium hydroxide, [DISCONTINUED] metoCLOPramide **OR** metoCLOPramide (REGLAN) injection, [DISCONTINUED] ondansetron **OR** ondansetron (ZOFRAN) IV, pneumococcal 23 valent vaccine, traMADol  Physical Exam         Constitutional: She is oriented to person, place, and time. She appears well-developed.  Head: Normocephalic and atraumatic.  Cardiovascular: An irregularly irregular rhythm present. Regular rate 90s present.   Pulmonary/Chest: Effort normal. No accessory muscle usage. No tachypnea. Mild-mod respiratory distress.    Productive cough, Rhonchi Abdominal: Soft. Normal appearance.  Neurological: She is alert and oriented to person, place, and time.  She is tired and restless  Nursing note and vitals reviewed.  Vital Signs: BP (!) 88/67   Pulse 93   Temp 97.7 F (36.5 C) (Axillary)   Resp (!) 23   Ht 5\' 8"  (1.727 m)   Wt 74.3 kg (163 lb 12.8 oz)   SpO2 94%   BMI 24.91 kg/m  SpO2: SpO2: 94 % O2 Device: O2 Device: Nasal Cannula O2 Flow Rate: O2 Flow Rate (L/min): 4 L/min  Intake/output summary:  Intake/Output Summary (Last 24 hours) at 05/17/17 1838 Last data filed at 05/17/17 1600  Gross per 24 hour  Intake          1143.75 ml  Output              100 ml  Net          1043.75 ml   LBM: Last BM Date: 05/15/17 Baseline Weight: Weight: 56.2 kg (124 lb) Most recent weight: Weight: 74.3 kg (163 lb 12.8 oz)       Palliative Assessment/Data: 20%    Flowsheet Rows     Most Recent Value  Intake Tab  Referral Department  Hospitalist  Unit at Time of Referral  Cardiac/Telemetry Unit  Palliative Care Primary Diagnosis  Cardiac  Date Notified  04/15/2017  Palliative Care Type  New Palliative care  Reason for referral  Clarify Goals of Care  Date of Admission  05/05/2017  # of days IP prior to Palliative referral  5  Clinical Assessment  Psychosocial & Spiritual Assessment  Palliative Care Outcomes      Patient Active Problem List   Diagnosis Date Noted  . Closed nondisplaced intertrochanteric fracture of left femur (HCC)   . Pressure injury of skin 05/13/2017  . Cough   . Non-intractable vomiting with nausea   . Acute renal failure (ARF) (HCC)   . Goals of care, counseling/discussion   . Palliative care encounter   . Malnutrition of moderate degree 05/04/2017  . Atrial fibrillation with RVR (HCC) 04/28/2017  . Hip fracture (HCC) 04/21/2017  . Acute on chronic systolic CHF (congestive heart failure) (HCC) 04/26/2017  . Acute on chronic respiratory failure with hypoxia (HCC)  04/28/2017  . Status post aortic valve replacement 05/03/2017  . Status post mitral valve repair 05/10/2017  . AKI (acute kidney injury) (HCC) 04/28/2017  . History of Coumadin therapy 04/27/2017  . Elevated troponin 04/20/2017  . Hypotension 04/30/2017  . A-fib (HCC) 06/17/2015  . Dental disease 06/17/2015  . MI (mitral incompetence) 04/25/2015  . Bursitis of knee 10/19/2014  . Anemia 02/18/2014  . Positional vertigo 12/15/2013  . CCF (congestive cardiac failure) (HCC) 05/22/2008    Palliative Care Assessment & Plan   HPI: 83 y.o. female  with past medical history of systolic CHF (EF 40%45%) on 2L at home, CAD, atrial fibrillation, s/p aortic valve replacement, s/p mitral valve clipping for severe mitral regurgitation admitted on 05/09/2017 with fall with left hip fracture. Hospital stay complicated by atrial fibrillation and heart failure which has delayed surgery to repair hip fracture. Atrial fibrillation controlled and now with worsening renal failure and beginning trial of dialysis 05/08/17.    Assessment: Cindy Weaver is very sleepy today and not able to participate in conversation. I spoke privately with her daughter, Marcelino DusterMichelle. I reviewed with Marcelino DusterMichelle her mother's progression which unfortunately has including increasing complications without any ground gained in improvement. She is suffering from heart failure, renal failure requiring dialysis, recurrent issues with atrial fibrillation, requiring vasopressors, severe;y weakened, and not really eating more than a few bites. I explained that we are getting to the point where we have to think where do we go from here because what we have been doing is not working to make her better. I explained the stress of dialysis and the fact that she cannot even maintain an adequate BP off dialysis is not a good sign and that she is unlikely to physically tolerate this long. Explained that it is time to seriously consider  limitations and strongly  recommended DNR and why this is appropriate. Marcelino Duster is tearful but receptive. She understands but tells me "I still see fight in her." She shares that she is currently building a house in [place] that she had planned to move to care for her mother more - "she has to get better." Marcelino Duster says she cannot agree to DNR but is willing to continue to consider and discuss further with her sister and mother if able. Emotional support provided. I gave [employer] booklet.   I spoke over the phone with Cindy Weaver' other daughter, Burna Mortimer, over phone (she is back in [place]). Explained the same as I had with Marcelino Duster above. Burna Mortimer tells me that she had a feeling that her mother was not doing well and this was coming but she was not aware that she is not really eating and doing this poorly. Burna Mortimer shares that she believes that her mother should not suffer and would be open to DNR and no further dialysis. She also shared that she knows that her sister will not agree with this and that she usually succeeds to her sister's wishes. She was very appreciative of our conversation and the information today. Burna Mortimer will speak more with Marcelino Duster.   Recommendations/Plan:  Wet/congested cough: Improved after dialysis.    Nausea: Continue Zofran prn. Reglan prn. Much improved.   Insomnia: If not improved with treatment of other symptoms may consider trazodone 25 mg qhs. This has also improved.   Pain left hip: Recommend OxyIR 5 mg every 4 hours prn.  Goals of Care and Additional Recommendations:  Limitations on Scope of Treatment: Full Scope Treatment  Code Status:  Full code - family to discuss further  Prognosis:   Unable to determine but poor given multiple severe comorbitities   Discharge Planning:  To Be Determined   Thank you for allowing the Palliative Medicine Team to assist in the care of this patient.   Total Time Prolonged Time Billed  no       Greater than 50%  of this time was spent  counseling and coordinating care related to the above assessment and plan.  Yong Channel, NP Palliative Medicine Team Pager # [phone] (M-F 8a-5p) Team Phone # [phone] (Nights/Weekends)

## 2017-05-17 NOTE — Progress Notes (Signed)
Pt has been asleep most of the shift. Had to put pt on venturi mask since pt is a mouth breather and desats when she sleeps. Requested pain medication for sleep earlier and then requested pain medication this AM for hip pain. Daughter at bedside. No other concerns. Continue on Neo gtt and maintaining BP.

## 2017-05-17 NOTE — Progress Notes (Signed)
Nutrition Follow-up  DOCUMENTATION CODES:   Non-severe (moderate) malnutrition in context of chronic illness  INTERVENTION:  Continue Ensure Enlive po TID, each supplement provides 350 kcal and 20 grams of protein. RD offered to change patient's oral nutrition supplement, but she refuses to change at this time.  Continue daily multivitamin with minerals.  Patient is unable to meet calorie/protein needs with PO intake, even after multiple interventions including diet liberalization, education, Ensure, Magic Cup, and snacks. In the past 24 hrs she has only met 30% of her calorie and 38% of her protein needs. Per discussion in rounds patient with poor prognosis and long-term dialysis may only worsen quality of life in setting of multiple co-morbidities. Would not recommend nutrition support at this time in setting of patient's poor prognosis, but will continue to monitor discussions regarding goals of care.  NUTRITION DIAGNOSIS:   Malnutrition related to  (heart disease and advanced age ) as evidenced by severe depletion of muscle mass, percent weight loss.  Ongoing.  GOAL:   Patient will meet greater than or equal to 90% of their needs  Not met.  MONITOR:   PO intake, Supplement acceptance, Labs, Weight trends  REASON FOR ASSESSMENT:   Consult Assessment of nutrition requirement/status  ASSESSMENT:   83 year old female with multiple medical problems including A. fib on Coumadin, CAD status post CABG, mitral valve clipping for severe MR, bioprosthetic aortic valve replacement surgery, chronic respiratory failure on 2 L home oxygen, Systolic CHF with EF of 49% presents to the hospital secondary to fall and noted to have left hip fracture.  -Pt transferred back to ICU on 7/30 due to low BP and A-fib with RVR. -Pt received another HD session on 7/31. Dialysis catheter removed 8/1. -On 8/1 liquid consistency was advanced to thin liquids. Patient remains on dysphagia 2 diet. -PMT  continues to follow patient/family. Patient remains full code at this time.  Spoke with patient and family members at bedside. Patient reports she is not eating well and is in pain. Family reports is tired of the Magic Cup that comes on dysphagia tray and is not eating them anymore. She likes regular ice cream better. Discussed that now that she has been advanced to thin liquids she will be able to have regular ice cream, too. She is only drinking one Ensure per day and is tired of them. However, when RD offered to change her supplement, patient reported she wanted to stick with Ensure. Family reports that patient is no longer receiving snacks between meals, but she does not want snacks anyway.   Meal Completion: bites-25% per chart In the past 24 hrs patient has had approximately 483 kcal (30% minimum estimated kcal needs), 28 grams of protein (38% minimum estimated protein needs).  Medications reviewed and include: amiodarone, ferrous sulfate 325 mg daily, MVI daily, pantoprazole, Miralax, vitamin K 0.2 mg daily, senna, phenylephrine gtt.  Labs reviewed: Potassium 5.4, BUN 74 (trending up), Creatinine 3.43 (trending up).  I/O: one unmeasured occurrence UOP past 24 hrs, 50 ml output from wound VAC this morning (had 700 ml output yesterday)  Weight trend: 74.3 kg on 8/2 (+17.9 kg since admission - likely in setting of third spacing)  Patient discussed in rounds this AM.  Diet Order:  DIET DYS 2 Room service appropriate? Yes with Assist; Fluid consistency: Thin  Skin:  Wound (see comment) (DTI left heel, closed incision hip)  Last BM:  05/15/2017 - type 6  Height:   Ht Readings from Last 1 Encounters:  05/15/17 5' 8"  (1.727 m)    Weight:   Wt Readings from Last 1 Encounters:  05/17/17 163 lb 12.8 oz (74.3 kg)    Ideal Body Weight:  64.7 kg  BMI:  Body mass index is 24.91 kg/m.  Estimated Nutritional Needs:   Kcal:  1600-1800kcal/day   Protein:  73-84g/day   Fluid:   >1.6L/day   EDUCATION NEEDS:   Education needs addressed  Willey Blade, MS, RD, LDN Pager: [phone] After Hours Pager: [phone]

## 2017-05-17 NOTE — Progress Notes (Signed)
[facility] - [place] at [facility]   PATIENT NAME: Cindy Weaver    MR#:  [id number]  DATE OF BIRTH:  15-Dec-1925  SUBJECTIVE:   Status post left hip hemiarthroplasty, postop day #6 today.  Transferred back to ICU overnight due to low BP and a. Fib w/ RVR on amio gtt. Weaned off amio gtt.  The patient still hashypotension with SBP at 80's, on phenylephrine drip. Afib RVR at 130's.  REVIEW OF SYSTEMS:    Review of Systems  Constitutional: Positive for malaise/fatigue. Negative for chills and fever.  HENT: Negative for congestion and tinnitus.   Eyes: Negative for blurred vision and double vision.  Respiratory: Negative for cough, shortness of breath and wheezing.   Cardiovascular: Negative for chest pain, orthopnea and PND.  Gastrointestinal: Negative for abdominal pain, diarrhea, nausea and vomiting.  Genitourinary: Negative for dysuria and hematuria.  Musculoskeletal: Negative for joint pain (Left Hip).  Neurological: Positive for weakness. Negative for dizziness, sensory change and focal weakness.  All other systems reviewed and are negative.   Nutrition: Dysphagia II with nectar thick liquids Tolerating Diet: Yes Tolerating PT: Await Eval.   DRUG ALLERGIES:   Allergies  Allergen Reactions  . Penicillins Itching and Rash    Other reaction(s): RASH  . Enalapril Other (See Comments)    cough  . Statins Other (See Comments)    Other reaction(s): MUSCLE PAIN    VITALS:  Blood pressure 90/65, pulse (!) 135, temperature 97.7 F (36.5 C), temperature source Axillary, resp. rate 20, height 5\' 8"  (1.727 m), weight 163 lb 12.8 oz (74.3 kg), SpO2 95 %.  PHYSICAL EXAMINATION:   Physical Exam  GENERAL:  83 y.o.-year-old patient lying in bed in mild Resp. Distress.  EYES: Pupils equal, round, reactive to light and accommodation. No scleral icterus. Extraocular muscles intact.  HEENT: Head atraumatic, normocephalic. Oropharynx and nasopharynx clear.  NECK:   Supple, no jugular venous distention. No thyroid enlargement, no tenderness.  LUNGS: Normal breath sounds bilaterally, no wheezing, rales on right mid to lower lung fields, No rhonchi. No use of accessory muscles of respiration.  CARDIOVASCULAR: Irregular rate and rhythm with tachycardia. No murmurs, rubs, or gallops.  ABDOMEN: Soft, nontender, nondistended. Bowel sounds present. No organomegaly or mass.  EXTREMITIES: No cyanosis, clubbing, has leg edema b/l. Left Hip dressing w/ wound vac in place.     NEUROLOGIC: Cranial nerves II through XII are intact. No focal Motor or sensory deficits b/l.  Globally weak.  PSYCHIATRIC: The patient is alert and oriented x 3.  SKIN: No obvious rash, lesion, or ulcer.    LABORATORY PANEL:   CBC  Recent Labs Lab 05/17/17 0500  WBC 26.5*  HGB 8.9*  HCT 27.3*  PLT 207   ------------------------------------------------------------------------------------------------------------------  Chemistries   Recent Labs Lab 05/11/2017 2327 05/15/17 0530  05/17/17 0500  NA 139 138  < > 137  K 6.1* 5.3*  < > 5.4*  CL 103 105  < > 104  CO2 24 24  < > 24  GLUCOSE 142* 97  < > 104*  BUN 81* 76*  < > 74*  CREATININE 4.02* 4.12*  < > 3.43*  CALCIUM 8.6* 8.0*  < > 8.4*  MG 2.6* 2.8*  --   --   AST 20  --   --   --   ALT 12*  --   --   --   ALKPHOS 86  --   --   --   BILITOT 0.9  --   --   --   < > =  values in this interval not displayed. ------------------------------------------------------------------------------------------------------------------  Cardiac Enzymes  Recent Labs Lab 05/15/17 0530  TROPONINI 0.06*   ------------------------------------------------------------------------------------------------------------------  RADIOLOGY:  Dg Abd 1 View  Result Date: 05/17/2017 CLINICAL DATA:  Left lower quadrant pain and distention. EXAM: ABDOMEN - 1 VIEW COMPARISON:  None. FINDINGS: Severe fecal loading seen within the colon. No bowel  obstruction noted. Postsurgical changes seen in the left hip. No other acute abnormalities are identified. Bilateral pleural effusions and pulmonary edema again and noted. IMPRESSION: 1. Severe fecal loading in the colon. 2. Pulmonary edema and small bilateral pleural effusions. Electronically Signed   By: Gerome Samavid  Williams III M.D   On: 05/17/2017 16:34   Dg Chest Port 1 View  Result Date: 05/16/2017 CLINICAL DATA:  Respiratory failure. EXAM: PORTABLE CHEST 1 VIEW COMPARISON:  05/15/2017. FINDINGS: Prior CABG and cardiac valve replacement. Small metallic density noted over the lower mid chest is unchanged in position. Stable cardiomegaly. Persistent but slightly improved bilateral pulmonary interstitial prominence. Bilateral pleural effusions are stable. No pneumothorax. IMPRESSION: Prior CABG and cardiac valve replacement. Congestive heart failure with interstitial edema, slightly improved from prior exam. Stable bilateral pleural effusions. Electronically Signed   By: Maisie Fushomas  Register   On: 05/16/2017 06:33     ASSESSMENT AND PLAN:   83 year old female with past medical history of chronic afibrillation, history of coronary artery disease status post bypass, history of mitral levels placement, chronic respiratory failure, acute systolic CHF who presented to the hospital after a fall and noted to have left hip fracture.  1. Status post fall and left hip fracture-seen by orthopedics and is status post left hip hemiarthroplasty postoperative day #6 today. Temp dialysis cath was removed. PT evaluation suggested SNF.   2. Atrial fibrillation with rapid ventricular response-Patient went into A. fib with RVR and had to be transferred to the intensive care unit and placed on amiodarone drip. Off the drip and switched over to oral amiodarone. Continue metoprolol, continue to hold Coumadin and INR is therapeutic. Hold to get perm cath.  3. Acute kidney injury-baseline creatinine around 1.2-1.3. Suspect this  is ATN from hypotension -Did not respond to fluids, therefore started on hemodialysis. Nephrology following, and remains oliguric and volume overloaded. She got hemodialysis 3 times so far.  Per Dr. Thedore MinsSingh, depending on labs and resp status, if she needs more dialysis prefer IJ permcath  Or IJ temp dialysis cathter - Due comorbidities, she may have worsning quality of life with chronic dialysis.  4. Pleural effusion-patient's  Status post right-sided ultrasound-guided thoracentesis with 850 cc of fluid removed. Stable pleural effusion per CXR.   5. COPD-no acute exacerbation-continue albuterol nebulizers, prednisone, Spiriva.  6. GERD-continue Protonix.  7. Acute blood loss anemia-secondary to recent surgery. -Transfused 1 unit of packed red blood cells and hemoglobin is stable.   8. Constipation-continue MiraLAX, Senokot.  Hypotension. Continue phenylephrine drip.   Poor prognosis, palliative care staff on board. But the patient's  family members want aggressive treatment.  I discussed with Dr. Thedore MinsSingh. All the records are reviewed and case discussed with Care Management/Social Worker. Management plans discussed with the patient, family and they are in agreement.  CODE STATUS: Full  DVT Prophylaxis: Coumadin on hold  TOTAL TIME TAKING CARE OF THIS PATIENT: 37 minutes.   POSSIBLE D/C IN ? DAYS, DEPENDING ON CLINICAL CONDITION.   Shaune Pollackhen, Cobie Leidner M.D on 05/17/2017 at 6:07 PM  Between 7am to 6pm - Pager - [phone]  After 6pm go to [email or web address] - password [email or web address]  [employer] [facility]  Office  [phone]  CC: Primary care physician; Elita QuickHill, [facility]

## 2017-05-17 NOTE — Progress Notes (Signed)
PT Cancellation Note  Patient Details Name: Cindy Weaver W Roulhac MRN: [id number] DOB: 09/07/1926   Cancelled Treatment:    Reason Eval/Treat Not Completed: Other (comment). Treatment attempted; pt reports low left quadrant pain and is requesting medication from nursing. Pt moaning/writhing head in pain. Heart rate noted to be elevated 133-142 beats per minute. Pt refuses therapy at this time. Family member seeking nurse at this time. Re attempt at a later time/date as the schedule allows.    Scot DockHeidi E Aneyah Lortz, PTA 05/17/2017, 2:11 PM

## 2017-05-17 NOTE — Progress Notes (Signed)
OT Cancellation Note  Patient Details Name: Cindy Weaver MRN: [id number] DOB: 09/12/1926   Cancelled Treatment:    Reason Eval/Treat Not Completed: Medical issues which prohibited therapy. Pt's potassium elevated this morning, 5.4. Per therapy protocol, pt contraindicated for OT at this time (>5.1). Will continue to follow acutely and re-attempt OT treatment at later date/time as pt is medically appropriate.  Richrd PrimeJamie Stiller, MPH, MS, OTR/L ascom [phone] 05/17/17, 11:44 AM

## 2017-05-18 ENCOUNTER — Inpatient Hospital Stay: Payer: Medicare Other

## 2017-05-18 LAB — PROTIME-INR
INR: 2.24
PROTHROMBIN TIME: 25.2 s — AB (ref 11.4–15.2)

## 2017-05-18 MED ORDER — AMIODARONE IV BOLUS ONLY 150 MG/100ML
150.0000 mg | Freq: Once | INTRAVENOUS | Status: AC
  Administered 2017-05-18: 150 mg via INTRAVENOUS
  Filled 2017-05-18: qty 100

## 2017-05-18 MED ORDER — NOREPINEPHRINE BITARTRATE 1 MG/ML IV SOLN
0.0000 ug/min | INTRAVENOUS | Status: DC
  Administered 2017-05-18: 15 ug/min via INTRAVENOUS
  Filled 2017-05-18 (×2): qty 4

## 2017-05-18 MED ORDER — MORPHINE SULFATE (PF) 2 MG/ML IV SOLN
2.0000 mg | INTRAVENOUS | Status: AC
  Administered 2017-05-18: 2 mg via INTRAVENOUS
  Filled 2017-05-18: qty 1

## 2017-05-18 MED ORDER — METOPROLOL TARTRATE 5 MG/5ML IV SOLN: 2.5000 mg | INTRAVENOUS | Status: DC | PRN

## 2017-05-18 MED ORDER — MORPHINE SULFATE (PF) 2 MG/ML IV SOLN
1.0000 mg | INTRAVENOUS | Status: DC | PRN
  Administered 2017-05-18: 2 mg via INTRAVENOUS
  Filled 2017-05-18: qty 1

## 2017-05-18 MED ORDER — LORAZEPAM 2 MG/ML IJ SOLN: 1.0000 mg | INTRAMUSCULAR | Status: DC | PRN

## 2017-05-18 MED ORDER — MORPHINE SULFATE (PF) 2 MG/ML IV SOLN: 1.0000 mg | INTRAVENOUS | Status: DC

## 2017-05-18 MED ORDER — METOPROLOL TARTRATE 5 MG/5ML IV SOLN
INTRAVENOUS | Status: AC
  Administered 2017-05-18: 5 mg
  Filled 2017-05-18: qty 5

## 2017-05-18 MED ORDER — METOPROLOL TARTRATE 5 MG/5ML IV SOLN
2.5000 mg | Freq: Once | INTRAVENOUS | Status: AC
  Administered 2017-05-18: 2.5 mg via INTRAVENOUS
  Filled 2017-05-18: qty 5

## 2017-05-18 MED ORDER — SODIUM CHLORIDE 0.9 % IV SOLN
0.0000 ug/min | INTRAVENOUS | Status: DC
  Administered 2017-05-18 (×2): 400 ug/min via INTRAVENOUS
  Filled 2017-05-18 (×3): qty 4

## 2017-05-18 MED ORDER — SODIUM CHLORIDE 0.9 % IV BOLUS (SEPSIS)
500.0000 mL | Freq: Once | INTRAVENOUS | Status: AC
  Administered 2017-05-18: 500 mL via INTRAVENOUS

## 2017-05-18 MED ORDER — NOREPINEPHRINE BITARTRATE 1 MG/ML IV SOLN
0.0000 ug/min | INTRAVENOUS | Status: DC
  Administered 2017-05-18: 25 ug/min via INTRAVENOUS
  Filled 2017-05-18: qty 4

## 2017-05-18 MED ORDER — MORPHINE SULFATE (PF) 2 MG/ML IV SOLN: 1.0000 mg | INTRAVENOUS | Status: DC | PRN

## 2017-05-18 MED ORDER — GLYCOPYRROLATE 0.2 MG/ML IJ SOLN: 0.2000 mg | INTRAMUSCULAR | Status: DC | PRN

## 2017-05-19 LAB — CULTURE, BODY FLUID W GRAM STAIN -BOTTLE: Culture: NO GROWTH

## 2017-05-30 ENCOUNTER — Ambulatory Visit: Payer: Medicare Other | Admitting: Family

## 2017-06-16 NOTE — Consult Note (Signed)
ORTHOPAEDICS PROGRESS NOTE  PATIENT NAME: Cindy Weaver DOB: 06/24/1926  MRN: [id number]  POD # 7: Left hip hemiarthroplasty for femoral neck fracture  Subjective: Patient was complaining of abdominal pain yesterday and this AM. Not as alert this AM. Appears uncomfortable. Apparently refused attempts to dangle at bedside yesterday.  Back on pressors due to hypotension.  Objective: Vital signs in last 24 hours: Temp:  [97.4 F (36.3 C)-98.5 F (36.9 C)] 98.5 F (36.9 C) (08/03 0200) Pulse Rate:  [93-157] 123 (08/03 0600) Resp:  [16-27] 19 (08/03 0615) BP: (55-142)/(46-97) 70/54 (08/03 0615) SpO2:  [86 %-98 %] 93 % (08/03 0600) Weight:  [70.7 kg (155 lb 13.8 oz)] 70.7 kg (155 lb 13.8 oz) (08/03 0500)  Intake/Output from previous day: 08/02 0701 - 08/03 0700 In: 318.8 [I.V.:318.8] Out: 420 [Drains:420]   Recent Labs  05/16/17 0512 05/16/17 1116 05/17/17 0500 06/05/2017 0553  WBC 18.5* 17.7* 26.5*  --   HGB 9.1* 8.7* 8.9*  --   HCT 27.5* 26.7* 27.3*  --   PLT 183 155 207  --   K 5.0  --  5.4*  --   CL 104  --  104  --   CO2 27  --  24  --   BUN 62*  --  74*  --   CREATININE 3.32*  --  3.43*  --   GLUCOSE 92  --  104*  --   CALCIUM 8.5*  --  8.4*  --   INR 2.26  --  1.84 2.24   KUB: Large amount of stool throughout the colon.   EXAM General: Frail, ill-appearing female. Arouses but not as interactive as usual. Lungs: Some rhonchi; diminished breath sounds at the bases Cardiac: Irregular rate Abdomen: Distended and firm; hypoactive bowel sounds Left lower extremity: Wound Vac in place. No significant tenderness. No apparent erythema. Neurologic: More lethargic. Sensory and motor difficult to assess.  Assessment: Left hip hemiarthroplasty for femoral neck fracture  Secondary diagnoses: S/p aortic valve replacement CAD Chronic systolic CHF Atrial fibrillation Acute renal failure  Anemia Severe constipation  Plan: Consider IV Tylenol for pain control  to avoid dropping blood pressure and minimize GI motility issues. Recommend enema and/or other intervention to promote BM. Try to mobilize with PT.  Illene LabradorJames P. Angie FavaHooten, Jr. M.D.

## 2017-06-16 NOTE — Progress Notes (Signed)
Patient expired at [id number]. Family at bedside. This RN and Agricultural consultantBrad Chrismon RN verified for heart beat. Sonda Rumbleana Blakeney, NP notified of expiration. Comfort measures provided to the family. CDS notified.

## 2017-06-16 NOTE — Clinical Social Work Note (Signed)
Patient's disposition has changed and is now being made comfort care according to RN CM. York SpanielMonica Alberto Schoch MSW,LCSW [phone]

## 2017-06-16 NOTE — Progress Notes (Addendum)
[facility] - [place] at [facility]   PATIENT NAME: Cindy Weaver    MR#:  [id number]  DATE OF BIRTH:  03/27/1926  SUBJECTIVE:   Status post left hip hemiarthroplasty, postop day #6 today.  Transferred back to ICU overnight due to low BP and a. Fib w/ RVR on amio gtt. Weaned off amio gtt.  The patient is still hypotensive with max phenylephrine drip. Afib RVR at 120-140's. She is transitioned to comfort care.  REVIEW OF SYSTEMS:    Review of Systems  Unable to perform ROS: Critical illness     DRUG ALLERGIES:   Allergies  Allergen Reactions  . Penicillins Itching and Rash    Other reaction(s): RASH  . Enalapril Other (See Comments)    cough  . Statins Other (See Comments)    Other reaction(s): MUSCLE PAIN    VITALS:  Blood pressure (!) 81/63, pulse (!) 140, temperature 98.5 F (36.9 C), temperature source Axillary, resp. rate 19, height 5\' 8"  (1.727 m), weight 155 lb 13.8 oz (70.7 kg), SpO2 95 %.  PHYSICAL EXAMINATION:   Physical Exam  GENERAL:  83 y.o.-year-old patient lying in bed, Unresponsive.  SKIN: No obvious rash, lesion, or ulcer.   LABORATORY PANEL:   CBC  Recent Labs Lab 05/17/17 0500  WBC 26.5*  HGB 8.9*  HCT 27.3*  PLT 207   ------------------------------------------------------------------------------------------------------------------  Chemistries   Recent Labs Lab July 17, 2017 2327 05/15/17 0530  05/17/17 0500  NA 139 138  < > 137  K 6.1* 5.3*  < > 5.4*  CL 103 105  < > 104  CO2 24 24  < > 24  GLUCOSE 142* 97  < > 104*  BUN 81* 76*  < > 74*  CREATININE 4.02* 4.12*  < > 3.43*  CALCIUM 8.6* 8.0*  < > 8.4*  MG 2.6* 2.8*  --   --   AST 20  --   --   --   ALT 12*  --   --   --   ALKPHOS 86  --   --   --   BILITOT 0.9  --   --   --   < > = values in this interval not displayed. ------------------------------------------------------------------------------------------------------------------  Cardiac Enzymes  Recent  Labs Lab 05/15/17 0530  TROPONINI 0.06*   ------------------------------------------------------------------------------------------------------------------  RADIOLOGY:  Dg Abd 1 View  Result Date: 05/17/2017 CLINICAL DATA:  Left lower quadrant pain and distention. EXAM: ABDOMEN - 1 VIEW COMPARISON:  None. FINDINGS: Severe fecal loading seen within the colon. No bowel obstruction noted. Postsurgical changes seen in the left hip. No other acute abnormalities are identified. Bilateral pleural effusions and pulmonary edema again and noted. IMPRESSION: 1. Severe fecal loading in the colon. 2. Pulmonary edema and small bilateral pleural effusions. Electronically Signed   By: Gerome Samavid  Williams III M.D   On: 05/17/2017 16:34   Dg Chest Port 1 View  Result Date: 05/29/2017 CLINICAL DATA:  83 year old female with hypotension, respiratory distress EXAM: PORTABLE CHEST 1 VIEW COMPARISON:  Prior chest x-ray 05/16/2017 FINDINGS: Stable cardiomegaly. Atherosclerotic calcifications again noted in the transverse aorta. Patient is status post median sternotomy with evidence of multivessel CABG. Increasing pulmonary vascular congestion and mild pulmonary edema. Slightly enlarged bilateral layering pleural effusions with associated bibasilar atelectasis. No pneumothorax. No acute osseous abnormality. IMPRESSION: 1. Slight interval progression of CHF. 2. Slightly enlarged bilateral layering pleural effusions and associated bibasilar atelectasis. Electronically Signed   By: Malachy MoanHeath  McCullough M.D.   On: 05/16/2017  08:37     ASSESSMENT AND PLAN:   83 year old female with past medical history of chronic afibrillation, history of coronary artery disease status post bypass, history of mitral levels placement, chronic respiratory failure, acute systolic CHF who presented to the hospital after a fall and noted to have left hip fracture.  1. Status post fall and left hip fracture-seen by orthopedics and is status post left hip  hemiarthroplasty postoperative day #7 today. Temp dialysis cath was removed. PT evaluation suggested SNF.   2. Atrial fibrillation with rapid ventricular response-Patient went into A. fib with RVR and had to be transferred to the intensive care unit and placed on amiodarone drip. Off the drip and switched over to oral amiodarone. She was on metoprolol, continue to hold Coumadin and INR is therapeutic. Hold to get perm cath.  3. Acute kidney injury-baseline creatinine around 1.2-1.3. Suspect this is ATN from hypotension -Did not respond to fluids, therefore started on hemodialysis. Nephrology following, and remains oliguric and volume overloaded. She got hemodialysis 3 times so far.  Per Dr. Thedore MinsSingh, depending on labs and resp status, if she needs more dialysis prefer IJ permcath  Or IJ temp dialysis cathter - Due comorbidities, she may have worsning quality of life with chronic dialysis.  4. Pleural effusion-patient's  Status post right-sided ultrasound-guided thoracentesis with 850 cc of fluid removed. Stable pleural effusion per CXR.   5. COPD-no acute exacerbation-was on albuterol nebulizers, prednisone, Spiriva.  6. GERD-discontinued Protonix.  7. Acute blood loss anemia-secondary to recent surgery. -Transfused 1 unit of packed red blood cells and hemoglobin is stable.   8. Constipation-discontinue MiraLAX, Senokot.  Hypotension. on phenylephrine drip. Waiting for her another daughter to come, then discontinue.   On comfort care. The patient will die soon.  All the records are reviewed and case discussed with Care Management/Social Worker. Management plans discussed with the patient, her daughter and other FM and they are in agreement.  CODE STATUS: DO NOT RESUSCITATE  DVT Prophylaxis: Coumadin on hold  TOTAL TIME TAKING CARE OF THIS PATIENT: 37 minutes.   POSSIBLE D/C IN ? DAYS, DEPENDING ON CLINICAL CONDITION.   Shaune Pollackhen, Riese Hellard M.D on 05/17/2017 at 2:24 PM  Between 7am to 6pm -  Pager - [phone]  After 6pm go to [email or web address] - Therapist, nutritionalpassword [email or web address]  [facility] [place] [facility]  Office  [phone]  CC: Primary care physician; Elita QuickHill, [facility]

## 2017-06-16 NOTE — Progress Notes (Signed)
Spoke with NP that patient's blood pressure on lower side but MAP maintaining but patient has been tachy in the 150s 160s and sustaining. Will order low dose metoprolol.

## 2017-06-16 NOTE — Progress Notes (Signed)
[facility]  ROUNDING NOTE   Subjective:   Presented from home with fall 7/18. Left hip hemiarthroplasty 7/27  Underwent RIght thoracentesis on 7/30; . 850 cc of clear fluid was removed. hosp course complicated with A Fib with RVR. Dialysis started 7/24 HD done on 7/31 and dialysis cathter was removed 8/1  Clinically worse today. Hypotensive, dyspneic with NRB O2 mask Requiring pressors + edema Family at bedside   Objective:  Vital signs in last 24 hours:  Temp:  [97.4 F (36.3 C)-98.5 F (36.9 C)] 98.5 F (36.9 C) (08/03 0830) Pulse Rate:  [93-160] 125 (08/03 0835) Resp:  [16-27] 19 (08/03 0835) BP: (54-142)/(26-97) 94/56 (08/03 0835) SpO2:  [78 %-98 %] 97 % (08/03 0835) Weight:  [70.7 kg (155 lb 13.8 oz)] 70.7 kg (155 lb 13.8 oz) (08/03 0500)   Weight change: -3.6 kg (-7 lb 15 oz) Filed Weights   05/16/17 0500 05/17/17 0500 06-15-2017 0500  Weight: 74.4 kg (164 lb 0.4 oz) 74.3 kg (163 lb 12.8 oz) 70.7 kg (155 lb 13.8 oz)    Intake/Output: I/O last 3 completed shifts: In: 1068.8 [I.V.:1068.8] Out: 470 [Drains:470]   Intake/Output this shift:  Total I/O In: 946 [I.V.:446; IV Piggyback:500] Out: -   Physical Exam: General: Critically ill appearing  Head: Moist oral mucosal membranes  Eyes: Anicteric,    Neck: Supple,   Lungs:  Rhonchi b/l. Diffuse basilar crackles  Heart: irregular  Abdomen:  Soft, nontender  Extremities: ++ dependent peripheral edema. Left hip wound vac  Neurologic: somnolent  Skin: No acute lesions         Basic Metabolic Panel:  Recent Labs Lab 05/12/17 0441  04/23/2017 0400 04/27/2017 2327 05/15/17 0530 05/16/17 0512 05/17/17 0500  NA 139  < > 137 139 138 139 137  K 4.5  < > 4.8 6.1* 5.3* 5.0 5.4*  CL 103  < > 102 103 105 104 104  CO2 26  < > _0 GLUCOSE 77  < > 98 142* 97 92 104*  BUN 42*  < > 74* 81* 76* 62* 74*  CREATININE 2.59*  < > 3.68* 4.02* 4.12* 3.32* 3.43*  CALCIUM 8.2*  < > 8.2* 8.6* 8.0* 8.5*  8.4*  MG 2.0  --   --  2.6* 2.8*  --   --   PHOS 3.5  --   --  3.6 3.2  --   --   < > = values in this interval not displayed.  Liver Function Tests:  Recent Labs Lab 05/03/2017 2327  AST 20  ALT 12*  ALKPHOS 86  BILITOT 0.9  PROT 5.4*  ALBUMIN 2.2*   No results for input(s): LIPASE, AMYLASE in the last 168 hours. No results for input(s): AMMONIA in the last 168 hours.  CBC:  Recent Labs Lab 05/04/2017 2327 05/15/17 0530 05/16/17 0512 05/16/17 1116 05/17/17 0500  WBC 19.6* 16.8* 18.5* 17.7* 26.5*  HGB 9.5* 9.4* 9.1* 8.7* 8.9*  HCT 28.5* 28.2* 27.5* 26.7* 27.3*  MCV 89.4 90.3 90.0 92.7 92.5  PLT 200 177 183 155 207    Cardiac Enzymes:  Recent Labs Lab 05/15/2017 2327 05/15/17 0530  TROPONINI 0.06* 0.06*    BNP: Invalid input(s): POCBNP  CBG:  Recent Labs Lab 04/30/2017 1638 05/12/17 1614 05/15/17 0037  GLUCAP 83 117* 136*    Microbiology: Results for orders placed or performed during the hospital encounter of 04/26/2017  MRSA PCR Screening     Status: None  Collection Time: 05/04/17  8:27 AM  Result Value Ref Range Status   MRSA by PCR NEGATIVE NEGATIVE Final    Comment:        The GeneXpert MRSA Assay (FDA approved for NASAL specimens only), is one component of a comprehensive MRSA colonization surveillance program. It is not intended to diagnose MRSA infection nor to guide or monitor treatment for MRSA infections.   Culture, expectorated sputum-assessment     Status: None   Collection Time: 05/05/17 10:55 AM  Result Value Ref Range Status   Specimen Description SPUTUM  Final   Special Requests Normal  Final   Sputum evaluation THIS SPECIMEN IS ACCEPTABLE FOR SPUTUM CULTURE  Final   Report Status 05/05/2017 FINAL  Final  Culture, respiratory (NON-Expectorated)     Status: None   Collection Time: 05/05/17 10:55 AM  Result Value Ref Range Status   Specimen Description SPUTUM  Final   Special Requests Normal Reflexed from [id number]  Final   Gram  Stain   Final    FEW WBC PRESENT, PREDOMINANTLY PMN FEW GRAM NEGATIVE RODS FEW GRAM POSITIVE COCCI IN PAIRS FEW GRAM POSITIVE RODS RARE BUDDING YEAST SEEN    Culture   Final    Consistent with normal respiratory flora. Performed at [facility], [place] [address] [place], [place] [address]    Report Status 05/06/2017 FINAL  Final  Culture, body fluid-bottle     Status: None (Preliminary result)   Collection Time: 04/29/2017 11:25 AM  Result Value Ref Range Status   Specimen Description FLUID PLEURAL  Final   Special Requests BOTTLES DRAWN AEROBIC ONLY  Final   Culture   Final    NO GROWTH 3 DAYS Performed at [facility], [place] [address], [place], [place] [address]    Report Status PENDING  Incomplete  Gram stain     Status: None   Collection Time: 05/09/2017 11:25 AM  Result Value Ref Range Status   Specimen Description FLUID PLEURAL  Final   Special Requests NONE  Final   Gram Stain   Final    CYTOSPIN SMEAR WBC PRESENT,BOTH PMN AND MONONUCLEAR NO ORGANISMS SEEN    Report Status 05/01/2017 FINAL  Final    Coagulation Studies:  Recent Labs  05/16/17 0512 05/17/17 0500 06-16-17 0553  LABPROT 25.3* 21.5* 25.2*  INR 2.26 1.84 2.24    Urinalysis: No results for input(s): COLORURINE, LABSPEC, PHURINE, GLUCOSEU, HGBUR, BILIRUBINUR, KETONESUR, PROTEINUR, UROBILINOGEN, NITRITE, LEUKOCYTESUR in the last 72 hours.  Invalid input(s): APPERANCEUR    Imaging: Dg Abd 1 View  Result Date: 05/17/2017 CLINICAL DATA:  Left lower quadrant pain and distention. EXAM: ABDOMEN - 1 VIEW COMPARISON:  None. FINDINGS: Severe fecal loading seen within the colon. No bowel obstruction noted. Postsurgical changes seen in the left hip. No other acute abnormalities are identified. Bilateral pleural effusions and pulmonary edema again and noted. IMPRESSION: 1. Severe fecal loading in the colon. 2. Pulmonary edema and small bilateral pleural effusions. Electronically Signed   By: Dorise Bullion III M.D   On: 05/17/2017 16:34   Dg Chest Port 1 View  Result Date: Jun 16, 2017 CLINICAL DATA:  83 year old female with hypotension, respiratory distress EXAM: PORTABLE CHEST 1 VIEW COMPARISON:  Prior chest x-ray 05/16/2017 FINDINGS: Stable cardiomegaly. Atherosclerotic calcifications again noted in the transverse aorta. Patient is status post median sternotomy with evidence of multivessel CABG. Increasing pulmonary vascular congestion and mild pulmonary edema. Slightly enlarged bilateral layering pleural effusions with associated bibasilar atelectasis.  No pneumothorax. No acute osseous abnormality. IMPRESSION: 1. Slight interval progression of CHF. 2. Slightly enlarged bilateral layering pleural effusions and associated bibasilar atelectasis. Electronically Signed   By: Jacqulynn Cadet M.D.   On: June 03, 2017 08:37     Medications:   . norepinephrine (LEVOPHED) Adult infusion 15 mcg/min (03-Jun-2017 0844)  . phenylephrine (NEO-SYNEPHRINE) Adult infusion 300 mcg/min (06-03-2017 0727)   . albuterol  2.5 mg Nebulization TID  . amiodarone  200 mg Oral Daily  . budesonide (PULMICORT) nebulizer solution  0.5 mg Nebulization BID  . chlorhexidine  15 mL Mouth Rinse BID  . feeding supplement (ENSURE ENLIVE)  237 mL Oral TID BM  . ferrous sulfate  325 mg Oral Q breakfast  . mouth rinse  15 mL Mouth Rinse q12n4p  . midodrine  5 mg Oral TID WC  .  morphine injection  1 mg Intravenous NOW  .  morphine injection  2 mg Intravenous STAT  . multivitamin with minerals  1 tablet Oral Daily  . pantoprazole  40 mg Oral Daily  . phytonadione  0.2 mg Oral Daily  . polyethylene glycol  17 g Oral Daily  . senna-docusate  2 tablet Oral BID   acetaminophen **OR** acetaminophen, bisacodyl, guaiFENesin, magnesium hydroxide, [DISCONTINUED] metoCLOPramide **OR** metoCLOPramide (REGLAN) injection, [DISCONTINUED] ondansetron **OR** ondansetron (ZOFRAN) IV, pneumococcal 23 valent vaccine, traMADol  Assessment/ Plan:   Cindy Weaver is a 83 y.o. black female with Chronic kidney disease stage III baseline EGFR 52, congestive heart failure, hyperlipidemia, disease status post CABG, anemia of chronic kidney disease, chronic atrial fibrillation, severe mitral regurgitation, hypertension, GERD, COPD,   Hospital course, patient was admitted to [facility] on 05/08/2017 for evaluation of Fall with left hip fracture and also had Atrial fibrillation with RVR and acute renal failure. Hip surgery was postponed until 7/27 due to coagulopathy with elevated INR. Taken off warfarin, restarted after surgery. Now on heparin gtt. Started HD 7/24.   1. Acute renal failure on chronic kidney disease stage III: baseline creatinine 1.18, GFR of 52 from 04/01/17 2 Acute pulm edema 3. LE edema 4. Hypotension  Required Hemodialysis treatment on 7/24, 7/26, 7/31 clinically patients condition has deteriorated significantly She is now hypotensive requiring pressors. Respiratory status is significantly worse Family and ICU team are contemplating comfort measures  - Agree with DNR and comfort measures       LOS: 16 Montrice Montuori 08/19/189:16 AM

## 2017-06-16 NOTE — Progress Notes (Signed)
Pt declined overnight currently on levophed and neo-synephrine gtts doses maxed out, however she remains hypotensive.  She is agitated and lethargic currently requiring non rebreather mask attempting to pull non rebreather mask off.  I informed pts  daughter and other family members at bedside the pt is actively dying despite aggressive treatment.  Therefore, discussed goals of treatment and code status with pts daughter and other family members at the bedside the pts daughter stated "she does not want her mother to suffer any longer".  The pt has another daughter who is currently traveling from [place], therefore per family request will continue vasopressor therapy at current dose for now until pts daughter [place] however they are agreeable to transitioning pt to comfort care measures and changing code status to DO NOT RESUSCITATE.  They are also in agreement with the administration of pain medication as well as antianxiety medication as needed to ensure pt is comfortable.  Sonda Rumbleana Blakeney, AGNP  Pulmonary/Critical Care Pager [phone] (please enter 7 digits) PCCM Consult Pager [phone] (please enter 7 digits)   Billy Fischeravid Simonds, MD PCCM service Mobile [phone] Pager [phone] 05/25/2017 1:14 PM

## 2017-06-16 NOTE — Progress Notes (Signed)
Pt was restarted on Neo gtt, administered amiodarone bolus. Denies pain so morphine was held. Had a dose of po Tramadol for discomfort. Has been restless and throwing arms up in the air. Unable to have a BM and did not urinate this shift. Daughter was at bedside all night and aware of changes with patient.

## 2017-06-16 NOTE — Progress Notes (Signed)
Alerted MD Simonds that patient oxygen saturation had dropped into 70's, placed on non-rebreather. Blood pressure low, maximum dose of norepinephrine infusing. MD Simonds to bedside, see new orders. Will continue to monitor patient.

## 2017-06-16 NOTE — Death Summary Note (Signed)
DEATH SUMMARY   Patient Details  Name: Cindy Weaver MRN: [id number] DOB: 09-27-1926  Admission/Discharge Information   Admit Date:  May 03, 2017  Date of Death: Date of Death: 05/19/2017  Time of Death: Time of Death: Feb 22, 1422  Length of Stay: 03/02/2023  Referring Physician: [facility]) for Hospitalization  Fall   Diagnoses  Preliminary cause of death: Multiorgan Failure Secondary Diagnoses (including complications and co-morbidities):  Principal Problem:   Hip fracture (HCC) Active Problems:   Anemia   Atrial fibrillation with RVR (HCC)   Acute on chronic systolic CHF (congestive heart failure) (HCC)   Acute on chronic respiratory failure with hypoxia (HCC)   Status post aortic valve replacement   Status post mitral valve repair   AKI (acute kidney injury) (HCC)   History of Coumadin therapy   Elevated troponin   Hypotension   Malnutrition of moderate degree   Acute renal failure (ARF) (HCC)   Goals of care, counseling/discussion   Palliative care encounter   Cough   Non-intractable vomiting with nausea   Pressure injury of skin   Closed nondisplaced intertrochanteric fracture of left femur [facility])   Brief Hospital Course (including significant findings, care, treatment, and services provided and events leading to death)  Cindy Weaver is a 83 y.o. year old female admitted to the telemetry unit 05-04-2023 due to a displaced left femoral neck fracture following a fall at home.  Hospital stay complicated due to acute on chronic renal failure, development of atrial fibrillation with RVR requiring amiodarone gtt, hyperkalemia, and coagulopathy with elevated INR secondary to coumadin.  She was taken to the OR 07/27 for a left hip hemiarthroplasty, however postop she was transferred to ICU due to acute hypoxic respiratory failure secondary to pulmonary edema and bilateral pleural effusions. Her respiratory status improved following lasix administration and she was  transferred out of ICU to the [facility] unit on 07/29.  A right sided thoracentesis was performed 07/30 with removal of 850 ml of pleural fluid.  However, due to recurrent afibb with RVR and hypotension she was transferred back to ICU for vasopressor therapy and initiation of an amiodarone gtt.  Due to worsening acute on chronic renal failure and hyperkalemia she required initiation of intermittent hemodialysis on 07/24 until 07/31 with improvement of respiratory status, therefore temporary HD catheter removed.  However, despite aggressive treatment she continued to decline due to atrial fibrillation, hypotension, acute on chronic renal failure, profound weakness and poor PO intake.  On 05-20-23 pt developed profound hypotension requiring 2 vasopressors, worsening acute hypoxic respiratory failure requiring non rebreather mask, and acute encephalopathy.  Despite aggressive treatment pt continued to decline and after further discussions with pts family the decision was made per family request to change code status to DO NOT RESUSCITATE and transition pt to comfort care measures only. The pt expired on 2017/05/19 at 14:23 with family at bedside.    Pertinent Labs and Studies  Significant Diagnostic Studies Dg Chest 1 View  Result Date: 04/15/2017 CLINICAL DATA:  Right pleural effusion. EXAM: CHEST 1 VIEW COMPARISON:  Radiograph of May 13, 2017. FINDINGS: Stable cardiomegaly. Atherosclerosis of thoracic aorta is noted. Stable mild diffuse interstitial densities are noted throughout both lungs most consistent with scarring, although superimposed edema or inflammation cannot be excluded. No pneumothorax is noted. Minimal bilateral pleural effusions are noted. Sternotomy wires are noted. IMPRESSION: Aortic atherosclerosis. Stable mild diffuse interstitial densities throughout both lungs most consistent with scarring, although superimposed edema or  inflammation cannot be excluded. Minimal bilateral pleural effusions.  Electronically Signed   By: Lupita RaiderJames  Green Jr, M.D.   On: 01-23-2017 12:14   Dg Chest 1 View  Result Date: 05/13/2017 CLINICAL DATA:  Shortness of Breath EXAM: CHEST 1 VIEW COMPARISON:  05/12/2017 FINDINGS: Prior CABG. Cardiomegaly. Diffuse interstitial prominence throughout the lungs compatible with interstitial edema. Bilateral pleural effusions, right greater than left. Bibasilar atelectasis. IMPRESSION: Continued interstitial prominence, likely edema. Bilateral effusions, right greater than left with bibasilar atelectasis. Electronically Signed   By: Charlett NoseKevin  Dover M.D.   On: 05/13/2017 07:35   Dg Abd 1 View  Result Date: 05/17/2017 CLINICAL DATA:  Left lower quadrant pain and distention. EXAM: ABDOMEN - 1 VIEW COMPARISON:  None. FINDINGS: Severe fecal loading seen within the colon. No bowel obstruction noted. Postsurgical changes seen in the left hip. No other acute abnormalities are identified. Bilateral pleural effusions and pulmonary edema again and noted. IMPRESSION: 1. Severe fecal loading in the colon. 2. Pulmonary edema and small bilateral pleural effusions. Electronically Signed   By: Gerome Samavid  Williams III M.D   On: 05/17/2017 16:34   [place] Renal  Result Date: 05/09/2017 CLINICAL DATA:  83 year old female with history of acute renal failure. EXAM: RENAL / URINARY TRACT ULTRASOUND COMPLETE COMPARISON:  None. FINDINGS: Right Kidney: Length: 9.7 cm. Echogenicity within normal limits. Severe cortical thinning. No hydronephrosis. Trace amount of perinephric fluid. Two small anechoic lesions with increased through transmission in the upper and interpolar regions, measuring up to 1.5 x 1.3 x 1.9 cm. Left Kidney: Length: 11.2 cm. Echogenicity within normal limits. In the upper pole there is a 5.2 x 3.4 x 5.3 cm anechoic lesion with increased through transmission, compatible with a simple cyst. No mass hydronephrosis. Bladder: Completely decompressed with a Foley balloon catheter in place. IMPRESSION: 1. No  hydronephrosis. 2. Diffuse cortical thinning in the right kidney. 3. Multiple small simple cysts in both kidneys. Electronically Signed   By: Trudie Reedaniel  Entrikin M.D.   On: 05/01/2017 11:10   Dg Chest Port 1 View  Result Date: 05/23/2017 CLINICAL DATA:  83 year old female with hypotension, respiratory distress EXAM: PORTABLE CHEST 1 VIEW COMPARISON:  Prior chest x-ray 05/16/2017 FINDINGS: Stable cardiomegaly. Atherosclerotic calcifications again noted in the transverse aorta. Patient is status post median sternotomy with evidence of multivessel CABG. Increasing pulmonary vascular congestion and mild pulmonary edema. Slightly enlarged bilateral layering pleural effusions with associated bibasilar atelectasis. No pneumothorax. No acute osseous abnormality. IMPRESSION: 1. Slight interval progression of CHF. 2. Slightly enlarged bilateral layering pleural effusions and associated bibasilar atelectasis. Electronically Signed   By: Malachy MoanHeath  McCullough M.D.   On: 06/01/2017 08:37   Dg Chest Port 1 View  Result Date: 05/16/2017 CLINICAL DATA:  Respiratory failure. EXAM: PORTABLE CHEST 1 VIEW COMPARISON:  05/15/2017. FINDINGS: Prior CABG and cardiac valve replacement. Small metallic density noted over the lower mid chest is unchanged in position. Stable cardiomegaly. Persistent but slightly improved bilateral pulmonary interstitial prominence. Bilateral pleural effusions are stable. No pneumothorax. IMPRESSION: Prior CABG and cardiac valve replacement. Congestive heart failure with interstitial edema, slightly improved from prior exam. Stable bilateral pleural effusions. Electronically Signed   By: Maisie Fushomas  Register   On: 05/16/2017 06:33   Dg Chest Port 1 View  Result Date: 05/15/2017 CLINICAL DATA:  Chronic renal failure.  Congestive heart failure EXAM: PORTABLE CHEST 1 VIEW COMPARISON:  May 14, 2017 FINDINGS: There is diffuse interstitial pulmonary edema with small pleural effusions bilaterally. There is no  appreciable airspace consolidation. There  is cardiomegaly with pulmonary venous hypertension. There is aortic atherosclerosis. Patient is status post median sternotomy. No adenopathy. Bones are osteoporotic. There is calcification in both carotid arteries. IMPRESSION: Persistent changes of congestive heart failure. No airspace consolidation. There is aortic atherosclerosis as well as calcification in both carotid arteries. Aortic Atherosclerosis (ICD10-I70.0). Electronically Signed   By: Bretta BangWilliam  Woodruff III M.D.   On: 05/15/2017 07:05   Dg Chest Port 1 View  Result Date: 04/20/2017 CLINICAL DATA:  Left hip hemiarthroplasty. Postoperative day 3. Prior thoracentesis. EXAM: PORTABLE CHEST 1 VIEW COMPARISON:  Ultrasound 04/27/2017. Chest x-ray 05/01/2017, 05/13/2017. FINDINGS: Prior CABG. Cardiomegaly with slight increase pulmonary interstitial prominence and bilateral pleural effusions. Findings suggest congestive heart failure. No pneumothorax IMPRESSION: 1. Prior CABG. Cardiomegaly with bilateral increased pulmonary interstitial prominence and bilateral pleural effusions most consistent with CHF. 2. No pneumothorax . Electronically Signed   By: Maisie Fushomas  Register   On: 04/19/2017 14:41   Dg Chest Port 1 View  Result Date: 05/12/2017 CLINICAL DATA:  Acute respiratory failure. EXAM: PORTABLE CHEST 1 VIEW COMPARISON:  05/09/2017 FINDINGS: Sequelae of prior CABG are again identified. The cardiac silhouette remains enlarged. Aortic atherosclerosis is noted. There is a moderate-sized right pleural effusion which appears to have increased in size with increasing right lower lobe opacity. A small left pleural effusion is again seen. Diffusely increased interstitial markings do not appear significantly changed. No pneumothorax is identified. IMPRESSION: 1. Enlarging right pleural effusion with worsening right basilar aeration. 2. Unchanged small left pleural effusion. 3. Unchanged interstitial densities which may  reflect mild edema superimposed on chronic interstitial lung disease. Electronically Signed   By: Sebastian AcheAllen  Grady M.D.   On: 05/12/2017 07:33   Dg Chest Port 1 View  Result Date: 05/09/2017 CLINICAL DATA:  Followup cough, congestion and wheezing. EXAM: PORTABLE CHEST 1 VIEW COMPARISON:  05/03/2017.  19-Feb-2017. FINDINGS: Lung bases excluded from the film. Previous median sternotomy. Chronic cardiomegaly and aortic atherosclerosis. Enlarged right effusion with right lung volume loss. Volume loss also at the left base. Possible small or effusion on the left. Chronic interstitial lung markings probably related to a combination of chronic lung disease and mild edema. IMPRESSION: Effusions, right larger than left, with volume loss right more than left. Cardiomegaly and aortic atherosclerosis. Chronic lung disease and likely mild interstitial edema. Electronically Signed   By: Paulina FusiMark  Shogry M.D.   On: 05/09/2017 11:14   Dg Chest Port 1 View  Result Date: 05/03/2017 CLINICAL DATA:  83 y/o female with acute onset shortness of breath today. Hypoxia. EXAM: PORTABLE CHEST 1 VIEW COMPARISON:  19-Feb-2017 and earlier. FINDINGS: Portable AP upright view at 0801 hours. Stable large lung volumes. Stable cardiomegaly and mediastinal contours. Calcified aortic atherosclerosis. Mildly increased bilateral pulmonary interstitial opacity, which appears to be acute on chronic. No pneumothorax. No definite pleural effusion. No consolidation. Negative visible bowel gas pattern. Prior sternotomy. IMPRESSION: 1. Acute on chronic bilateral pulmonary interstitial opacity has mildly increased since yesterday. Consider acute viral/atypical respiratory infection and interstitial edema. No definite pleural effusion. 2. Underlying pulmonary hyperinflation, cardiomegaly, Calcified aortic atherosclerosis. Electronically Signed   By: Odessa FlemingH  Hall M.D.   On: 05/03/2017 08:52   Dg Chest Port 1 View  Result Date: 2017-05-22 CLINICAL DATA:  Fall EXAM:  PORTABLE CHEST 1 VIEW COMPARISON:  Chest radiograph 11/24/2012 FINDINGS: Cardiomegaly is worsened from the prior study. There bilateral interstitial opacities, worst in the left lung base. No pneumothorax or sizable pleural effusion. No focal consolidation. IMPRESSION: Cardiomegaly, and calcific aortic  atherosclerosis and suspected mild interstitial edema. Electronically Signed   By: Deatra Robinson M.D.   On: 04/29/2017 03:56   Dg Hip Port Unilat With Pelvis 1v Left  Result Date: 04/17/2017 CLINICAL DATA:  Postop left total hip arthroplasty. EXAM: DG HIP (WITH OR WITHOUT PELVIS) 1V PORT LEFT COMPARISON:  05/15/2017 radiographs. FINDINGS: 1117 hours. AP pelvis and cross-table lateral view of the left hip demonstrate interval left hip bipolar hemiarthroplasty. The hardware is well positioned. No evidence of acute fracture or dislocation. There is air within the joint and soft tissues surrounding the hip. Possible right femoral line. IMPRESSION: No demonstrated complication following left hip hemiarthroplasty. Electronically Signed   By: Carey Bullocks M.D.   On: 04/19/2017 11:37   Dg Hip Unilat W Or Wo Pelvis 2-3 Views Left  Result Date: 05/03/2017 CLINICAL DATA:  Left hip pain after falling EXAM: DG HIP (WITH OR WITHOUT PELVIS) 2-3V LEFT COMPARISON:  None. FINDINGS: There is a transverse fracture of the left femoral neck with associated foreshortening. The femoral head remains approximated to the acetabulum. The right hip is unremarkable. No pelvic diastasis. IMPRESSION: Transverse fracture of the left femoral neck. Electronically Signed   By: Deatra Robinson M.D.   On: 05/10/2017 04:24   US Thoracentesis Asp Pleural Space W/img Guide  Result Date: 05/04/2017 INDICATION: Pleural effusions, larger on the right EXAM: ULTRASOUND GUIDED RIGHT THORACENTESIS MEDICATIONS: 1% lidocaine local COMPLICATIONS: None immediate. PROCEDURE: An ultrasound guided thoracentesis was thoroughly discussed with the patient and  questions answered. The benefits, risks, alternatives and complications were also discussed. The patient understands and wishes to proceed with the procedure. Written consent was obtained. Ultrasound was performed to localize and mark an adequate pocket of fluid in the right chest. The area was then prepped and draped in the normal sterile fashion. 1% Lidocaine was used for local anesthesia. Under ultrasound guidance a 6 Fr Safe-T-Centesis catheter was introduced. Thoracentesis was performed. The catheter was removed and a dressing applied. FINDINGS: A total of approximately 850 cc of clear serosanguineous fluid was removed. Samples were sent to the laboratory as requested by the clinical team. IMPRESSION: Successful ultrasound guided right thoracentesis yielding 850 cc of pleural fluid. Electronically Signed   By: Judie Petit.  Shick M.D.   On: 04/28/2017 11:33    Microbiology Recent Results (from the past 240 hour(s))  Culture, body fluid-bottle     Status: None (Preliminary result)   Collection Time: 05/15/2017 11:25 AM  Result Value Ref Range Status   Specimen Description FLUID PLEURAL  Final   Special Requests BOTTLES DRAWN AEROBIC ONLY  Final   Culture   Final    NO GROWTH 4 DAYS Performed at [facility], [address] [place], [place] [address]    Report Status PENDING  Incomplete  Gram stain     Status: None   Collection Time: 05/09/2017 11:25 AM  Result Value Ref Range Status   Specimen Description FLUID PLEURAL  Final   Special Requests NONE  Final   Gram Stain   Final    CYTOSPIN SMEAR WBC PRESENT,BOTH PMN AND MONONUCLEAR NO ORGANISMS SEEN    Report Status 04/20/2017 FINAL  Final    Lab Basic Metabolic Panel:  Recent Labs Lab 05/12/17 0441  05/07/2017 0400 05/02/2017 2327 05/15/17 0530 05/16/17 0512 05/17/17 0500  NA 139  < > 137 139 138 139 137  K 4.5  < > 4.8 6.1* 5.3* 5.0 5.4*  CL 103  < > 102 103 105 104  104  CO2 26  < > 25 24 24 27 24   GLUCOSE 77  < > 98 142* 97 92  104*  BUN 42*  < > 74* 81* 76* 62* 74*  CREATININE 2.59*  < > 3.68* 4.02* 4.12* 3.32* 3.43*  CALCIUM 8.2*  < > 8.2* 8.6* 8.0* 8.5* 8.4*  MG 2.0  --   --  2.6* 2.8*  --   --   PHOS 3.5  --   --  3.6 3.2  --   --   < > = values in this interval not displayed. Liver Function Tests:  Recent Labs Lab 04/22/2017 2327  AST 20  ALT 12*  ALKPHOS 86  BILITOT 0.9  PROT 5.4*  ALBUMIN 2.2*   No results for input(s): LIPASE, AMYLASE in the last 168 hours. No results for input(s): AMMONIA in the last 168 hours. CBC:  Recent Labs Lab 04/28/2017 2327 05/15/17 0530 05/16/17 0512 05/16/17 1116 05/17/17 0500  WBC 19.6* 16.8* 18.5* 17.7* 26.5*  HGB 9.5* 9.4* 9.1* 8.7* 8.9*  HCT 28.5* 28.2* 27.5* 26.7* 27.3*  MCV 89.4 90.3 90.0 92.7 92.5  PLT 200 177 183 155 207   Cardiac Enzymes:  Recent Labs Lab 05/06/2017 2327 05/15/17 0530  TROPONINI 0.06* 0.06*   Sepsis Labs:  Recent Labs Lab 05/03/2017 2327 05/15/17 0530 05/15/17 0640 05/16/17 0512 05/16/17 1116 05/17/17 0500  PROCALCITON 1.12 1.05  --  1.02  --   --   WBC 19.6* 16.8*  --  18.5* 17.7* 26.5*  LATICACIDVEN 2.2*  --  1.5  --   --   --     Procedures/Operations  Right Femoral Temporary Dialysis Catheter placement 07/24>>08/1 Left hip hemiarthroplasty 07/27 Right Sided Thoracentesis 07/30  Sonda Rumble, AGNP  Pulmonary/Critical Care Pager [phone] (please enter 7 digits) PCCM Consult Pager [phone] (please enter 7 digits)

## 2017-06-16 NOTE — Progress Notes (Signed)
Notified Cindy Weaver ME of patient expiring. Post hip fracture 05/03/2017 with no discharge post injury. Criteria  for ME case.

## 2017-06-16 NOTE — Progress Notes (Signed)
Informed NP that patient was medicated for pain with tramadol but it was ineffective after hour, ordered 1 x dose of 1 mg morphine.

## 2017-06-16 NOTE — Progress Notes (Signed)
PT Cancellation Note  Patient Details Name: Cindy Weaver MRN: [id number] DOB: 03/27/1926   Cancelled Treatment:    Reason Eval/Treat Not Completed: Other (comment).  PT order cancelled.  Per Nephrology note, family and ICU team are contemplating comfort measures.  Please re-consult PT if pt's medical status improves and pt becomes appropriate for physical therapy.  Hendricks LimesEmily Eual Lindstrom, PT 05/25/2017, 9:48 AM [phone]

## 2017-06-16 DEATH — deceased

## 2018-05-20 IMAGING — US US RENAL
1 series · 14 of 25 positions shown · non-contrast
Comparison: None.

CLINICAL DATA: 82-year-old female with history of acute renal
failure.

EXAM:
RENAL / URINARY TRACT ULTRASOUND COMPLETE

[Series 1: us renal · 0.26mm/px · 14 of 43 slices shown]
[im 1/43]
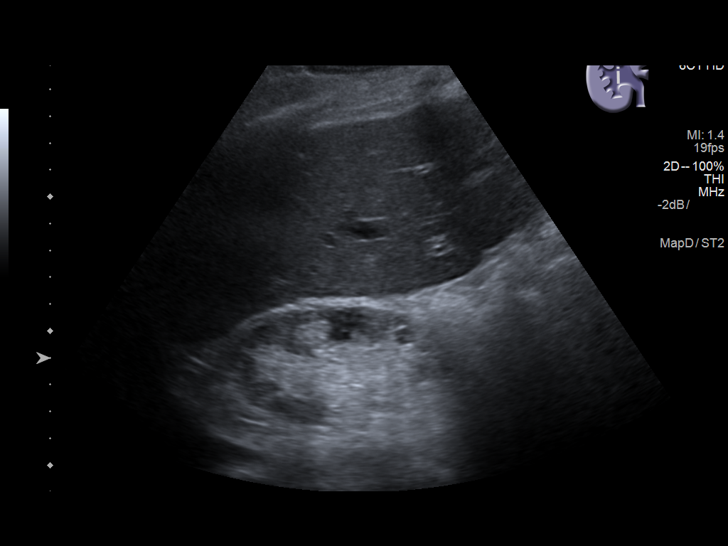
[im 4/43]
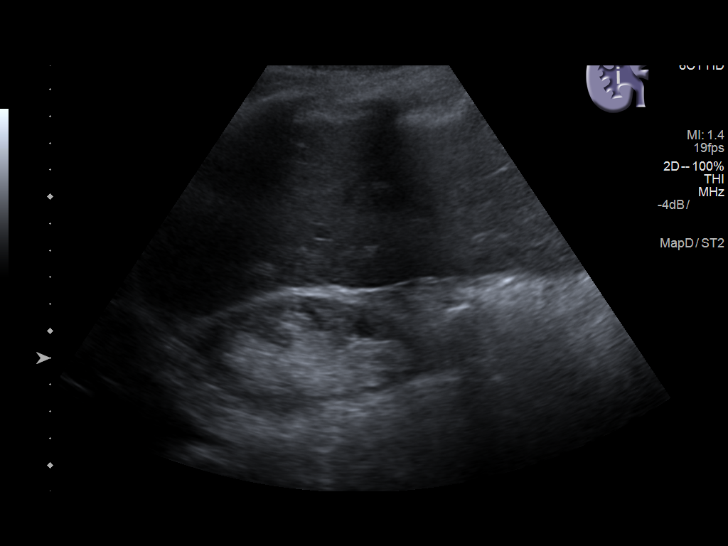
[im 8/43]
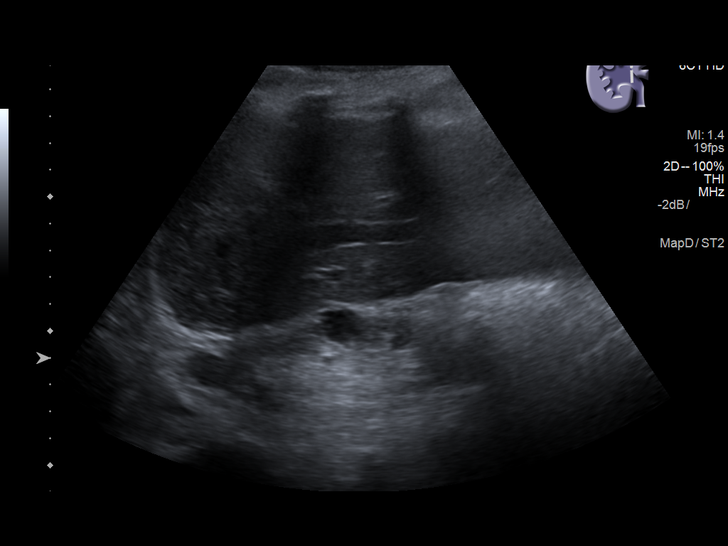
[im 11/43]
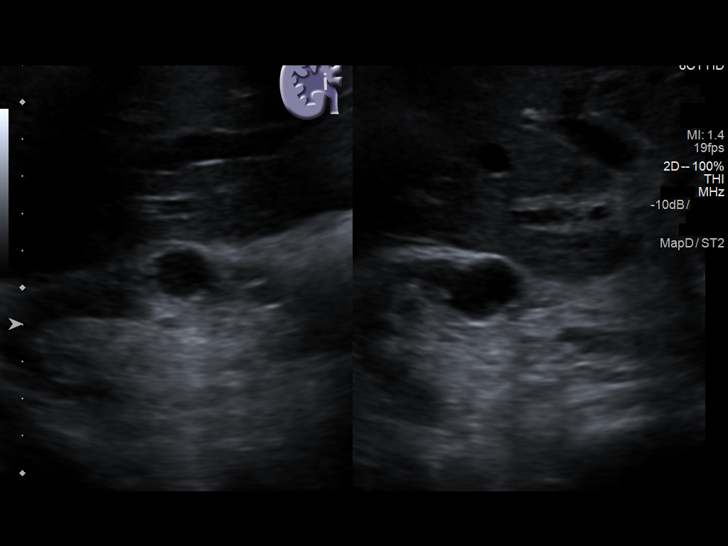
[im 15/43]
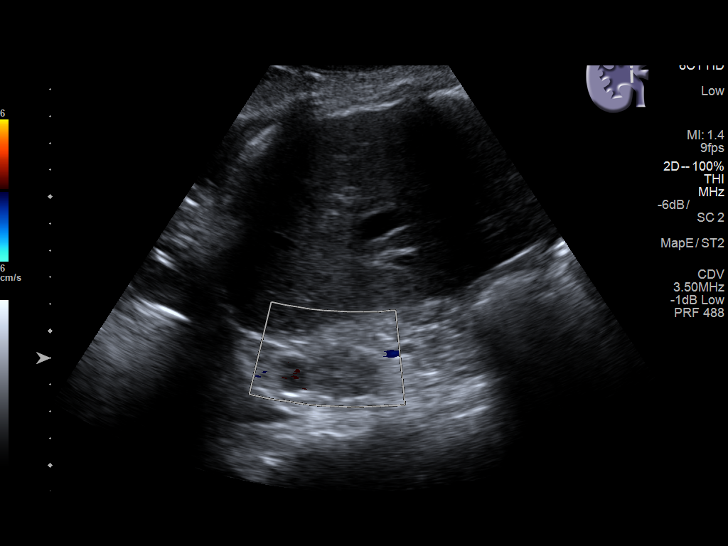
[im 16/43]
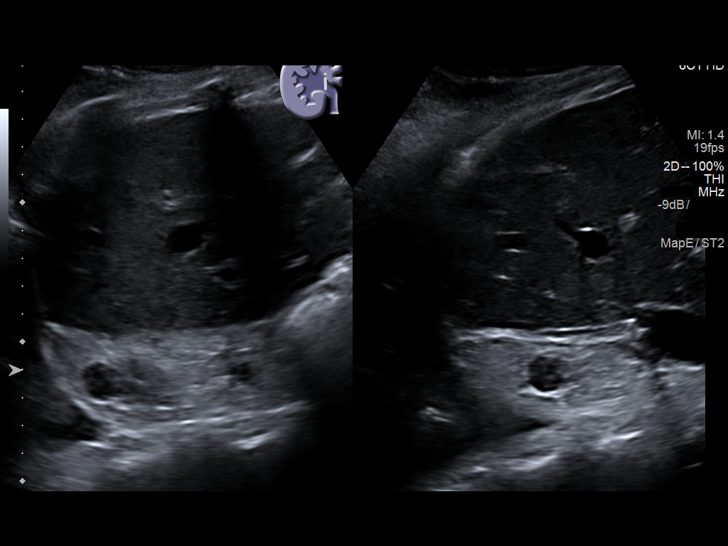
[im 20/43]
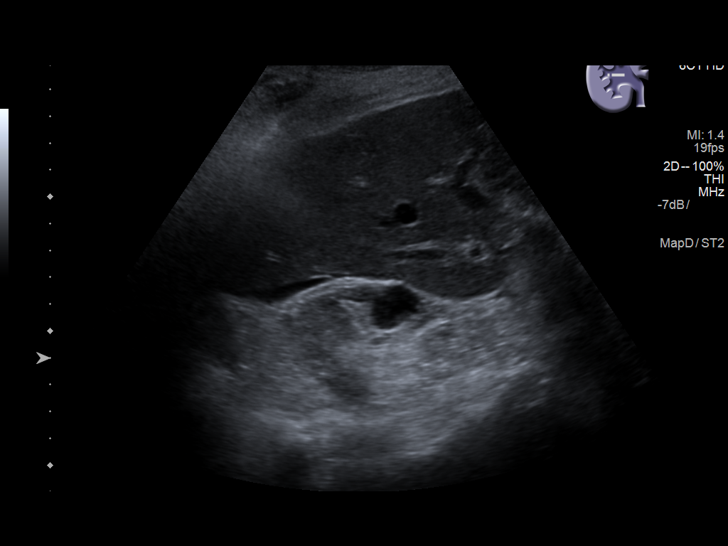
[im 23/43]
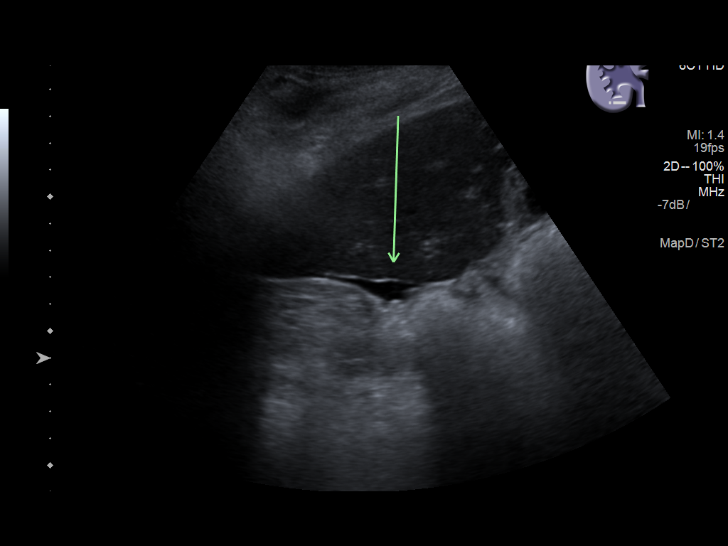
[im 27/43]
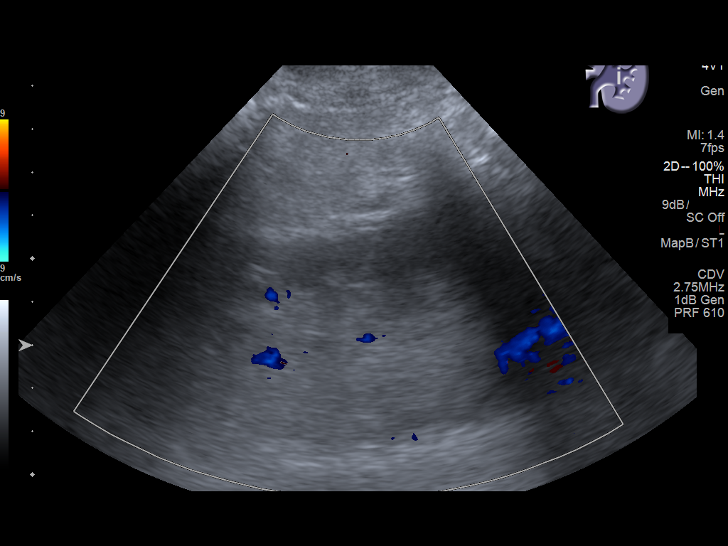
[im 29/43]
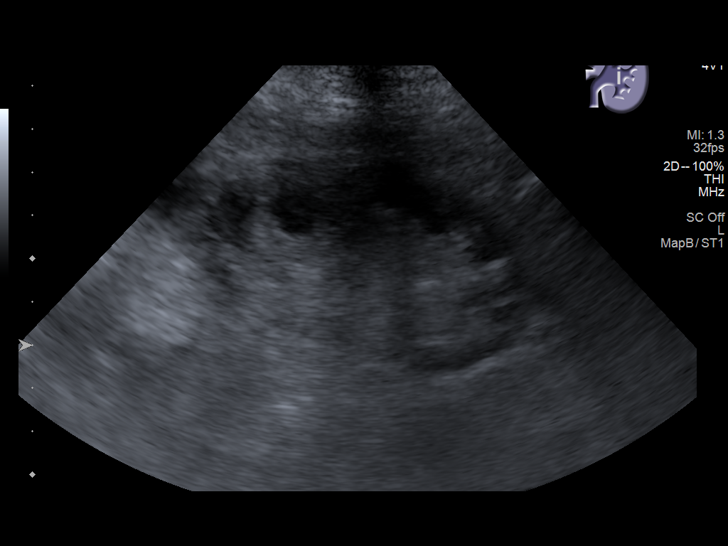
[im 32/43]
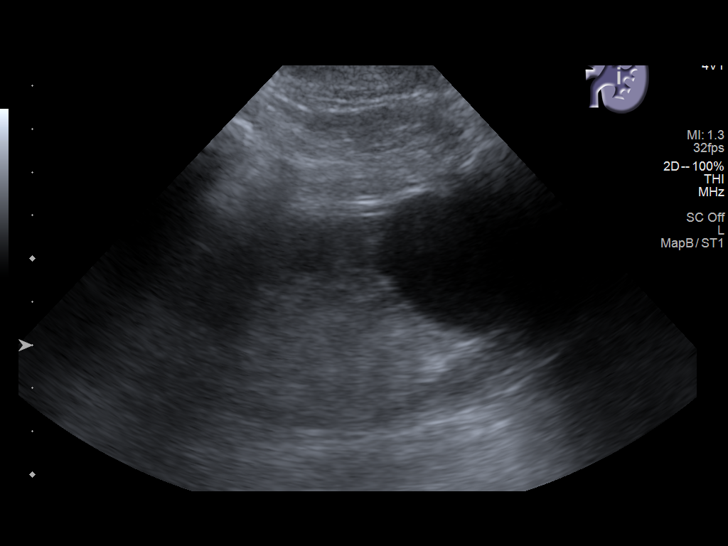
[im 36/43]
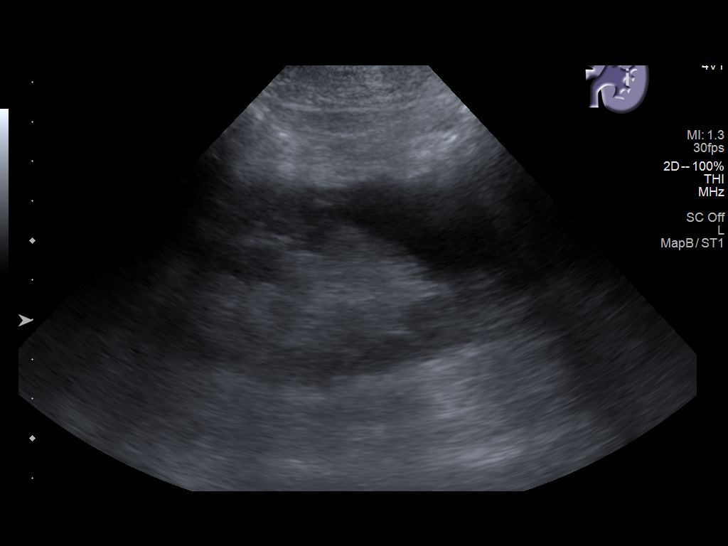
[im 39/43]
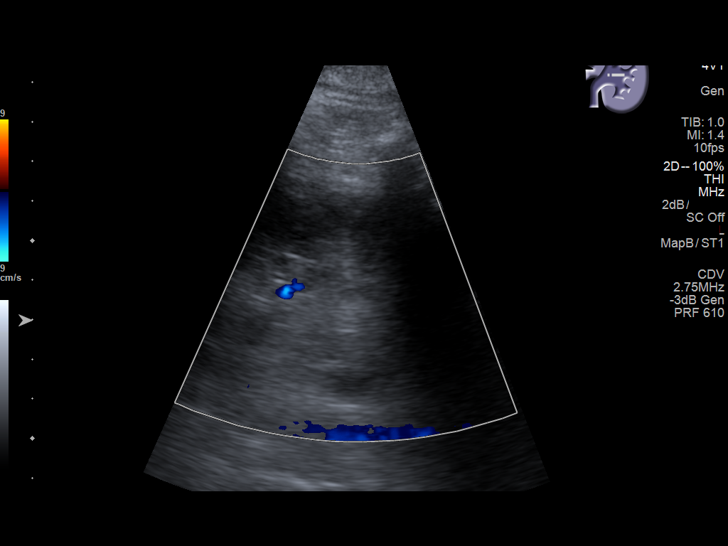
[im 43/43]
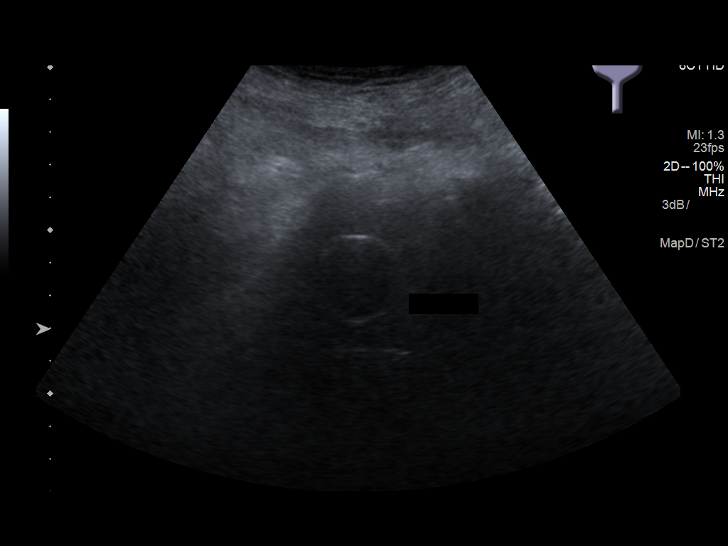

[14 of 25 positions shown; findings below may reference images not displayed]

FINDINGS: Right Kidney:

Length: 9.7 cm. Echogenicity within normal limits. Severe cortical
thinning. No hydronephrosis. Trace amount of perinephric fluid. Two
small anechoic lesions with increased through transmission in the
upper and interpolar regions, measuring up to 1.5 x 1.3 x 1.9 cm.

Left Kidney:

Length: 11.2 cm. Echogenicity within normal limits. In the upper
pole there is a 5.2 x 3.4 x 5.3 cm anechoic lesion with increased
through transmission, compatible with a simple cyst. No mass
hydronephrosis.

Bladder:

Completely decompressed with a Foley balloon catheter in place.
IMPRESSION: 1. No hydronephrosis.
2. Diffuse cortical thinning in the right kidney.
3. Multiple small simple cysts in both kidneys.

## 2019-06-09 IMAGING — DX DG CHEST 1V PORT
1 series · 1 of 1 positions shown · non-contrast
Comparison: Chest radiograph 11/24/2012

CLINICAL DATA: Fall

EXAM:
PORTABLE CHEST 1 VIEW

[chest ap]
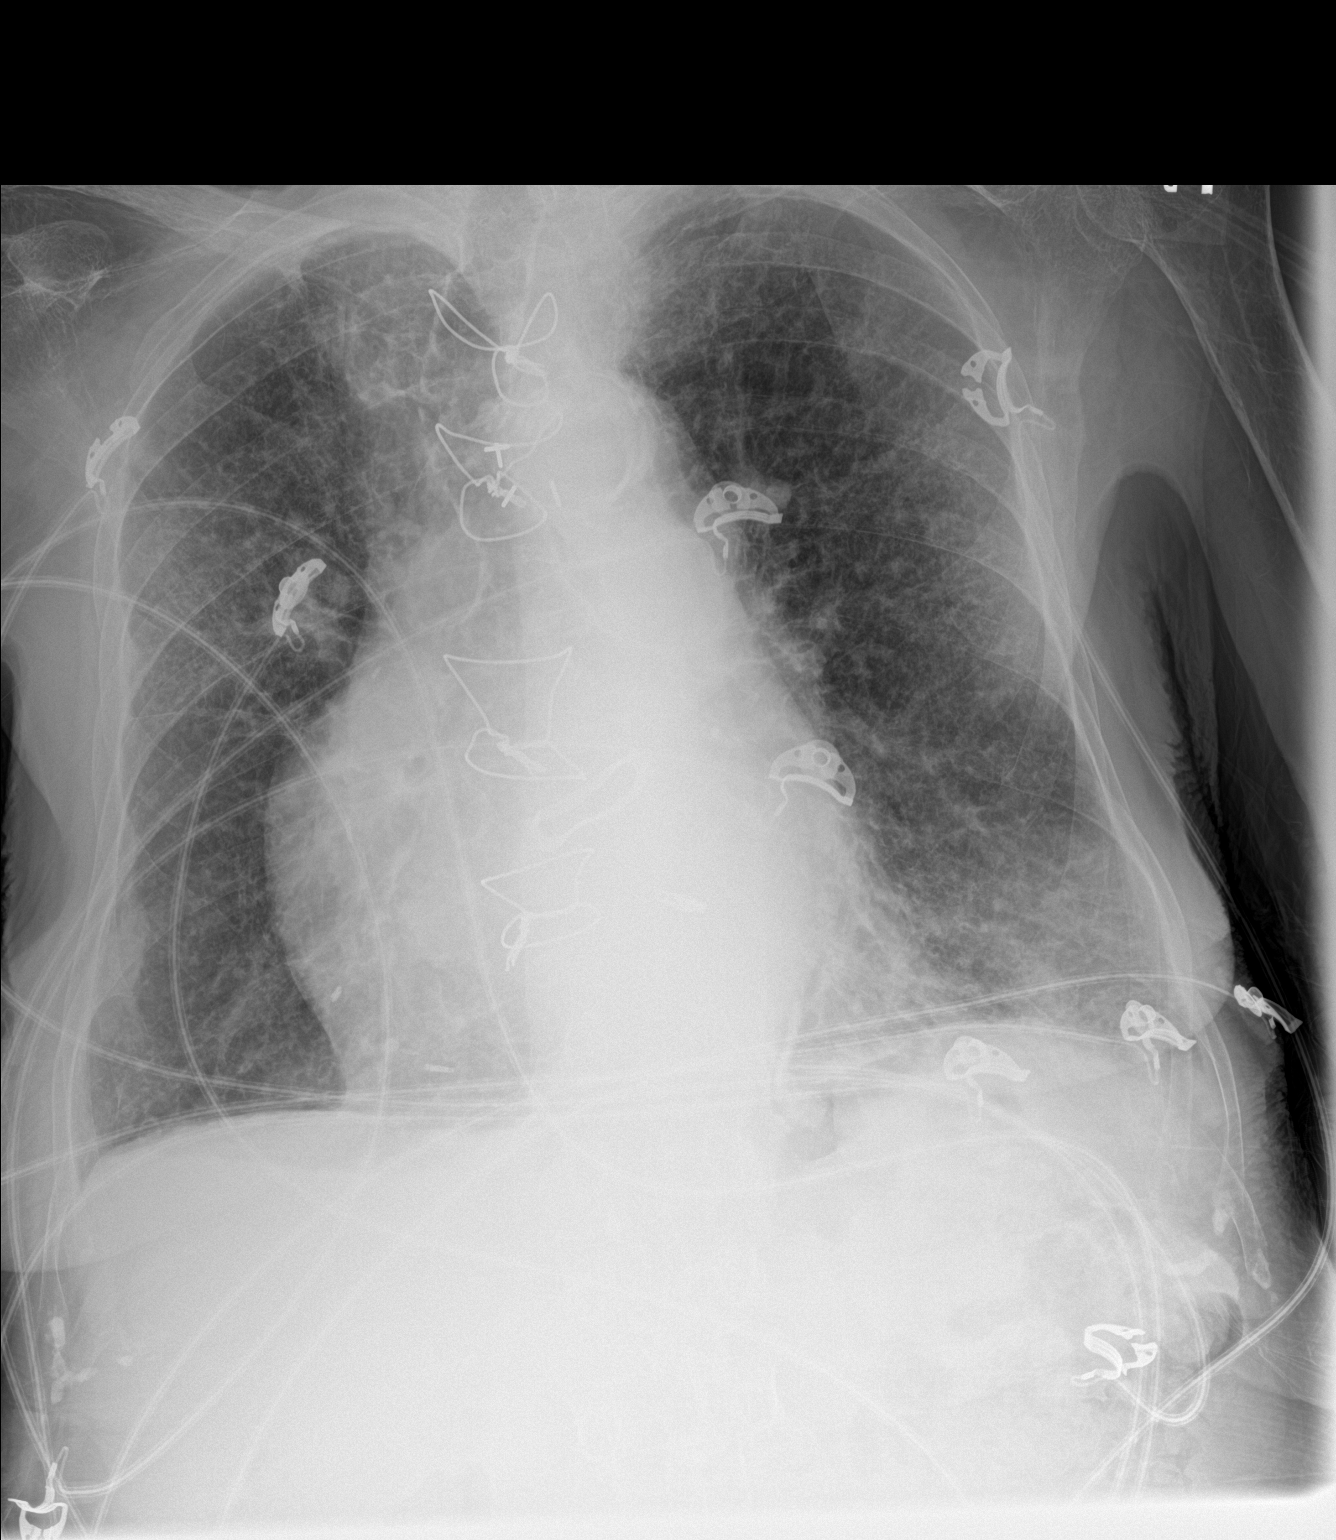

[1 of 1 positions shown; findings below may reference images not displayed]

FINDINGS: Cardiomegaly is worsened from the prior study. There bilateral
interstitial opacities, worst in the left lung base. No pneumothorax
or sizable pleural effusion. No focal consolidation.
IMPRESSION: Cardiomegaly, and calcific aortic atherosclerosis and suspected mild
interstitial edema.

## 2019-06-09 IMAGING — CR DG HIP (WITH OR WITHOUT PELVIS) 2-3V*L*
3 series · 3 of 3 positions shown · non-contrast
Comparison: None.

CLINICAL DATA: Left hip pain after falling

EXAM:
DG HIP (WITH OR WITHOUT PELVIS) 2-3V LEFT

[pelvis ap]
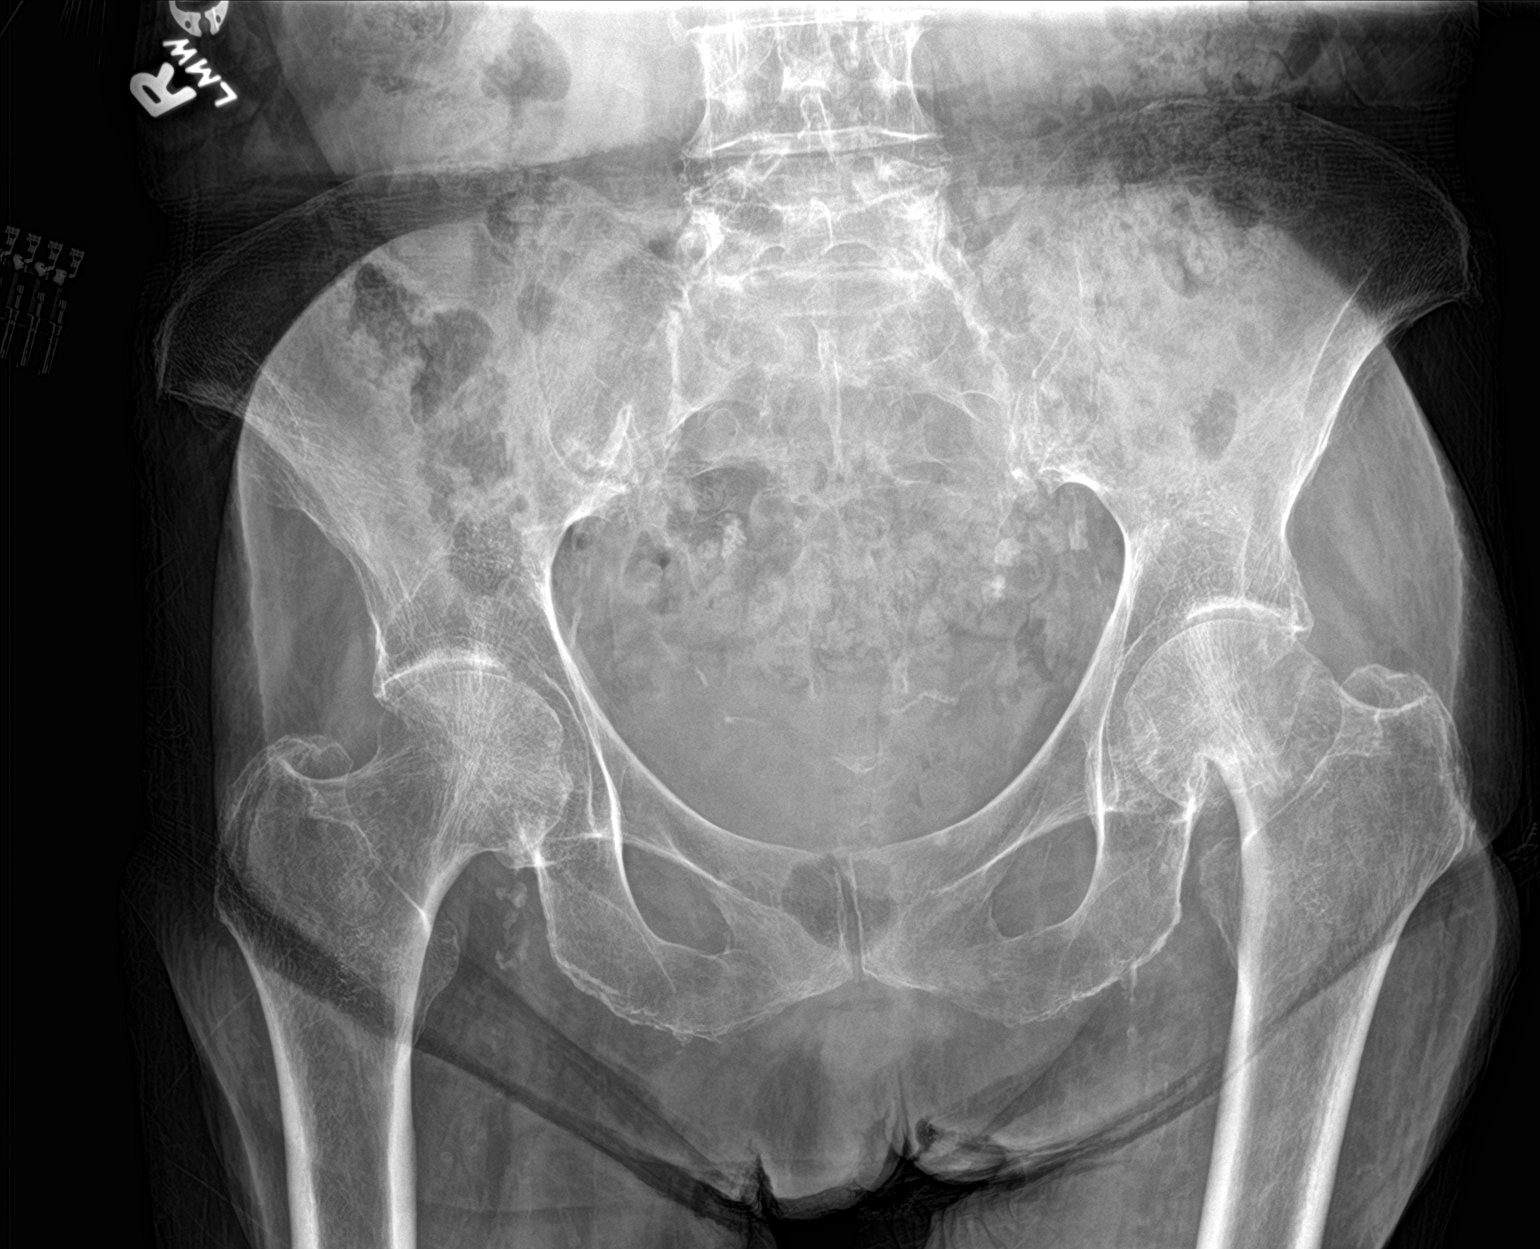

[hip ap]
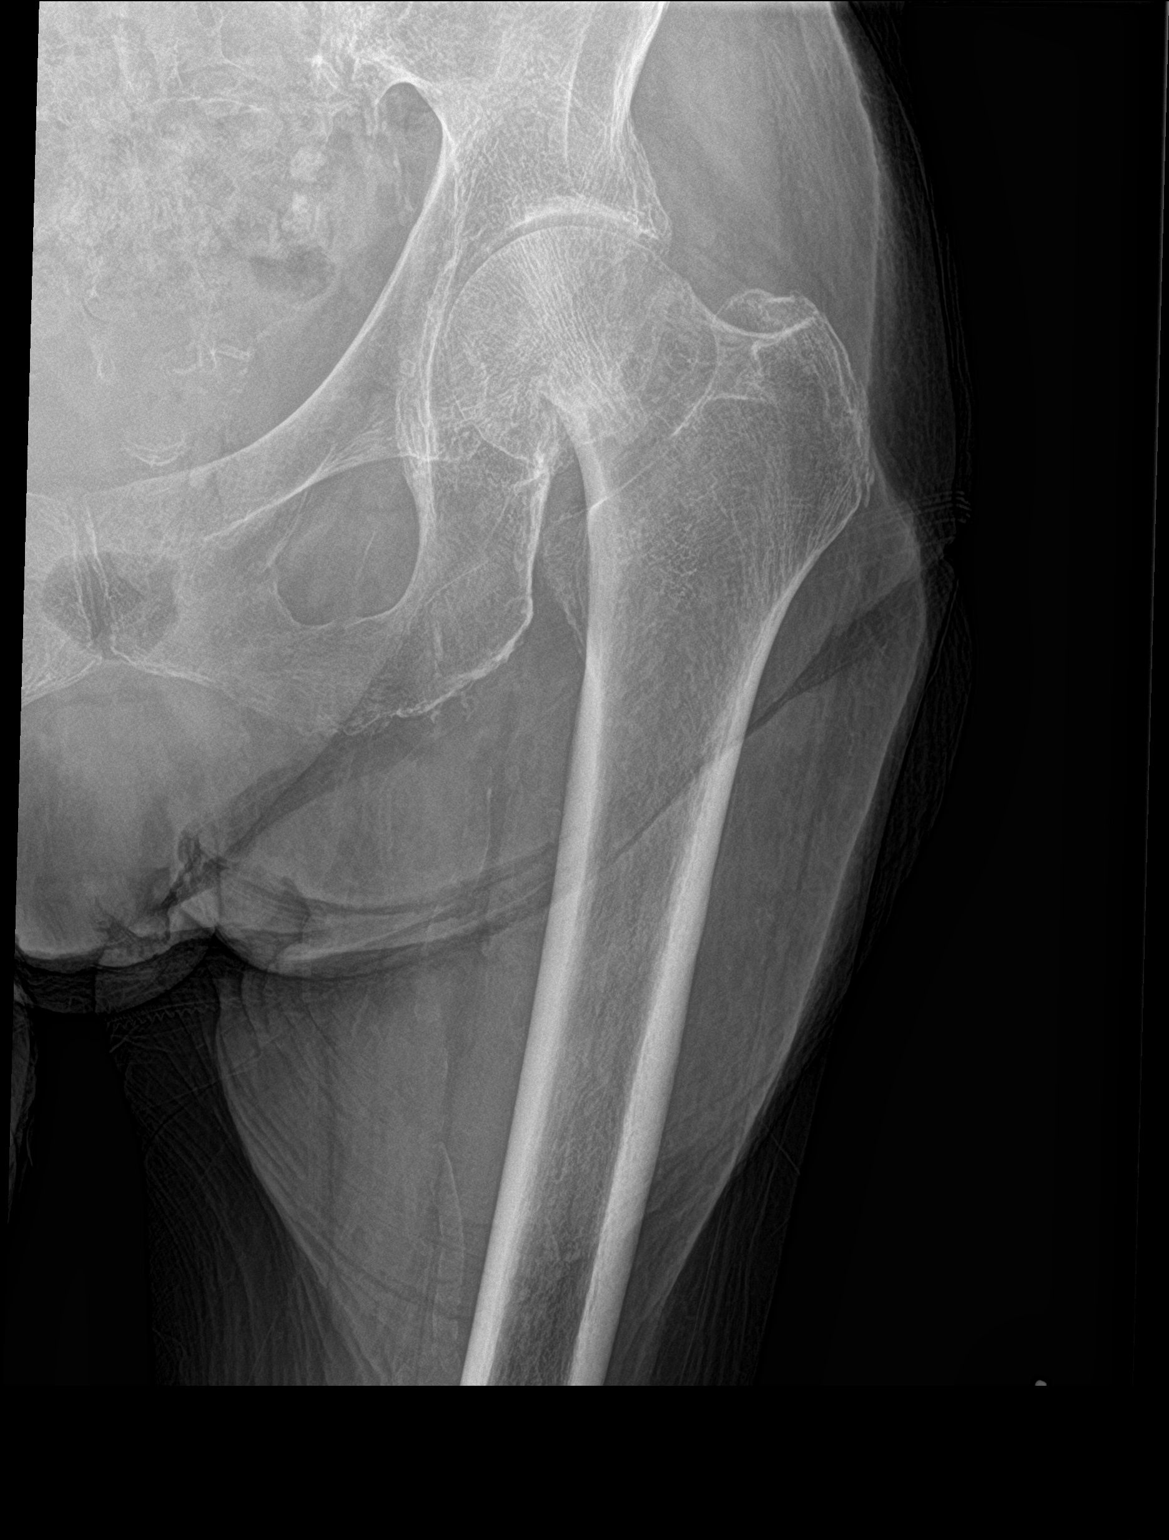

[hip lat]
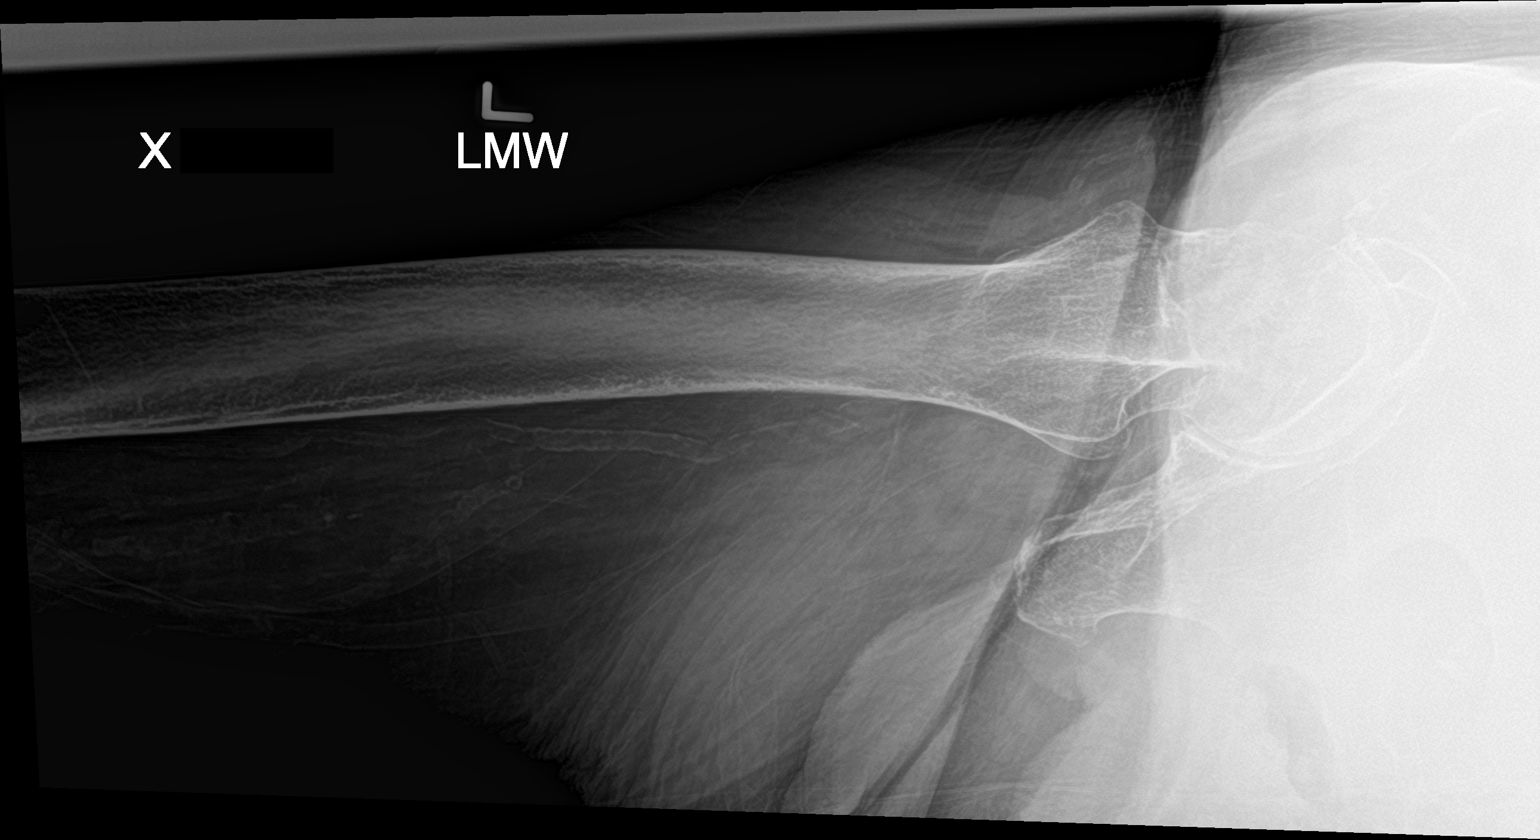

[3 of 3 positions shown; findings below may reference images not displayed]

FINDINGS: There is a transverse fracture of the left femoral neck with
associated foreshortening. The femoral head remains approximated to
the acetabulum. The right hip is unremarkable. No pelvic diastasis.
IMPRESSION: Transverse fracture of the left femoral neck.

## 2019-06-16 IMAGING — DX DG CHEST 1V PORT
1 series · 2 of 2 positions shown · non-contrast
Comparison: 05/03/2017.  05/02/2017.

CLINICAL DATA: Followup cough, congestion and wheezing.

EXAM:
PORTABLE CHEST 1 VIEW

[Series 1: chest ap · 0.14mm/px · 2 of 2 slices shown]
[im 1/2]
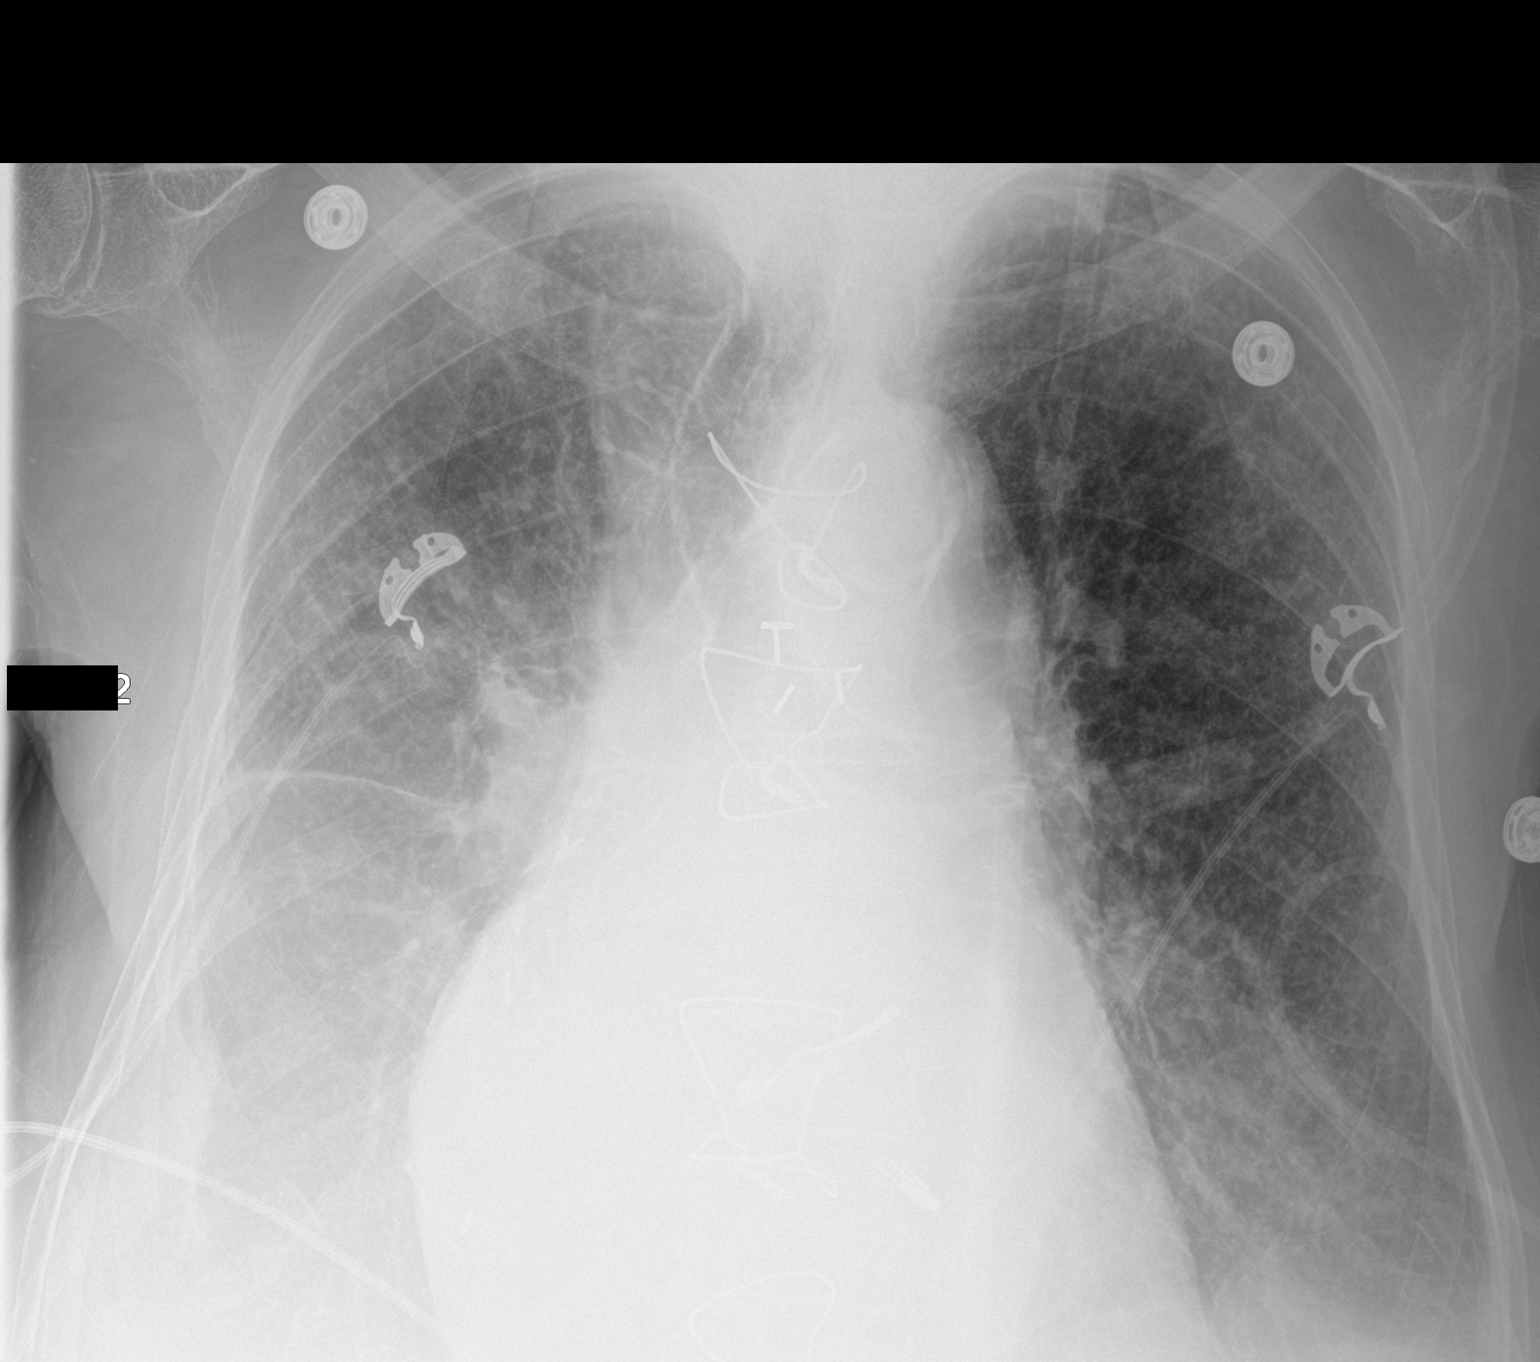
[im 2/2]
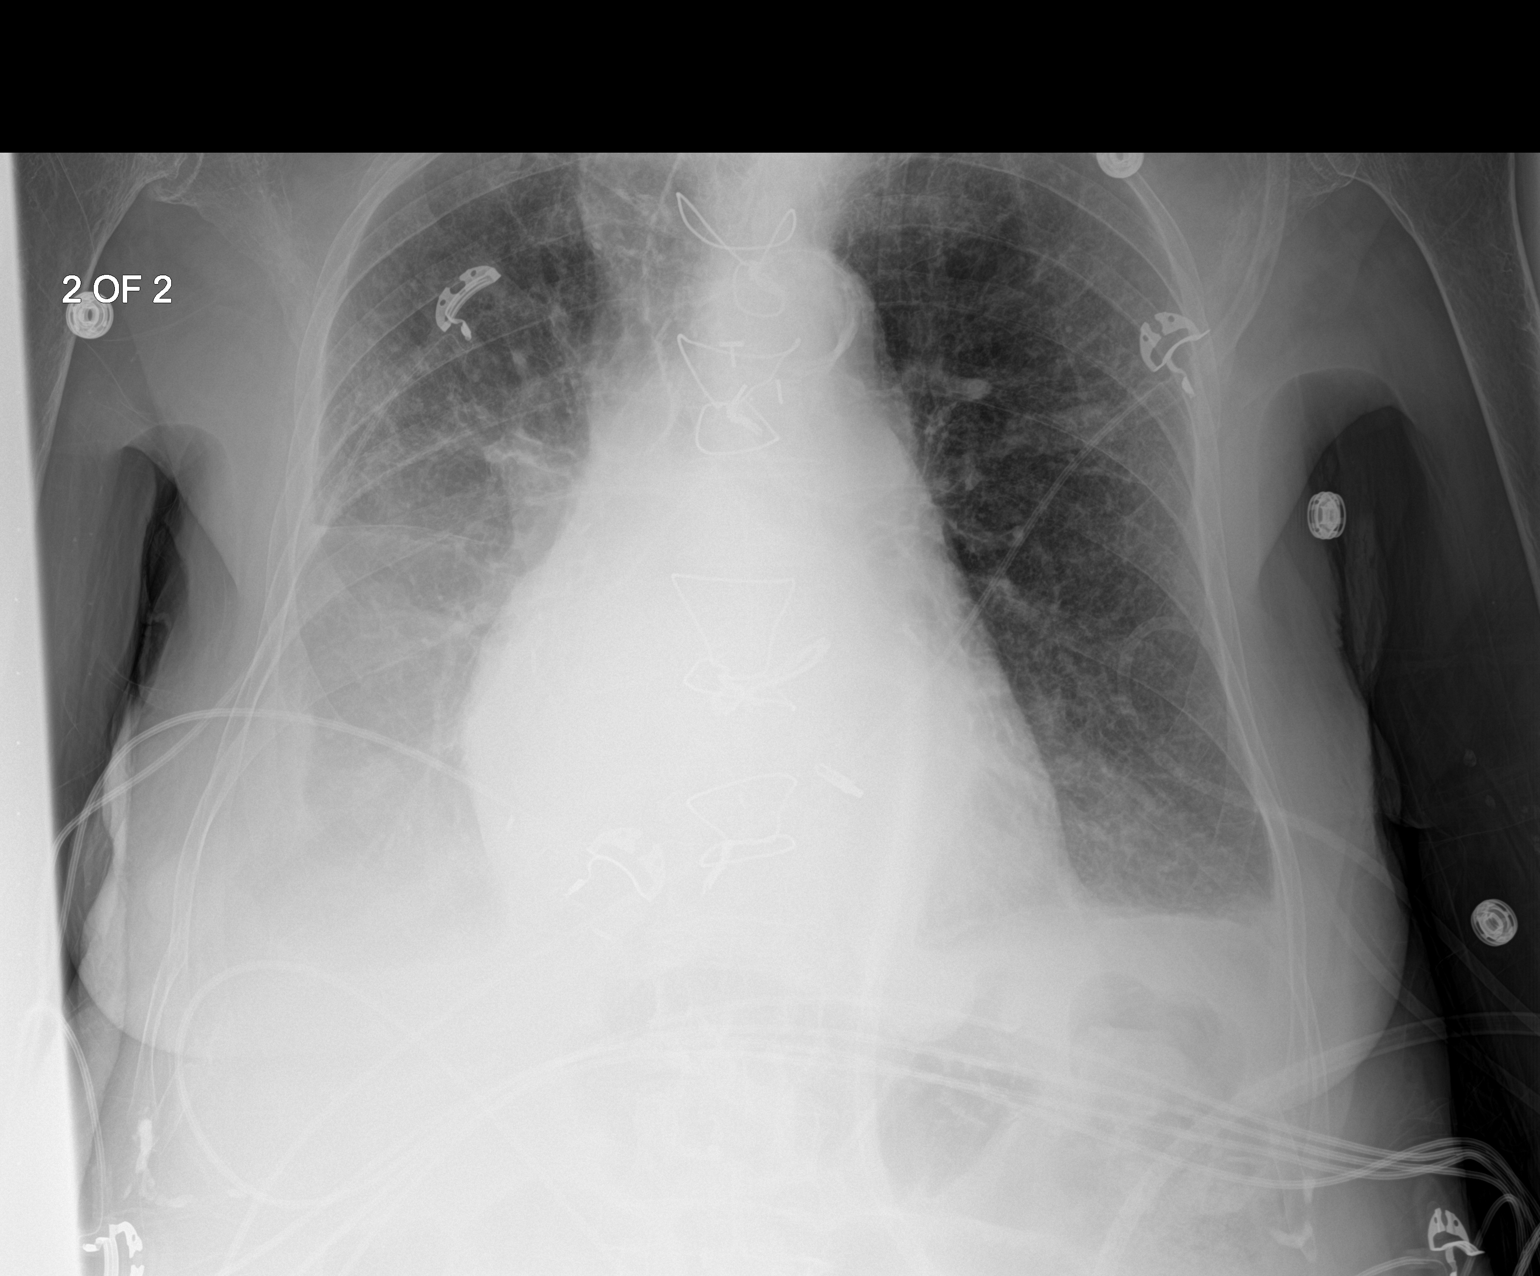

[2 of 2 positions shown; findings below may reference images not displayed]

FINDINGS: Lung bases excluded from the film. Previous median sternotomy.
Chronic cardiomegaly and aortic atherosclerosis. Enlarged right
effusion with right lung volume loss. Volume loss also at the left
base. Possible small or effusion on the left. Chronic interstitial
lung markings probably related to a combination of chronic lung
disease and mild edema.
IMPRESSION: Effusions, right larger than left, with volume loss right more than
left. Cardiomegaly and aortic atherosclerosis. Chronic lung disease
and likely mild interstitial edema.
